# Patient Record
Sex: Female | Born: 1945 | Race: White | Hispanic: No | Smoking: Former smoker
Health system: Southern US, Community
[De-identification: ages and names within clinical notes are randomized; demographics above are authoritative.]

## PROBLEM LIST (undated history)

## (undated) DIAGNOSIS — E119 Type 2 diabetes mellitus without complications: Secondary | ICD-10-CM

## (undated) DIAGNOSIS — F419 Anxiety disorder, unspecified: Secondary | ICD-10-CM

## (undated) DIAGNOSIS — R6250 Unspecified lack of expected normal physiological development in childhood: Secondary | ICD-10-CM

## (undated) DIAGNOSIS — I10 Essential (primary) hypertension: Secondary | ICD-10-CM

## (undated) DIAGNOSIS — Z862 Personal history of diseases of the blood and blood-forming organs and certain disorders involving the immune mechanism: Secondary | ICD-10-CM

## (undated) DIAGNOSIS — K219 Gastro-esophageal reflux disease without esophagitis: Secondary | ICD-10-CM

## (undated) DIAGNOSIS — I9789 Other postprocedural complications and disorders of the circulatory system, not elsewhere classified: Secondary | ICD-10-CM

## (undated) DIAGNOSIS — C20 Malignant neoplasm of rectum: Secondary | ICD-10-CM

## (undated) DIAGNOSIS — I4891 Unspecified atrial fibrillation: Secondary | ICD-10-CM

## (undated) DIAGNOSIS — Z9889 Other specified postprocedural states: Secondary | ICD-10-CM

## (undated) DIAGNOSIS — I739 Peripheral vascular disease, unspecified: Secondary | ICD-10-CM

## (undated) DIAGNOSIS — Z951 Presence of aortocoronary bypass graft: Secondary | ICD-10-CM

## (undated) DIAGNOSIS — Z952 Presence of prosthetic heart valve: Secondary | ICD-10-CM

## (undated) HISTORY — DX: Malignant neoplasm of rectum: C20

## (undated) HISTORY — DX: Essential (primary) hypertension: I10

## (undated) HISTORY — DX: Personal history of diseases of the blood and blood-forming organs and certain disorders involving the immune mechanism: Z86.2

## (undated) HISTORY — DX: Type 2 diabetes mellitus without complications: E11.9

## (undated) HISTORY — PX: ABDOMINAL HYSTERECTOMY: SHX81

## (undated) HISTORY — DX: Unspecified atrial fibrillation: I48.91

## (undated) HISTORY — DX: Other postprocedural complications and disorders of the circulatory system, not elsewhere classified: I97.89

## (undated) HISTORY — PX: COLONOSCOPY W/ BIOPSIES AND POLYPECTOMY: SHX1376

## (undated) HISTORY — DX: Other specified postprocedural states: Z98.890

---

## 2009-04-14 HISTORY — PX: LOW ANTERIOR BOWEL RESECTION: SUR1240

## 2009-07-20 ENCOUNTER — Ambulatory Visit: Admission: RE | Admit: 2009-07-20 | Discharge: 2009-09-25 | Payer: Self-pay | Admitting: Radiation Oncology

## 2011-07-03 ENCOUNTER — Inpatient Hospital Stay (HOSPITAL_COMMUNITY)
Admission: AD | Admit: 2011-07-03 | Discharge: 2011-07-16 | DRG: 216 | Disposition: A | Discharge status: Skilled Nursing Facility | Payer: Medicare Other | Source: Other Acute Inpatient Hospital | Attending: Thoracic Surgery (Cardiothoracic Vascular Surgery) | Admitting: Thoracic Surgery (Cardiothoracic Vascular Surgery)

## 2011-07-03 ENCOUNTER — Other Ambulatory Visit: Payer: Self-pay

## 2011-07-03 ENCOUNTER — Encounter (HOSPITAL_COMMUNITY): Payer: Self-pay | Admitting: General Practice

## 2011-07-03 DIAGNOSIS — I214 Non-ST elevation (NSTEMI) myocardial infarction: Principal | ICD-10-CM | POA: Diagnosis present

## 2011-07-03 DIAGNOSIS — D62 Acute posthemorrhagic anemia: Secondary | ICD-10-CM | POA: Diagnosis not present

## 2011-07-03 DIAGNOSIS — K219 Gastro-esophageal reflux disease without esophagitis: Secondary | ICD-10-CM | POA: Diagnosis present

## 2011-07-03 DIAGNOSIS — F411 Generalized anxiety disorder: Secondary | ICD-10-CM | POA: Diagnosis present

## 2011-07-03 DIAGNOSIS — I2510 Atherosclerotic heart disease of native coronary artery without angina pectoris: Secondary | ICD-10-CM | POA: Diagnosis present

## 2011-07-03 DIAGNOSIS — I4891 Unspecified atrial fibrillation: Secondary | ICD-10-CM

## 2011-07-03 DIAGNOSIS — Z7982 Long term (current) use of aspirin: Secondary | ICD-10-CM

## 2011-07-03 DIAGNOSIS — Q2579 Other congenital malformations of pulmonary artery: Secondary | ICD-10-CM

## 2011-07-03 DIAGNOSIS — I10 Essential (primary) hypertension: Secondary | ICD-10-CM | POA: Diagnosis present

## 2011-07-03 DIAGNOSIS — D649 Anemia, unspecified: Secondary | ICD-10-CM | POA: Diagnosis present

## 2011-07-03 DIAGNOSIS — E785 Hyperlipidemia, unspecified: Secondary | ICD-10-CM | POA: Diagnosis present

## 2011-07-03 DIAGNOSIS — E8779 Other fluid overload: Secondary | ICD-10-CM | POA: Diagnosis not present

## 2011-07-03 DIAGNOSIS — I359 Nonrheumatic aortic valve disorder, unspecified: Secondary | ICD-10-CM | POA: Diagnosis present

## 2011-07-03 DIAGNOSIS — Z87891 Personal history of nicotine dependence: Secondary | ICD-10-CM

## 2011-07-03 DIAGNOSIS — Z952 Presence of prosthetic heart valve: Secondary | ICD-10-CM

## 2011-07-03 DIAGNOSIS — E119 Type 2 diabetes mellitus without complications: Secondary | ICD-10-CM | POA: Diagnosis present

## 2011-07-03 DIAGNOSIS — Z79899 Other long term (current) drug therapy: Secondary | ICD-10-CM

## 2011-07-03 DIAGNOSIS — E669 Obesity, unspecified: Secondary | ICD-10-CM | POA: Diagnosis present

## 2011-07-03 DIAGNOSIS — Q288 Other specified congenital malformations of circulatory system: Secondary | ICD-10-CM

## 2011-07-03 DIAGNOSIS — Z7901 Long term (current) use of anticoagulants: Secondary | ICD-10-CM

## 2011-07-03 DIAGNOSIS — Z951 Presence of aortocoronary bypass graft: Secondary | ICD-10-CM

## 2011-07-03 HISTORY — DX: Presence of aortocoronary bypass graft: Z95.1

## 2011-07-03 HISTORY — DX: Gastro-esophageal reflux disease without esophagitis: K21.9

## 2011-07-03 HISTORY — DX: Peripheral vascular disease, unspecified: I73.9

## 2011-07-03 HISTORY — DX: Presence of prosthetic heart valve: Z95.2

## 2011-07-03 HISTORY — DX: Anxiety disorder, unspecified: F41.9

## 2011-07-03 HISTORY — DX: Unspecified lack of expected normal physiological development in childhood: R62.50

## 2011-07-03 LAB — COMPREHENSIVE METABOLIC PANEL
AST: 28 U/L (ref 0–37)
BUN: 15 mg/dL (ref 6–23)
CO2: 26 mEq/L (ref 19–32)
Chloride: 105 mEq/L (ref 96–112)
Creatinine, Ser: 1.11 mg/dL — ABNORMAL HIGH (ref 0.50–1.10)
GFR calc non Af Amer: 50 mL/min — ABNORMAL LOW (ref >90)
Glucose, Bld: 124 mg/dL — ABNORMAL HIGH (ref 70–99)
Total Bilirubin: 0.3 mg/dL (ref 0.3–1.2)

## 2011-07-03 LAB — CBC
HCT: 32.3 % — ABNORMAL LOW (ref 36.0–46.0)
Hemoglobin: 10.7 g/dL — ABNORMAL LOW (ref 12.0–15.0)
MCV: 94.7 fL (ref 78.0–100.0)
Platelets: 191 10*3/uL (ref 150–400)
RBC: 3.41 MIL/uL — ABNORMAL LOW (ref 3.87–5.11)
WBC: 6.2 10*3/uL (ref 4.0–10.5)

## 2011-07-03 LAB — CARDIAC PANEL(CRET KIN+CKTOT+MB+TROPI)
CK, MB: 7 ng/mL (ref 0.3–4.0)
Total CK: 138 U/L (ref 7–177)

## 2011-07-03 LAB — DIFFERENTIAL
Basophils Absolute: 0 10*3/uL (ref 0.0–0.1)
Basophils Relative: 0 % (ref 0–1)
Monocytes Absolute: 0.4 10*3/uL (ref 0.1–1.0)
Neutro Abs: 4.7 10*3/uL (ref 1.7–7.7)

## 2011-07-03 LAB — TSH: TSH: 2.755 u[IU]/mL (ref 0.350–4.500)

## 2011-07-03 LAB — GLUCOSE, CAPILLARY: Glucose-Capillary: 179 mg/dL — ABNORMAL HIGH (ref 70–99)

## 2011-07-03 LAB — APTT: aPTT: 32 seconds (ref 24–37)

## 2011-07-03 MED ORDER — ASPIRIN 300 MG RE SUPP: 300.0000 mg | RECTAL | Status: DC

## 2011-07-03 MED ORDER — FERROUS SULFATE 325 (65 FE) MG PO TABS
325.0000 mg | ORAL_TABLET | Freq: Every day | ORAL | Status: DC
  Administered 2011-07-03 – 2011-07-04 (×2): 325 mg via ORAL
  Filled 2011-07-03 (×3): qty 1

## 2011-07-03 MED ORDER — SODIUM CHLORIDE 0.9 % IJ SOLN: 3.0000 mL | INTRAMUSCULAR | Status: DC | PRN

## 2011-07-03 MED ORDER — BUMETANIDE 2 MG PO TABS
2.0000 mg | ORAL_TABLET | Freq: Every day | ORAL | Status: DC
  Administered 2011-07-03: 2 mg via ORAL
  Filled 2011-07-03 (×2): qty 1

## 2011-07-03 MED ORDER — SODIUM CHLORIDE 0.9 % IJ SOLN
3.0000 mL | Freq: Two times a day (BID) | INTRAMUSCULAR | Status: DC
  Administered 2011-07-04: 3 mL via INTRAVENOUS

## 2011-07-03 MED ORDER — HEPARIN (PORCINE) IN NACL 100-0.45 UNIT/ML-% IJ SOLN
1000.0000 [IU]/h | INTRAMUSCULAR | Status: DC
  Administered 2011-07-03 – 2011-07-04 (×3): 1000 [IU]/h via INTRAVENOUS
  Filled 2011-07-03 (×3): qty 250

## 2011-07-03 MED ORDER — VITAMIN D (ERGOCALCIFEROL) 1.25 MG (50000 UNIT) PO CAPS: 50000.0000 [IU] | ORAL_CAPSULE | ORAL | Status: DC

## 2011-07-03 MED ORDER — NITROGLYCERIN 0.4 MG SL SUBL: 0.4000 mg | SUBLINGUAL_TABLET | SUBLINGUAL | Status: DC | PRN

## 2011-07-03 MED ORDER — POTASSIUM CHLORIDE CRYS ER 10 MEQ PO TBCR
10.0000 meq | EXTENDED_RELEASE_TABLET | Freq: Every day | ORAL | Status: DC
  Administered 2011-07-03 – 2011-07-04 (×2): 10 meq via ORAL
  Filled 2011-07-03 (×2): qty 1

## 2011-07-03 MED ORDER — ONDANSETRON HCL 4 MG/2ML IJ SOLN: 4.0000 mg | Freq: Four times a day (QID) | INTRAMUSCULAR | Status: DC | PRN

## 2011-07-03 MED ORDER — NITROGLYCERIN IN D5W 200-5 MCG/ML-% IV SOLN
10.0000 ug/min | INTRAVENOUS | Status: DC
  Administered 2011-07-03: 10 ug/min via INTRAVENOUS
  Filled 2011-07-03: qty 250

## 2011-07-03 MED ORDER — SODIUM CHLORIDE 0.9 % IV SOLN: 250.0000 mL | INTRAVENOUS | Status: DC | PRN

## 2011-07-03 MED ORDER — METOPROLOL TARTRATE 25 MG PO TABS
25.0000 mg | ORAL_TABLET | Freq: Four times a day (QID) | ORAL | Status: DC
  Administered 2011-07-03 – 2011-07-04 (×4): 25 mg via ORAL
  Filled 2011-07-03 (×8): qty 1

## 2011-07-03 MED ORDER — INSULIN ASPART 100 UNIT/ML ~~LOC~~ SOLN
0.0000 [IU] | Freq: Three times a day (TID) | SUBCUTANEOUS | Status: DC
  Administered 2011-07-03: 3 [IU] via SUBCUTANEOUS
  Administered 2011-07-04: 2 [IU] via SUBCUTANEOUS

## 2011-07-03 MED ORDER — RALOXIFENE HCL 60 MG PO TABS
60.0000 mg | ORAL_TABLET | Freq: Every day | ORAL | Status: DC
  Administered 2011-07-03 – 2011-07-04 (×2): 60 mg via ORAL
  Filled 2011-07-03 (×2): qty 1

## 2011-07-03 MED ORDER — SODIUM CHLORIDE 0.45 % IV SOLN
INTRAVENOUS | Status: DC
  Administered 2011-07-03: 12:00:00 via INTRAVENOUS

## 2011-07-03 MED ORDER — ASPIRIN EC 81 MG PO TBEC
81.0000 mg | DELAYED_RELEASE_TABLET | Freq: Every day | ORAL | Status: DC
  Filled 2011-07-03: qty 1

## 2011-07-03 MED ORDER — SODIUM CHLORIDE 0.45 % IV SOLN
INTRAVENOUS | Status: DC
  Administered 2011-07-04: 03:00:00 via INTRAVENOUS

## 2011-07-03 MED ORDER — ACETAMINOPHEN 325 MG PO TABS: 650.0000 mg | ORAL_TABLET | ORAL | Status: DC | PRN

## 2011-07-03 MED ORDER — HEPARIN BOLUS VIA INFUSION
4000.0000 [IU] | Freq: Once | INTRAVENOUS | Status: AC
  Administered 2011-07-03: 4000 [IU] via INTRAVENOUS
  Filled 2011-07-03: qty 4000

## 2011-07-03 MED ORDER — GLIPIZIDE ER 10 MG PO TB24
10.0000 mg | ORAL_TABLET | Freq: Two times a day (BID) | ORAL | Status: DC
  Administered 2011-07-03: 10 mg via ORAL
  Filled 2011-07-03 (×4): qty 1

## 2011-07-03 MED ORDER — ASPIRIN 81 MG PO CHEW
324.0000 mg | CHEWABLE_TABLET | ORAL | Status: AC
  Administered 2011-07-04: 324 mg via ORAL
  Filled 2011-07-03: qty 4

## 2011-07-03 MED ORDER — DIAZEPAM 5 MG PO TABS
5.0000 mg | ORAL_TABLET | ORAL | Status: AC
  Administered 2011-07-04: 5 mg via ORAL
  Filled 2011-07-03: qty 1

## 2011-07-03 MED ORDER — SODIUM CHLORIDE 0.9 % IV SOLN
INTRAVENOUS | Status: DC
  Administered 2011-07-03: 12:00:00 via INTRAVENOUS

## 2011-07-03 MED ORDER — INSULIN ASPART 100 UNIT/ML ~~LOC~~ SOLN: 0.0000 [IU] | Freq: Every day | SUBCUTANEOUS | Status: DC

## 2011-07-03 MED ORDER — ASPIRIN 81 MG PO CHEW
324.0000 mg | CHEWABLE_TABLET | ORAL | Status: AC
  Administered 2011-07-03: 324 mg via ORAL
  Filled 2011-07-03: qty 4

## 2011-07-03 NOTE — Progress Notes (Signed)
RN called  office again and notified them of patient being on unit and needing orders.  Will continue to monitor. Melinda Vang

## 2011-07-03 NOTE — Progress Notes (Signed)
Pt and family in room. Discussed the risks and benefits of a cardiac catheterization including, but not limited to, death, stroke, MI, kidney damage and bleeding were discussed with the patient and her family who indicate understanding and agree to proceed.

## 2011-07-03 NOTE — H&P (Signed)
History and Physical  Patient ID: Melinda Vang Patient ID: Melinda Vang MRN: [id number], DOB/AGE: 1945/05/19 66 y.o. Date of Encounter: 07/03/2011  Primary Physician: Melinda Vang., MD, MD Primary Cardiologist:   Chief Complaint:  CP. Transferred from [facility] for NSTEMI.   HPI:  Pt is a 66 yo caucasian female who began to experience chest pressure, substernal chest pain with radiation into R jaw and Bilateral UE that began Tuesday night. It was initially a 10/10. She was unable to define the chest pain further. She also had associated dyspnea and nausea with vomiting. No diarrhea. She did not call anyone for help. Her family came by to check on her yesterday. Family member states she did not, "look well," and the patient admitted she was not feeling well and told the family member she was having the chest pain.  The decision was then made to take her to the [facility] ED where she was found to have elevated CE and afib with RVR which responded to lopressor.    After the Lopressor, she spontaneously converted to SR. When her cardiac enzymes came back elevated, she was transferred to [facility] for further evaluation and treatment. Currently, she is pain-free and denies nausea. She has no current complaints.  Past Medical History  Diagnosis Date  . Heart murmur   . Angina   . Hypertension   . Diabetes mellitus   . Peripheral vascular disease     per family  . Shortness of breath   . Anemia     hx of anemia  . Cancer 2011    rectal cancer history  . GERD (gastroesophageal reflux disease)   . Gout     hx of   . Anxiety/ developmental delay      Past Surgical History  Procedure Date  . Abdominal hysterectomy   . Colonoscopy w/ biopsies and polypectomy    I have reviewed the patient's current medications. Prior to Admission:  Prescriptions prior to admission  Medication Sig Dispense Refill  . atenolol (TENORMIN) 50 MG tablet Take 50-75 mg by mouth daily. Monday,  Wednesday, Friday, Sunday-1.5 tablets Tuesday, Thursday, Saturday-1 tablet      . bumetanide (BUMEX) 1 MG tablet Take 2 mg by mouth daily.      . ferrous sulfate 325 (65 FE) MG tablet Take 325 mg by mouth daily with breakfast.      . glipiZIDE (GLUCOTROL XL) 10 MG 24 hr tablet Take 10 mg by mouth 2 (two) times daily.      . metFORMIN (GLUCOPHAGE) 1000 MG tablet Take 1,000 mg by mouth daily.      . potassium chloride (K-DUR,KLOR-CON) 10 MEQ tablet Take 10 mEq by mouth daily.      . raloxifene (EVISTA) 60 MG tablet Take 60 mg by mouth daily.      . Vitamin D, Ergocalciferol, (DRISDOL) 50000 UNITS CAPS Take 50,000 Units by mouth every 7 (seven) days. mondays       Scheduled:   . aspirin  324 mg Oral NOW  . aspirin EC  81 mg Oral Daily  . metoprolol tartrate  25 mg Oral QID  . DISCONTD: aspirin  300 mg Rectal NOW   Allergies: NKDA History   Social History  . Marital Status: Divorced    Spouse Name: N/A    Number of Children: N/A  . Years of Education: N/A   Occupational History  . Not on file.   Social History Main Topics  . Smoking status:  Former Smoker -- 1.0 packs/day for 20 years  . Smokeless tobacco: Never Used   Comment: Quit > 20 years ago  . Alcohol Use: No  . Drug Use: No  . Sexually Active: No   Other Topics Concern  . Not on file   Social History Narrative   Pt lives alone and family checks on her daily.     Family history: Both parents are deceased, no family history of premature coronary artery disease.  Review of Systems: Pt not able to answer questions very well. All history obtainable is listed in the [facility] above and the family does not report any other recent medical problems or issues. Her abdomen may be a little larger than usual. She has a history of non-compliance with meds and has not taken meds in the last few days.  Full 14-point review of systems otherwise negative except as noted above.   Physical Exam: Height 4\' 10"  (1.473 m), weight 172 lb 6.4  oz (78.2 kg). General: Pale, appears tired, well developed, obese, female in no acute distress. Head: Normocephalic, atraumatic, sclera non-icteric, no xanthomas, nares are without discharge. Dentition: poor Neck: Supple. No carotid bruits. JVD not elevated. No thyromegaly. Trachea midline, no cervical lymphadenopathy. Lungs: Diminshed, but clear bilaterally to auscultation without wheezes or rhonchi. Equal and bilateral expansion. Heart: Prominent systolic murmur, nearly obliterates 3/6 and loudest at the upper right sternal border. Regular rate and rhythm with S1 S2. No S3 or S4.  No rubs, or gallops appreciated. Abdomen: Obese, rotund, soft, non-tender, with normoactive bowel sounds. No hepatomegaly. No rebound/guarding. No obvious abdominal masses. Msk:  Strength and tone appear normal for age. No joint deformities or effusions, no spine or costo-vertebral angle tenderness. Extremities: 2+  edema to BLE with erythrema and warm to touch, R>L. No clubbing or cyanosis.  Distal pedal pulses are 2+ and equal bilaterally. Neuro: Lethargic, but arousable oriented X 3. Moves all extremities spontaneously. No focal deficits noted. Psych:  Responds to questions appropriately with a normal affect. Logical thoughts and normal tone and timbre of speech. Skin: Thin and fragile with the aforementioned BLE alterations.  Labs: pending    Radiology/Studies: done at [facility], venous congestion and left basilar atelectasis    Echo:  pending  EKG: done at [facility], SR, rate 72 with diffuse inferolateral ST depression and T wave changes.  ASSESSMENT AND PLAN:  1. NSTEMI: Admit, cont to cycle enzymes, give ASA, cont BB, add statin once lipid profile reviewed if LFTs OK. Cath today if labs stable.  2. DM - hold glucophage, add SSI, cont glucotrol, ck A1C  3. Possible cellulitis - no fever, WBC's pending. Follow for now, may need ABX.  4. Otherwise, continue home Rx except Bumex for now.   Signed,  Melinda Marker Barrett  PA-C 07/03/2011, 9:52 AM  Patient seen and examined.  Agree with findings as noted above by R. Vang.   66 year old with a history of DM now with CP that began 2 nights ago.  Currently pain free. Was in afib.  Now back in SR. Denies SOB  Denies CP. Exam with evid of LE edema, ? Early cellulitis (family says her legs are always red) EKGs from [facility] show Afib, ST changes consistent with ischemia.  Now in SR.    Labs signif for positive troponin, positive CK/MB consistent with NSTEMI  Impression  NSTEMI Began 36 hr ago.  Currently pain free I would continue heparin.  Continue b blocker.   Labs to be drawn  her to evaluate renal function.  WIll begin hydration Start heparin. Probable cath in AM to optimize, sooner if CP recurs  Echo to evaluate murmur.  Get old records from Dr. Woody Seller.  Dorris Carnes 1:27 PM 07/03/2011

## 2011-07-03 NOTE — Progress Notes (Signed)
CRITICAL VALUE ALERT  Critical value received:  CK-MB 7.0 & troponin 4.32  Date of notification: 07/03/11    Time of notification:  6:40 PM   Critical value read back:yes  Nurse who received alert:  Lina Sar   MD notified (1st page):  Merita Norton, PA  Time of first page:  6:40 PM   MD notified (2nd page):6:40 PM   Time of second page:  Responding MD:  Merita Norton, PA  Time MD responded:  6:40 PM

## 2011-07-03 NOTE — Progress Notes (Signed)
Patient arrived via EMS from [facility] and settled/stable in room.  [facility] notified of patients arrival.  Will continue to monitor. Lina Sar

## 2011-07-03 NOTE — Progress Notes (Signed)
ANTICOAGULATION CONSULT NOTE - Follow Up Consult  Pharmacy Consult for Heparin Indication: atrial fibrillation, NSTEMI  No Known Allergies  Patient Measurements: Height: 4\' 10"  (147.3 cm) Weight: 172 lb 6.4 oz (78.2 kg) IBW/kg (Calculated) : 40.9  Heparin Dosing Weight: 63 kg  Vital Signs: Temp: 98.9 F (37.2 C) (03/21 1433) Temp src: Axillary (03/21 1400) BP: 157/77 mmHg (03/21 1746) Pulse Rate: 85  (03/21 1746)  Labs:  Basename 07/03/11 2013 07/03/11 1729 07/03/11 1250  HGB -- -- 10.7*  HCT -- -- 32.3*  PLT -- -- 191  APTT -- -- 32  LABPROT -- -- 14.5  INR -- -- 1.11  HEPARINUNFRC 0.62 -- --  CREATININE -- -- 1.11*  CKTOTAL -- 138 --  CKMB -- 7.0* --  TROPONINI -- 4.32* --   Estimated Creatinine Clearance: 43.3 ml/min (by C-G formula based on Cr of 1.11).   Medications:  Infusions:    . sodium chloride 50 mL/hr at 07/03/11 1209  . sodium chloride    . heparin 1,000 Units/hr (07/03/11 1354)  . nitroGLYCERIN 10 mcg/min (07/03/11 1737)  . DISCONTD: sodium chloride 75 mL/hr at 07/03/11 1206    Assessment: Atrial Fibrillation, NSTEMI: Currently on Heparin with an initial therapeutic Heparin level.  Goal of Therapy:  Heparin level 0.3-0.7 units/ml   Plan:  Continue Heparin at 1000 units/hr Check AM Heparin level and CBC.  Legrand Como, Pharm.D., BCPS Clinical Pharmacist  Pager [phone] 07/03/2011, 9:06 PM

## 2011-07-03 NOTE — Progress Notes (Signed)
Pt C/O of CP after eating lunch.  VS &EKG obtained.  Pt unable to verbalize pain scale and descriptors. PA notified and will continue to monitor. Lina Sar

## 2011-07-03 NOTE — Progress Notes (Signed)
ANTICOAGULATION CONSULT NOTE - Initial Consult  Pharmacy Consult for Heparin Indication: Afib, NSTEMI  No Known Allergies  Patient Measurements: Height: 4\' 10"  (147.3 cm) Weight: 172 lb 6.4 oz (78.2 kg) IBW/kg (Calculated) : 40.9  Heparin Dosing Weight: 63 kg  Vital Signs: Temp: 98.8 F (37.1 C) (03/21 0730) Temp src: Axillary (03/21 0730) BP: 138/64 mmHg (03/21 0730) Pulse Rate: 81  (03/21 0730)  Labs: No results found for this basename: HGB:2,HCT:3,PLT:3,APTT:3,LABPROT:3,INR:3,HEPARINUNFRC:3,CREATININE:3,CKTOTAL:3,CKMB:3,TROPONINI:3 in the last 72 hours CrCl is unknown because no creatinine reading has been taken.  Medical History: Past Medical History  Diagnosis Date  . Heart murmur   . Angina   . Hypertension   . Diabetes mellitus   . Peripheral vascular disease     per family  . Shortness of breath   . Anemia     hx of anemia  . Cancer     rectal cancer history  . GERD (gastroesophageal reflux disease)   . Gout     hx of   . Anxiety   . Developmental delay     Assessment: Transferred from [facility] for NSTEMI and afib. Last LMWH noted around midnight.  Cards: heart murmur, angina, HTN, PVD Meds: ASA 81mg , Bumex, metoprolol, K  GI/Nutriton: h/o GERD Meds: FESO4, K, Evista, Vit D  Endo: DM, gout (hold glucophage) Meds: glipizide, SSI  Heme/Onc: h/o anemia and rectal cancer on FESO4  Neuro: anxiety, developmental delay  Goal of Therapy:  Heparin level 0.3-0.7 units/ml   Plan:  Start IV heparin with bolus 4000 units with infusion 1000 units/hr. Check heparin level in 6 hrs and daily.  Alford Highland, [employer] 07/03/2011,12:35 PM

## 2011-07-04 ENCOUNTER — Encounter (HOSPITAL_COMMUNITY): Payer: Self-pay | Admitting: Anesthesiology

## 2011-07-04 ENCOUNTER — Encounter (HOSPITAL_COMMUNITY)
Admission: AD | Disposition: A | Discharge status: Skilled Nursing Facility | Payer: Self-pay | Source: Other Acute Inpatient Hospital | Attending: Thoracic Surgery (Cardiothoracic Vascular Surgery)

## 2011-07-04 ENCOUNTER — Encounter (HOSPITAL_COMMUNITY): Payer: Self-pay | Admitting: Thoracic Surgery (Cardiothoracic Vascular Surgery)

## 2011-07-04 ENCOUNTER — Inpatient Hospital Stay (HOSPITAL_COMMUNITY): Payer: Medicare Other | Admitting: Anesthesiology

## 2011-07-04 ENCOUNTER — Encounter (HOSPITAL_COMMUNITY): Payer: Self-pay | Admitting: Certified Registered"

## 2011-07-04 DIAGNOSIS — I4891 Unspecified atrial fibrillation: Secondary | ICD-10-CM

## 2011-07-04 DIAGNOSIS — I2510 Atherosclerotic heart disease of native coronary artery without angina pectoris: Secondary | ICD-10-CM

## 2011-07-04 DIAGNOSIS — E785 Hyperlipidemia, unspecified: Secondary | ICD-10-CM | POA: Diagnosis present

## 2011-07-04 DIAGNOSIS — Z951 Presence of aortocoronary bypass graft: Secondary | ICD-10-CM

## 2011-07-04 DIAGNOSIS — I359 Nonrheumatic aortic valve disorder, unspecified: Secondary | ICD-10-CM

## 2011-07-04 DIAGNOSIS — D649 Anemia, unspecified: Secondary | ICD-10-CM | POA: Diagnosis present

## 2011-07-04 HISTORY — PX: MYOMECTOMY: SHX85

## 2011-07-04 HISTORY — DX: Presence of aortocoronary bypass graft: Z95.1

## 2011-07-04 HISTORY — PX: LEFT HEART CATHETERIZATION WITH CORONARY ANGIOGRAM: SHX5451

## 2011-07-04 HISTORY — PX: CORONARY ARTERY BYPASS GRAFT: SHX141

## 2011-07-04 HISTORY — PX: AORTIC VALVE REPLACEMENT: SHX41

## 2011-07-04 LAB — POCT I-STAT 4, (NA,K, GLUC, HGB,HCT)
Glucose, Bld: 114 mg/dL — ABNORMAL HIGH (ref 70–99)
Glucose, Bld: 131 mg/dL — ABNORMAL HIGH (ref 70–99)
Glucose, Bld: 228 mg/dL — ABNORMAL HIGH (ref 70–99)
HCT: 27 % — ABNORMAL LOW (ref 36.0–46.0)
HCT: 21 % — ABNORMAL LOW (ref 36.0–46.0)
HCT: 21 % — ABNORMAL LOW (ref 36.0–46.0)
Hemoglobin: 7.5 g/dL — ABNORMAL LOW (ref 12.0–15.0)
Hemoglobin: 7.1 g/dL — ABNORMAL LOW (ref 12.0–15.0)
Potassium: 3.5 mEq/L (ref 3.5–5.1)
Potassium: 3.5 mEq/L (ref 3.5–5.1)
Potassium: 4.3 mEq/L (ref 3.5–5.1)
Sodium: 139 mEq/L (ref 135–145)
Sodium: 139 mEq/L (ref 135–145)
Sodium: 137 mEq/L (ref 135–145)
Sodium: 139 mEq/L (ref 135–145)

## 2011-07-04 LAB — CBC
Hemoglobin: 10.5 g/dL — ABNORMAL LOW (ref 12.0–15.0)
MCHC: 34.2 g/dL (ref 30.0–36.0)
Platelets: 184 10*3/uL (ref 150–400)
Platelets: 99 10*3/uL — ABNORMAL LOW (ref 150–400)
RBC: 3.33 MIL/uL — ABNORMAL LOW (ref 3.87–5.11)
RDW: 14 % (ref 11.5–15.5)
WBC: 6.8 10*3/uL (ref 4.0–10.5)

## 2011-07-04 LAB — LIPID PANEL
LDL Cholesterol: 101 mg/dL — ABNORMAL HIGH (ref 0–99)
Triglycerides: 257 mg/dL — ABNORMAL HIGH (ref <150)
VLDL: 51 mg/dL — ABNORMAL HIGH (ref 0–40)

## 2011-07-04 LAB — CARDIAC PANEL(CRET KIN+CKTOT+MB+TROPI)
CK, MB: 2.7 ng/mL (ref 0.3–4.0)
Relative Index: INVALID (ref 0.0–2.5)
Total CK: 79 U/L (ref 7–177)
Troponin I: 2.44 ng/mL (ref <0.30)
Troponin I: 1.88 ng/mL (ref <0.30)

## 2011-07-04 LAB — HEMOGLOBIN AND HEMATOCRIT, BLOOD
HCT: 24.9 % — ABNORMAL LOW (ref 36.0–46.0)
Hemoglobin: 8.4 g/dL — ABNORMAL LOW (ref 12.0–15.0)

## 2011-07-04 LAB — POCT I-STAT 3, ART BLOOD GAS (G3+)
Bicarbonate: 24.7 mEq/L — ABNORMAL HIGH (ref 20.0–24.0)
TCO2: 26 mmol/L (ref 0–100)
pH, Arterial: 7.385 (ref 7.350–7.400)

## 2011-07-04 LAB — DIC (DISSEMINATED INTRAVASCULAR COAGULATION)PANEL
INR: 1.6 — ABNORMAL HIGH (ref 0.00–1.49)
Prothrombin Time: 19.3 seconds — ABNORMAL HIGH (ref 11.6–15.2)

## 2011-07-04 LAB — IRON AND TIBC
Saturation Ratios: 18 % — ABNORMAL LOW (ref 20–55)
UIBC: 217 ug/dL (ref 125–400)

## 2011-07-04 LAB — PLATELET COUNT: Platelets: 83 10*3/uL — ABNORMAL LOW (ref 150–400)

## 2011-07-04 LAB — ABO/RH: ABO/RH(D): O POS

## 2011-07-04 LAB — RETICULOCYTES
RBC.: 3.48 MIL/uL — ABNORMAL LOW (ref 3.87–5.11)
Retic Count, Absolute: 83.5 10*3/uL (ref 19.0–186.0)

## 2011-07-04 LAB — GLUCOSE, CAPILLARY: Glucose-Capillary: 150 mg/dL — ABNORMAL HIGH (ref 70–99)

## 2011-07-04 LAB — FERRITIN: Ferritin: 75 ng/mL (ref 10–291)

## 2011-07-04 SURGERY — CORONARY ARTERY BYPASS GRAFTING (CABG)
Anesthesia: General | Site: Chest | Wound class: Clean

## 2011-07-04 SURGERY — LEFT HEART CATHETERIZATION WITH CORONARY ANGIOGRAM
Anesthesia: LOCAL

## 2011-07-04 MED ORDER — NITROGLYCERIN IN D5W 200-5 MCG/ML-% IV SOLN
INTRAVENOUS | Status: DC | PRN
  Administered 2011-07-04: 16.6 ug/min via INTRAVENOUS

## 2011-07-04 MED ORDER — VECURONIUM BROMIDE 10 MG IV SOLR
INTRAVENOUS | Status: DC | PRN
  Administered 2011-07-04: 10 mg via INTRAVENOUS

## 2011-07-04 MED ORDER — DEXTROSE 5 % IV SOLN
1.5000 g | INTRAVENOUS | Status: DC
  Administered 2011-07-04: 1.5 g via INTRAVENOUS
  Filled 2011-07-04: qty 1.5

## 2011-07-04 MED ORDER — DOPAMINE-DEXTROSE 3.2-5 MG/ML-% IV SOLN
INTRAVENOUS | Status: DC | PRN
  Administered 2011-07-04: 5 ug/kg/min via INTRAVENOUS

## 2011-07-04 MED ORDER — HEPARIN (PORCINE) IN NACL 2-0.9 UNIT/ML-% IJ SOLN
INTRAMUSCULAR | Status: AC
  Filled 2011-07-04: qty 2000

## 2011-07-04 MED ORDER — MIDAZOLAM HCL 2 MG/2ML IJ SOLN
INTRAMUSCULAR | Status: AC
  Filled 2011-07-04: qty 2

## 2011-07-04 MED ORDER — SODIUM CHLORIDE 0.9 % IV SOLN
0.1000 ug/kg/h | INTRAVENOUS | Status: DC
  Filled 2011-07-04 (×2): qty 4

## 2011-07-04 MED ORDER — FENTANYL CITRATE 0.05 MG/ML IJ SOLN
INTRAMUSCULAR | Status: DC | PRN
  Administered 2011-07-04 (×2): 250 ug via INTRAVENOUS
  Administered 2011-07-04: 150 ug via INTRAVENOUS
  Administered 2011-07-04: 250 ug via INTRAVENOUS
  Administered 2011-07-04: 650 ug via INTRAVENOUS
  Administered 2011-07-04: 250 ug via INTRAVENOUS
  Administered 2011-07-04: 100 ug via INTRAVENOUS
  Administered 2011-07-04: 250 ug via INTRAVENOUS
  Administered 2011-07-04: 150 ug via INTRAVENOUS
  Administered 2011-07-04: 100 ug via INTRAVENOUS
  Administered 2011-07-04: 50 ug via INTRAVENOUS
  Administered 2011-07-04: 250 ug via INTRAVENOUS
  Administered 2011-07-04: 50 ug via INTRAVENOUS
  Administered 2011-07-04: 250 ug via INTRAVENOUS

## 2011-07-04 MED ORDER — AMIODARONE HCL IN DEXTROSE 360-4.14 MG/200ML-% IV SOLN
INTRAVENOUS | Status: DC | PRN
  Administered 2011-07-04: 60 mg/h via INTRAVENOUS

## 2011-07-04 MED ORDER — VERAPAMIL HCL 2.5 MG/ML IV SOLN
INTRAVENOUS | Status: AC
  Administered 2011-07-04: 16:00:00
  Filled 2011-07-04: qty 2.5

## 2011-07-04 MED ORDER — VERAPAMIL HCL 2.5 MG/ML IV SOLN
INTRAVENOUS | Status: DC
  Filled 2011-07-04: qty 2.5

## 2011-07-04 MED ORDER — METOPROLOL TARTRATE 1 MG/ML IV SOLN
5.0000 mg | Freq: Once | INTRAVENOUS | Status: AC
  Administered 2011-07-04: 5 mg via INTRAVENOUS

## 2011-07-04 MED ORDER — VANCOMYCIN HCL 1000 MG IV SOLR
1250.0000 mg | INTRAVENOUS | Status: DC
  Administered 2011-07-04: 1250 mg via INTRAVENOUS
  Filled 2011-07-04: qty 1250

## 2011-07-04 MED ORDER — METOPROLOL TARTRATE 1 MG/ML IV SOLN
INTRAVENOUS | Status: AC
  Administered 2011-07-04: 5 mg via INTRAVENOUS
  Filled 2011-07-04: qty 5

## 2011-07-04 MED ORDER — METOPROLOL TARTRATE 1 MG/ML IV SOLN
5.0000 mg | Freq: Once | INTRAVENOUS | Status: AC
  Administered 2011-07-04: 5 mg via INTRAVENOUS
  Filled 2011-07-04: qty 5

## 2011-07-04 MED ORDER — DEXMEDETOMIDINE BOLUS VIA INFUSION: 1.0000 ug/kg | INTRAVENOUS | Status: DC

## 2011-07-04 MED ORDER — DOPAMINE-DEXTROSE 3.2-5 MG/ML-% IV SOLN
2.0000 ug/kg/min | INTRAVENOUS | Status: DC
  Filled 2011-07-04: qty 250

## 2011-07-04 MED ORDER — ATORVASTATIN CALCIUM 20 MG PO TABS
20.0000 mg | ORAL_TABLET | Freq: Every day | ORAL | Status: DC
  Administered 2011-07-06 – 2011-07-13 (×8): 20 mg via ORAL
  Filled 2011-07-04 (×13): qty 1

## 2011-07-04 MED ORDER — POTASSIUM CHLORIDE 2 MEQ/ML IV SOLN
80.0000 meq | INTRAVENOUS | Status: DC
  Filled 2011-07-04: qty 40

## 2011-07-04 MED ORDER — EPINEPHRINE HCL 1 MG/ML IJ SOLN
0.5000 ug/min | INTRAVENOUS | Status: DC
  Filled 2011-07-04: qty 4

## 2011-07-04 MED ORDER — ROCURONIUM BROMIDE 100 MG/10ML IV SOLN
INTRAVENOUS | Status: DC | PRN
  Administered 2011-07-04: 50 mg via INTRAVENOUS

## 2011-07-04 MED ORDER — FENTANYL CITRATE 0.05 MG/ML IJ SOLN
INTRAMUSCULAR | Status: AC
  Filled 2011-07-04: qty 2

## 2011-07-04 MED ORDER — PROPOFOL 10 MG/ML IV EMUL
INTRAVENOUS | Status: DC | PRN
  Administered 2011-07-04: 10 mg via INTRAVENOUS

## 2011-07-04 MED ORDER — DEXTROSE 5 % IV SOLN
150.0000 mg | INTRAVENOUS | Status: DC | PRN
  Administered 2011-07-04: 150 mg via INTRAVENOUS

## 2011-07-04 MED ORDER — LACTATED RINGERS IV SOLN
INTRAVENOUS | Status: DC | PRN
  Administered 2011-07-04 (×3): via INTRAVENOUS

## 2011-07-04 MED ORDER — DEXTROSE 5 % IV SOLN
750.0000 mg | INTRAVENOUS | Status: DC
  Filled 2011-07-04: qty 750

## 2011-07-04 MED ORDER — DEXTROSE 5 % IV SOLN
0.7500 g | INTRAVENOUS | Status: DC | PRN
  Administered 2011-07-04: .75 g via INTRAVENOUS

## 2011-07-04 MED ORDER — ALBUMIN HUMAN 5 % IV SOLN
INTRAVENOUS | Status: DC | PRN
  Administered 2011-07-04: 23:00:00 via INTRAVENOUS

## 2011-07-04 MED ORDER — HEPARIN SODIUM (PORCINE) 1000 UNIT/ML IJ SOLN
INTRAMUSCULAR | Status: DC | PRN
  Administered 2011-07-04: 28000 [IU] via INTRAVENOUS

## 2011-07-04 MED ORDER — 0.9 % SODIUM CHLORIDE (POUR BTL) OPTIME
TOPICAL | Status: DC | PRN
  Administered 2011-07-04: 5000 mL

## 2011-07-04 MED ORDER — PHENYLEPHRINE HCL 10 MG/ML IJ SOLN
30.0000 ug/min | INTRAVENOUS | Status: DC
  Filled 2011-07-04: qty 2

## 2011-07-04 MED ORDER — AMIODARONE HCL IN DEXTROSE 360-4.14 MG/200ML-% IV SOLN
60.0000 mg/h | INTRAVENOUS | Status: DC
  Filled 2011-07-04: qty 200

## 2011-07-04 MED ORDER — LIDOCAINE HCL (PF) 1 % IJ SOLN
INTRAMUSCULAR | Status: AC
  Filled 2011-07-04: qty 30

## 2011-07-04 MED ORDER — DOBUTAMINE IN D5W 4-5 MG/ML-% IV SOLN
2.5000 ug/kg/min | INTRAVENOUS | Status: DC
  Filled 2011-07-04: qty 250

## 2011-07-04 MED ORDER — SODIUM CHLORIDE 0.9 % IV SOLN
INTRAVENOUS | Status: DC
  Filled 2011-07-04: qty 1

## 2011-07-04 MED ORDER — MAGNESIUM SULFATE 50 % IJ SOLN
40.0000 meq | INTRAMUSCULAR | Status: DC
  Filled 2011-07-04: qty 10

## 2011-07-04 MED ORDER — PHENYLEPHRINE HCL 10 MG/ML IJ SOLN
INTRAMUSCULAR | Status: DC | PRN
  Administered 2011-07-04: 40 ug via INTRAVENOUS

## 2011-07-04 MED ORDER — SODIUM CHLORIDE 0.9 % IV SOLN
INTRAVENOUS | Status: DC
  Filled 2011-07-04: qty 40

## 2011-07-04 MED ORDER — SODIUM CHLORIDE 0.9 % IV SOLN
100.0000 [IU] | INTRAVENOUS | Status: DC | PRN
  Administered 2011-07-04: 1.6 [IU]/h via INTRAVENOUS

## 2011-07-04 MED ORDER — PROTAMINE SULFATE 10 MG/ML IV SOLN
INTRAVENOUS | Status: DC | PRN
  Administered 2011-07-04: 260 mg via INTRAVENOUS

## 2011-07-04 MED ORDER — SODIUM CHLORIDE 0.9 % IV SOLN
INTRAVENOUS | Status: DC | PRN
  Administered 2011-07-04: 22:00:00 via INTRAVENOUS

## 2011-07-04 MED ORDER — SODIUM CHLORIDE 0.9 % IV SOLN
200.0000 ug | INTRAVENOUS | Status: DC | PRN
  Administered 2011-07-04: 0.2 ug/kg/h via INTRAVENOUS

## 2011-07-04 MED ORDER — SODIUM CHLORIDE 0.9 % IV SOLN
10.0000 g | INTRAVENOUS | Status: DC | PRN
  Administered 2011-07-04: 5 g/h via INTRAVENOUS

## 2011-07-04 MED ORDER — MIDAZOLAM HCL 5 MG/5ML IJ SOLN
INTRAMUSCULAR | Status: DC | PRN
  Administered 2011-07-04: 3 mg via INTRAVENOUS
  Administered 2011-07-04 (×2): 1 mg via INTRAVENOUS
  Administered 2011-07-04: 3 mg via INTRAVENOUS
  Administered 2011-07-04: 4 mg via INTRAVENOUS
  Administered 2011-07-04: 2 mg via INTRAVENOUS
  Administered 2011-07-04: 4 mg via INTRAVENOUS
  Administered 2011-07-04: 2 mg via INTRAVENOUS

## 2011-07-04 MED ORDER — NITROGLYCERIN IN D5W 200-5 MCG/ML-% IV SOLN
2.0000 ug/min | INTRAVENOUS | Status: DC
  Filled 2011-07-04: qty 250

## 2011-07-04 MED ORDER — SODIUM CHLORIDE 0.9 % IJ SOLN
OROMUCOSAL | Status: DC | PRN
  Administered 2011-07-04 (×7): via TOPICAL

## 2011-07-04 SURGICAL SUPPLY — 140 items
ADAPTER CARDIO PERF ANTE/RETRO (ADAPTER) ×2 IMPLANT
APPLIER CLIP 9.375 MED OPEN (MISCELLANEOUS)
APPLIER CLIP 9.375 SM OPEN (CLIP) ×2
ATTRACTOMAT 16X20 MAGNETIC DRP (DRAPES) ×2 IMPLANT
BAG DECANTER FOR FLEXI CONT (MISCELLANEOUS) ×2 IMPLANT
BANDAGE ELASTIC 4 VELCRO ST LF (GAUZE/BANDAGES/DRESSINGS) ×6 IMPLANT
BANDAGE ELASTIC 6 VELCRO ST LF (GAUZE/BANDAGES/DRESSINGS) ×6 IMPLANT
BANDAGE GAUZE ELAST BULKY 4 IN (GAUZE/BANDAGES/DRESSINGS) ×6 IMPLANT
BASKET HEART (ORDER IN 25'S) (MISCELLANEOUS) ×1
BASKET HEART (ORDER IN 25S) (MISCELLANEOUS) ×1 IMPLANT
BENZOIN TINCTURE PRP APPL 2/3 (GAUZE/BANDAGES/DRESSINGS) ×2 IMPLANT
BLADE STERNUM SYSTEM 6 (BLADE) IMPLANT
BLADE SURG 11 STRL SS (BLADE) ×2 IMPLANT
BLADE SURG ROTATE 9660 (MISCELLANEOUS) IMPLANT
BLANKET WARM CARDIAC ADLT BAIR (MISCELLANEOUS) ×2 IMPLANT
CANISTER SUCTION 2500CC (MISCELLANEOUS) ×2 IMPLANT
CANNULA GUNDRY RCSP 15FR (MISCELLANEOUS) ×2 IMPLANT
CATH CPB KIT OWEN (MISCELLANEOUS) ×2 IMPLANT
CATH HEART VENT LEFT (CATHETERS) ×1 IMPLANT
CATH ROBINSON RED A/P 18FR (CATHETERS) ×6 IMPLANT
CATH THORACIC 28FR (CATHETERS) IMPLANT
CATH THORACIC 28FR RT ANG (CATHETERS) IMPLANT
CATH THORACIC 36FR (CATHETERS) ×2 IMPLANT
CATH THORACIC 36FR RT ANG (CATHETERS) IMPLANT
CLIP APPLIE 9.375 MED OPEN (MISCELLANEOUS) IMPLANT
CLIP APPLIE 9.375 SM OPEN (CLIP) ×1 IMPLANT
CLIP FOGARTY SPRING 6M (CLIP) IMPLANT
CLIP RETRACTION 3.0MM CORONARY (MISCELLANEOUS) ×2 IMPLANT
CLIP TI MEDIUM 24 (CLIP) IMPLANT
CLIP TI MEDIUM 6 (CLIP) IMPLANT
CLIP TI WIDE RED SMALL 24 (CLIP) IMPLANT
CLIP TI WIDE RED SMALL 6 (CLIP) IMPLANT
CLOTH BEACON ORANGE TIMEOUT ST (SAFETY) ×2 IMPLANT
CONN ST 1/4X3/8  BEN (MISCELLANEOUS) ×3
CONN ST 1/4X3/8 BEN (MISCELLANEOUS) ×3 IMPLANT
CONN Y 3/8X3/8X3/8  BEN (MISCELLANEOUS)
CONN Y 3/8X3/8X3/8 BEN (MISCELLANEOUS) IMPLANT
CONT SPEC STER OR (MISCELLANEOUS) ×4 IMPLANT
COVER SURGICAL LIGHT HANDLE (MISCELLANEOUS) ×4 IMPLANT
CRADLE DONUT ADULT HEAD (MISCELLANEOUS) ×2 IMPLANT
DRAIN CHANNEL 28F RND 3/8 FF (WOUND CARE) ×4 IMPLANT
DRAIN CHANNEL 32F RND 10.7 FF (WOUND CARE) ×2 IMPLANT
DRAPE CARDIOVASCULAR INCISE (DRAPES) ×1
DRAPE SLUSH/WARMER DISC (DRAPES) ×2 IMPLANT
DRAPE SRG 135X102X78XABS (DRAPES) ×1 IMPLANT
DRSG COVADERM 4X14 (GAUZE/BANDAGES/DRESSINGS) ×2 IMPLANT
ELECT REM PT RETURN 9FT ADLT (ELECTROSURGICAL) ×4
ELECTRODE REM PT RTRN 9FT ADLT (ELECTROSURGICAL) ×2 IMPLANT
GLOVE BIO SURGEON STRL SZ 6 (GLOVE) ×4 IMPLANT
GLOVE BIO SURGEON STRL SZ 6.5 (GLOVE) ×8 IMPLANT
GLOVE BIO SURGEON STRL SZ7 (GLOVE) ×2 IMPLANT
GLOVE BIO SURGEON STRL SZ7.5 (GLOVE) IMPLANT
GLOVE BIOGEL PI IND STRL 6 (GLOVE) ×4 IMPLANT
GLOVE BIOGEL PI IND STRL 6.5 (GLOVE) ×5 IMPLANT
GLOVE BIOGEL PI IND STRL 7.0 (GLOVE) ×2 IMPLANT
GLOVE BIOGEL PI INDICATOR 6 (GLOVE) ×4
GLOVE BIOGEL PI INDICATOR 6.5 (GLOVE) ×5
GLOVE BIOGEL PI INDICATOR 7.0 (GLOVE) ×2
GLOVE EUDERMIC 7 POWDERFREE (GLOVE) IMPLANT
GLOVE ORTHO TXT STRL SZ7.5 (GLOVE) ×4 IMPLANT
GOWN STRL NON-REIN LRG LVL3 (GOWN DISPOSABLE) ×16 IMPLANT
HEART VENT LT CURVED (MISCELLANEOUS) ×2 IMPLANT
HEMOSTAT POWDER SURGIFOAM 1G (HEMOSTASIS) ×14 IMPLANT
INSERT FOGARTY 61MM (MISCELLANEOUS) IMPLANT
INSERT FOGARTY XLG (MISCELLANEOUS) ×2 IMPLANT
KIT BASIN OR (CUSTOM PROCEDURE TRAY) ×2 IMPLANT
KIT ROOM TURNOVER OR (KITS) ×2 IMPLANT
KIT SUCTION CATH 14FR (SUCTIONS) ×2 IMPLANT
KIT VASOVIEW W/TROCAR VH 2000 (KITS) ×2 IMPLANT
LEAD PACING MYOCARDI (MISCELLANEOUS) ×2 IMPLANT
LINE VENT (MISCELLANEOUS) ×4 IMPLANT
MARKER GRAFT CORONARY BYPASS (MISCELLANEOUS) ×6 IMPLANT
NS IRRIG 1000ML POUR BTL (IV SOLUTION) ×12 IMPLANT
PACK OPEN HEART (CUSTOM PROCEDURE TRAY) ×2 IMPLANT
PAD ARMBOARD 7.5X6 YLW CONV (MISCELLANEOUS) ×4 IMPLANT
PENCIL BUTTON HOLSTER BLD 10FT (ELECTRODE) ×2 IMPLANT
PUNCH AORTIC ROTATE 4.0MM (MISCELLANEOUS) IMPLANT
PUNCH AORTIC ROTATE 4.5MM 8IN (MISCELLANEOUS) IMPLANT
PUNCH AORTIC ROTATE 5MM 8IN (MISCELLANEOUS) IMPLANT
SET CARDIOPLEGIA MPS 5001102 (MISCELLANEOUS) ×2 IMPLANT
SET IRRIG TUBING LAPAROSCOPIC (IRRIGATION / IRRIGATOR) ×4 IMPLANT
SOLUTION ANTI FOG 6CC (MISCELLANEOUS) IMPLANT
SPONGE GAUZE 4X4 12PLY (GAUZE/BANDAGES/DRESSINGS) ×6 IMPLANT
SPONGE INTESTINAL PEANUT (DISPOSABLE) IMPLANT
SPONGE LAP 18X18 X RAY DECT (DISPOSABLE) ×4 IMPLANT
SPONGE LAP 4X18 X RAY DECT (DISPOSABLE) ×2 IMPLANT
STOPCOCK 4 WAY LG BORE MALE ST (IV SETS) IMPLANT
STRIP CLOSURE SKIN 1/2X4 (GAUZE/BANDAGES/DRESSINGS) ×2 IMPLANT
SUT BONE WAX W31G (SUTURE) ×2 IMPLANT
SUT ETHIBON 2 0 V 52N 30 (SUTURE) ×2 IMPLANT
SUT ETHIBOND 2 0 SH (SUTURE) ×1
SUT ETHIBOND 2 0 SH 36X2 (SUTURE) ×1 IMPLANT
SUT ETHIBOND X763 2 0 SH 1 (SUTURE) ×4 IMPLANT
SUT MNCRL AB 3-0 PS2 18 (SUTURE) ×4 IMPLANT
SUT MNCRL AB 4-0 PS2 18 (SUTURE) ×2 IMPLANT
SUT PDS AB 1 CTX 36 (SUTURE) ×4 IMPLANT
SUT PROLENE 2 0 SH DA (SUTURE) IMPLANT
SUT PROLENE 3 0 SH DA (SUTURE) ×14 IMPLANT
SUT PROLENE 3 0 SH1 36 (SUTURE) IMPLANT
SUT PROLENE 4 0 RB 1 (SUTURE) ×5
SUT PROLENE 4 0 SH DA (SUTURE) ×4 IMPLANT
SUT PROLENE 4-0 RB1 .5 CRCL 36 (SUTURE) ×5 IMPLANT
SUT PROLENE 5 0 C 1 36 (SUTURE) IMPLANT
SUT PROLENE 6 0 C 1 30 (SUTURE) ×8 IMPLANT
SUT PROLENE 6 0 CC (SUTURE) IMPLANT
SUT PROLENE 7 0 BV 1 (SUTURE) IMPLANT
SUT PROLENE 7 0 BV1 MDA (SUTURE) IMPLANT
SUT PROLENE 7.0 RB 3 (SUTURE) ×22 IMPLANT
SUT PROLENE 8 0 BV175 6 (SUTURE) ×4 IMPLANT
SUT PROLENE BLUE 7 0 (SUTURE) ×4 IMPLANT
SUT PROLENE POLY MONO (SUTURE) ×4 IMPLANT
SUT SILK  1 MH (SUTURE) ×2
SUT SILK 1 MH (SUTURE) ×2 IMPLANT
SUT SILK 2 0 SH CR/8 (SUTURE) IMPLANT
SUT SILK 3 0 SH CR/8 (SUTURE) IMPLANT
SUT STEEL 6MS V (SUTURE) IMPLANT
SUT STEEL STERNAL CCS#1 18IN (SUTURE) IMPLANT
SUT STEEL SZ 6 DBL 3X14 BALL (SUTURE) IMPLANT
SUT VIC AB 1 CTX 18 (SUTURE) ×2 IMPLANT
SUT VIC AB 1 CTX 36 (SUTURE) ×1
SUT VIC AB 1 CTX36XBRD ANBCTR (SUTURE) ×1 IMPLANT
SUT VIC AB 2-0 CT1 27 (SUTURE) ×1
SUT VIC AB 2-0 CT1 TAPERPNT 27 (SUTURE) ×1 IMPLANT
SUT VIC AB 2-0 CTX 27 (SUTURE) IMPLANT
SUT VIC AB 3-0 SH 27 (SUTURE)
SUT VIC AB 3-0 SH 27X BRD (SUTURE) IMPLANT
SUT VIC AB 3-0 X1 27 (SUTURE) IMPLANT
SUT VICRYL 4-0 PS2 18IN ABS (SUTURE) IMPLANT
SUTURE E-PAK OPEN HEART (SUTURE) ×2 IMPLANT
SYSTEM SAHARA CHEST DRAIN ATS (WOUND CARE) ×4 IMPLANT
TAPE CLOTH SURG 4X10 WHT LF (GAUZE/BANDAGES/DRESSINGS) ×6 IMPLANT
TOWEL OR 17X24 6PK STRL BLUE (TOWEL DISPOSABLE) ×4 IMPLANT
TOWEL OR 17X26 10 PK STRL BLUE (TOWEL DISPOSABLE) ×4 IMPLANT
TRAY FOLEY IC TEMP SENS 14FR (CATHETERS) ×2 IMPLANT
TUBE SUCT INTRACARD DLP 20F (MISCELLANEOUS) ×2 IMPLANT
TUBING INSUFFLATION 10FT LAP (TUBING) ×2 IMPLANT
UNDERPAD 30X30 INCONTINENT (UNDERPADS AND DIAPERS) ×2 IMPLANT
VALVE MAGNA EASE AORTIC 19MM (Prosthesis & Implant Heart) ×2 IMPLANT
VENT LEFT HEART 12002 (CATHETERS) ×2
WATER STERILE IRR 1000ML POUR (IV SOLUTION) ×4 IMPLANT

## 2011-07-04 NOTE — Op Note (Addendum)
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  Moderate Aortic Stenosis  Subvalvular Left Ventricular Outflow Tract Obstruction  Procedure:    Coronary Artery Bypass Grafting x 4   Saphenous Vein Graft to Distal Left Anterior Descending Coronary Artery  Saphenous Vein Graft to Left Posterior Descending Coronary Artery  Saphenous Vein Graft to Obtuse Marginal Branch and Sequential Saphenous Vein Graft to First Left Posterolateral Branch of Left Circumflex Coronary Artery  Endoscopic Vein Harvest from Bilateral Thighs   Aortic Valve Replacement  Edwards Magna Ease Pericardial Tissue Valve (size 79mm, model #3300TFX, serial [id number])   Septal Myomectomy   Surgeon: Valentina Gu. Roxy Manns, MD  Assistant: Suzzanne Cloud, PA-C  Anesthesia: Annye Asa, MD and Albertha Ghee, MD  Operative Findings:  Small aortic root with moderate aortic stenosis  Normal left ventricular systolic function  Moderate-severe left ventricular hypertrophy with significant diastolic dysfunction  Subvalvular left ventricular outflow tract obstruction  Poor quality left internal mammary artery conduit  Good quality saphenous vein conduit  Small calibur fair quality target vessels for grafting   BRIEF CLINICAL NOTE AND INDICATIONS FOR SURGERY  Patient is a 66 yo obese white female from [place], [place] with no previous history of CAD but risk factors including HTN, type II diabetes mellitus, and a previous history of tobacco abuse.  The patient describes a long history of exertional chest pain and SOB.  She also has a known history of a heart murmur but apparently has never been seen by a cardiologist.  Several days ago the patient developed rapidly accelerated symptoms with severe  chest pain occurring at rest with radiation to the jaw and both arms.  She ultimately presented to [facility] ED 07/02/2011 where she was noted to be in rapid atrial fibrillation which spontaneously converted to sinus rhythm.  Cardiac enzymes were elevated, prompting transfer to [facility] yesterday for management of acute non-STEMI.  She has remained pain-free since transfer and was taken for cath today by Dr Burt Knack.  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  Cardiothoracic surgical consultation was requested.  The patient and her family have been seen in consultation and counseled at length regarding the indications, risks and potential benefits of surgery.  Because of the critical nature of the left main disease, the patient and her family were counseled regarding the need to proceed directly to surgery with plans for intraoperative transesophageal echocardiogram to further assess whether or not significant valvular heart disease might be present.  All questions have been answered, and the patient provides full informed consent for the operation as described.     DETAILS OF THE OPERATIVE PROCEDURE  The patient is brought to the operating room on the above mentioned date and central monitoring was established by the anesthesia team including placement of Swan-Ganz catheter and radial arterial line. The patient is placed in the supine position on the operating table.  Intravenous antibiotics are administered. General endotracheal anesthesia is induced uneventfully. A Foley catheter is placed.  Baseline transesophageal echocardiogram was performed.  Findings were notable for Normal left ventricular systolic function with moderate to severe left ventricular hypertrophy. The aortic valve was tricuspid. There was moderate aortic stenosis with thickening and fibrosis in restricted leaflet motion of all 3 leaflets. The aortic valve was  quite  small as was the entire aortic root. The aortic annulus measured less than 19 mm in diameter. There was subvalvular left ventricular output tract obstruction with left ventricular outflow tract diameter measuring 17 mm in diameter. The peak and mean gradients across the valve were estimated 28 and 18 mm mercury respectively. By planimetry the valve area estimated 1.4 cm.  Intraoperative consultation was requested with Dr. Harrington Challenger who also came and examined the transesophageal echocardiogram. It was agreed that the patient should likely undergo concomitant aortic valve replacement. The patient not felt to be candidate for mechanical valve replacement nor long term coumadin therapy.  The patient's chest, abdomen, both groins, and both lower extremities are prepared and draped in a sterile manner. A time out procedure is performed.  A median sternotomy incision was performed and the left internal mammary artery is dissected from the chest wall over a short distance. The left internal mammary artery is notably very small caliber and poor quality conduit. Simultaneously, saphenous vein is obtained from both of the patient's thighs using endoscopic vein harvest technique. The saphenous vein is notably good quality conduit. Therefore the left internal mammary harvest was aborted.  After removal of the saphenous vein, the small surgical incisions in the lower extremity are closed with absorbable suture. Following systemic heparinization, the left internal mammary artery was transected distally noted to have excellent flow.  The pericardium is opened. The ascending aorta is normal in appearance. The ascending aorta and the right atrium are cannulated for cardioplegia bypass.  Adequate heparinization is verified.    A retrograde cardioplegia cannula is placed through the right atrium into the coronary sinus.   The entire pre-bypass portion of the operation was notable for stable hemodynamics.  Cardiopulmonary bypass  was begun and a left ventricular vent placed through the right superior pulmonary vein.  The surface of the heart inspected. Distal target vessels are selected for coronary artery bypass grafting. A cardioplegia cannula is placed in the ascending aorta.  A temperature probe was placed in the interventricular septum.  The patient is cooled to 32C systemic temperature.  The aortic cross clamp is applied and cold blood cardioplegia is delivered initially in an antegrade fashion through the aortic root.   Supplemental cardioplegia is given retrograde through the coronary sinus catheter.  Iced saline slush is applied for topical hypothermia.  The initial cardioplegic arrest is rapid with early diastolic arrest.  Repeat doses of cardioplegia are administered intermittently throughout the entire cross clamp portion of the operation through the aortic root, through the coronary sinus catheter, and through subsequently placed vein grafts in order to maintain completely flat electrocardiogram and septal myocardial temperature below 15C.  Myocardial protection was felt to be excellent.  The following distal coronary artery bypass grafts were performed:   The posterior descending branch of the left coronary artery was grafted using a reversed saphenous vein graft in an end-to-side fashion.  At the site of distal anastomosis the target vessel was fair quality and measured approximately 1.1 mm in diameter.  The obtuse marginal branch of the left circumflex coronary artery was grafted using a reversed saphenous vein graft in an side-to-side fashion.  At the site of distal anastomosis the target vessel was fair quality and measured approximately 1.2 mm in diameter.  The first posterolateral branch of the left circumflex coronary artery was grafted using a sequential saphenous vein graft in an side-to-side fashion off of the distal portion of the vein graft to the obtuse marginal branch.  At the site of distal  anastomosis the target vessel was fair to good quality and measured approximately 1.5 mm in diameter.  The distal left anterior coronary artery was grafted with a reversed saphenous vein graft in an end-to-side fashion.  At the site of distal anastomosis the target vessel was fair to good quality and measured approximately 1.4 mm in diameter.   An oblique transverse aortotomy incision was performed.  The aortic valve was inspected and notable for mild-moderate fibrosis and calcification of all three leaflets and moderate aortic stenosis.  The aortic valve leaflets were excised sharply and the aortic annulus decalcified.  Decalcification was notably straightforward.  The aortic annulus was sized to accept a 19 mm prosthesis.  The aortic root and left ventricle were irrigated with copious cold saline solution.  Septal myomectomy was performed using the technique originally described by Orland Mustard.  An 11 blade knife was used to remove an long rectangular portion of the interventricular septum beginning just beneath the mid portion of the annulus beneath the right coronary artery and completed just below the left-right commissure.  Aortic valve replacement was performed using interrupted horizontal mattress 2-0 Ethibond pledgeted sutures with pledgets in the subannular position.  An [facility] Ease pericardial tissue valve (size 19 mm, model # 3300TFX, serial # [id number]) was implanted uneventfully. The valve seated appropriately with adequate space beneath the left main and right coronary artery.  The aortotomy was closed using a 2-layer closure of running 4-0 Prolene suture.  All proximal vein graft anastomoses were placed directly to the ascending aorta prior to removal of the aortic cross clamp.  One final dose of warm retrograde "hot shot" cardioplegia was administered through the coronary sinus catheter while all air was evacuated through the aortic root.  The aortic cross clamp was removed after a  total cross clamp time of 131 minutes.  All proximal and distal coronary anastomoses were inspected for hemostasis and appropriate graft orientation. Epicardial pacing wires are fixed to the right ventricular outflow tract and to the right atrial appendage. The patient is rewarmed to 37C temperature. The aortic and left ventricular vents were removed.  The patient is weaned and disconnected from cardiopulmonary bypass.  The patient's rhythm at separation from bypass was AV paced.  The patient was weaned from cardioplegic bypass without any inotropic support. Total cardiopulmonary bypass time for the operation was 184 minutes.  The patient received a total of 3 units packed red blood cells during cardioplegia bypass to 2 anemia which was present prior to surgery and exacerbated by hemodilution.  Followup transesophageal echocardiogram performed after separation from bypass revealed a well-seated bioprosthetic tissue valve in the aortic position that was functioning normally. Left ventricular systolic function remained normal. There was no aortic insufficiency. There is trace mitral regurgitation.  The aortic and venous cannula were removed uneventfully. Protamine was administered to reverse the anticoagulation. The mediastinum and pleural space were inspected for hemostasis and irrigated with saline solution. The mediastinum and both pleural spaces were drained using 4 chest tubes placed through separate stab incisions inferiorly.  The soft tissues anterior to the aorta were reapproximated loosely. The sternum is closed with double strength sternal wire. The soft tissues anterior to the sternum were closed in multiple layers and the skin is closed with a running subcuticular skin closure.  The post-bypass portion of the operation was notable for stable rhythm.  Low dose dopamine was added early after separation from bypass and hemodynamics remained stable.  The patient was  transfused 2 packs of platelets and  2 units fresh frozen plasma for coagulopathy and thrombocytopenia following separation from bypass.  The patient tolerated the procedure well and is transported to the surgical intensive care in stable condition. There are no intraoperative complications. All sponge instrument and needle counts are verified correct at completion of the operation.   Valentina Gu. Roxy Manns MD 07/05/2011 12:17 AM

## 2011-07-04 NOTE — Consult Note (Signed)
CARDIOTHORACIC SURGERY CONSULTATION REPORT  PCP is VYAS,DHRUV B., MD, MD Referring Provider is [name], MD   Reason for consultation:  Critical left main disease  HPI:  Patient is a 66 yo obese white female from [place], [place] with no previous history of CAD but risk factors including HTN, type II diabetes mellitus, and a previous history of tobacco abuse.  The patient describes a long history of exertional chest pain and SOB.  Several days ago the patient developed rapidly accelerated symptoms with severe chest pain occurring at rest with radiation to the jaw and both arms.  She ultimately presented to [facility] ED 07/02/2011 where she was noted to be in rapid atrial fibrillation which spontaneously converted to sinus rhythm.  Cardiac enzymes were elevated, prompting transfer to [facility] yesterday for management of acute non-STEMI.  She has remained pain-free since transfer and was taken for cath today by Dr Burt Knack.  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  Cardiothoracic surgical consultation was requested.  Past Medical History  Diagnosis Date  . Heart murmur   . Angina   . Hypertension   . Diabetes mellitus   . Peripheral vascular disease     per family  . Shortness of breath   . Anemia     hx of anemia  . Cancer     rectal cancer history  . GERD (gastroesophageal reflux disease)   . Gout     hx of   . Anxiety   . Developmental delay     Past Surgical History  Procedure Date  . Abdominal hysterectomy   . Colonoscopy w/ biopsies and polypectomy   . Low anterior bowel resection 2011    rectal cancer    History reviewed. No pertinent family history.  Social History History  Substance Use Topics  . Smoking status: Former Smoker -- 1.0 packs/day for 20 years  . Smokeless tobacco: Never Used   Comment: Quit > 20 years ago  . Alcohol Use: No    Prior to Admission medications     Medication Sig Start Date End Date Taking? Authorizing Provider  atenolol (TENORMIN) 50 MG tablet Take 50-75 mg by mouth daily. Monday, Wednesday, Friday, Sunday-1.5 tablets Tuesday, Thursday, Saturday-1 tablet   Yes Historical Provider, MD  bumetanide (BUMEX) 1 MG tablet Take 2 mg by mouth daily.   Yes Historical Provider, MD  ferrous sulfate 325 (65 FE) MG tablet Take 325 mg by mouth daily with breakfast.   Yes Historical Provider, MD  glipiZIDE (GLUCOTROL XL) 10 MG 24 hr tablet Take 10 mg by mouth 2 (two) times daily.   Yes Historical Provider, MD  metFORMIN (GLUCOPHAGE) 1000 MG tablet Take 1,000 mg by mouth daily.   Yes Historical Provider, MD  potassium chloride (K-DUR,KLOR-CON) 10 MEQ tablet Take 10 mEq by mouth daily.   Yes Historical Provider, MD  raloxifene (EVISTA) 60 MG tablet Take 60 mg by mouth daily.   Yes Historical Provider, MD  Vitamin D, Ergocalciferol, (DRISDOL) 50000 UNITS CAPS Take 50,000 Units by mouth every 7 (seven) days. mondays   Yes Historical Provider, MD    Current Facility-Administered Medications  Medication Dose Route Frequency Provider Last Rate Last Dose  . 0.45 % sodium chloride infusion   Intravenous Continuous Fay Records, MD 50 mL/hr at 07/03/11 1209    . 0.45 % sodium chloride infusion   Intravenous Continuous Carmin Muskrat  Harrington Challenger, MD 50 mL/hr at 07/04/11 0314    . 0.9 %  sodium chloride infusion  250 mL Intravenous PRN Rhonda G Barrett, PA      . 0.9 %  sodium chloride infusion  250 mL Intravenous PRN Fay Records, MD      . acetaminophen (TYLENOL) tablet 650 mg  650 mg Oral Q4H PRN Fay Records, MD      . aminocaproic acid (AMICAR) 10 g in sodium chloride 0.9 % 100 mL infusion   Intravenous To OR Rexene Alberts, MD      . aspirin chewable tablet 324 mg  324 mg Oral Pre-Cath Fay Records, MD   324 mg at 07/04/11 [id number]  . aspirin EC tablet 81 mg  81 mg Oral Daily Fay Records, MD      . atorvastatin (LIPITOR) tablet 20 mg  20 mg Oral q1800 Fay Records, MD       . bumetanide [facility]) tablet 2 mg  2 mg Oral Daily Evelene Croon Barrett, PA   2 mg at 07/03/11 1348  . cefUROXime (ZINACEF) 1.5 g in dextrose 5 % 50 mL IVPB  1.5 g Intravenous To OR Rexene Alberts, MD      . cefUROXime (ZINACEF) 750 mg in dextrose 5 % 50 mL IVPB  750 mg Intravenous To OR Rexene Alberts, MD      . dexmedetomidine (PRECEDEX) 400 mcg in sodium chloride 0.9 % 100 mL infusion  0.1-0.7 mcg/kg/hr Intravenous To OR Rexene Alberts, MD      . diazepam (VALIUM) tablet 5 mg  5 mg Oral On Call Evelene Croon Barrett, PA   5 mg at 07/04/11 1327  . DOPamine (INTROPIN) 800 mg in dextrose 5 % 250 mL infusion  2-20 mcg/kg/min Intravenous To OR Rexene Alberts, MD      . EPINEPHrine (ADRENALIN) 4,000 mcg in dextrose 5 % 250 mL infusion  0.5-20 mcg/min Intravenous To OR Rexene Alberts, MD      . fentaNYL (SUBLIMAZE) 0.05 MG/ML injection           . ferrous sulfate tablet 325 mg  325 mg Oral Q breakfast Evelene Croon Barrett, PA   325 mg at 07/04/11 0746  . glipiZIDE (GLUCOTROL XL) 24 hr tablet 10 mg  10 mg Oral BID Evelene Croon Barrett, PA   10 mg at 07/03/11 1349  . heparin 2-0.9 UNIT/ML-% infusion           . heparin ADULT infusion 100 units/mL (25000 units/250 mL)  1,000 Units/hr Intravenous Continuous Crystal Dahlgren, PHARMD   1,000 Units/hr at 07/04/11 [id number]  . insulin aspart (novoLOG) injection 0-15 Units  0-15 Units Subcutaneous TID WC Evelene Croon Barrett, PA   2 Units at 07/04/11 0746  . insulin aspart (novoLOG) injection 0-5 Units  0-5 Units Subcutaneous QHS Rhonda G Barrett, PA      . insulin regular (NOVOLIN R,HUMULIN R) 1 Units/mL in sodium chloride 0.9 % 100 mL infusion   Intravenous To OR Rexene Alberts, MD      . lidocaine (XYLOCAINE) 1 % injection           . magnesium sulfate (IV Push/IM) injection 40 mEq  40 mEq Other To OR Rexene Alberts, MD      . metoprolol (LOPRESSOR) injection 5 mg  5 mg Intravenous Once Fay Records, MD   5 mg at 07/04/11 1146  . metoprolol (LOPRESSOR) injection 5  mg  5 mg Intravenous Once Fay Records, MD   5 mg at 07/04/11 1221  . metoprolol tartrate (LOPRESSOR) tablet 25 mg  25 mg Oral QID Fay Records, MD   25 mg at 07/04/11 0945  . midazolam (VERSED) 2 MG/2ML injection           . nitroGLYCERIN (NITROSTAT) SL tablet 0.4 mg  0.4 mg Sublingual Q5 Min x 3 PRN Fay Records, MD      . nitroGLYCERIN 0.2 mg/mL in dextrose 5 % infusion  10 mcg/min Intravenous Continuous Evelene Croon Barrett, PA 3 mL/hr at 07/03/11 1737 10 mcg/min at 07/03/11 1737  . nitroGLYCERIN 0.2 mg/mL in dextrose 5 % infusion  2-200 mcg/min Intravenous To OR Rexene Alberts, MD      . nitroglycerin/verapamil/heparin/sodium bicarbonate solution irrigation for artery spasm   Irrigation To OR Rexene Alberts, MD      . ondansetron [facility]) injection 4 mg  4 mg Intravenous Q6H PRN Fay Records, MD      . phenylephrine (NEO-SYNEPHRINE) 20,000 mcg in dextrose 5 % 250 mL infusion  30-200 mcg/min Intravenous To OR Rexene Alberts, MD      . potassium chloride (K-DUR,KLOR-CON) CR tablet 10 mEq  10 mEq Oral Daily Evelene Croon Barrett, PA   10 mEq at 07/04/11 0945  . potassium chloride injection 80 mEq  80 mEq Other To OR Rexene Alberts, MD      . raloxifene (EVISTA) tablet 60 mg  60 mg Oral Daily Evelene Croon Barrett, PA   60 mg at 07/04/11 0946  . sodium chloride 0.9 % injection 3 mL  3 mL Intravenous Q12H Rhonda G Barrett, PA   3 mL at 07/04/11 0037  . sodium chloride 0.9 % injection 3 mL  3 mL Intravenous PRN Rhonda G Barrett, PA      . sodium chloride 0.9 % injection 3 mL  3 mL Intravenous Q12H Fay Records, MD   3 mL at 07/04/11 0038  . sodium chloride 0.9 % injection 3 mL  3 mL Intravenous PRN Fay Records, MD      . vancomycin (VANCOCIN) 1,250 mg in sodium chloride 0.9 % 250 mL IVPB  1,250 mg Intravenous To OR Rexene Alberts, MD      . Vitamin D (Ergocalciferol) (DRISDOL) capsule 50,000 Units  50,000 Units Oral Q7 days Lonn Georgia, PA        No Known Allergies  Review of  Systems:  General:  Patient is not a good historian, normal appetite, normal energy   Respiratory:  no cough, no wheezing, no hemoptysis, no pain with inspiration or cough, + shortness of breath   Cardiac:  + chest pain or tightness, + exertional SOB, no resting SOB, no PND, no orthopnea, + LE edema, no palpitations, no syncope  GI:   no difficulty swallowing, no hematochezia, no hematemesis, no melena, no constipation, no diarrhea   GU:   no dysuria, no urgency, + frequency   Musculoskeletal: no arthritis, + mild arthralgia in knees, reports no significant limitations with ambulation but doesn't do much physically  Vascular:  no pain suggestive of claudication   Neuro:   no symptoms suggestive of TIA's, no seizures, no headaches, no peripheral neuropathy, + some baseline cognitive impairment  Endocrine:  Longstanding diabetes, does not check sugars  HEENT:  no loose teeth or painful teeth,  no recent vision changes  Psych:   + anxiety, no depression  Physical Exam:   BP 135/82  Pulse 88  Temp(Src) 98.7 F (37.1 C) (Axillary)  Resp 18  Ht 4\' 10"  (1.473 m)  Wt 78.9 kg (173 lb 15.1 oz)  BMI 36.35 kg/m2  SpO2 94%  General:  Obese, not  well-appearing  HEENT:  Unremarkable   Neck:   no JVD, no bruits, no adenopathy   Chest:   clear to auscultation, symmetrical breath sounds, no wheezes, no rhonchi   CV:   RRR, + grade II/VI systolic murmur   Abdomen:  soft, non-tender, no masses   Extremities:  warm, well-perfused, pulses not palpable, chronic skin changes both LE's with chronic venous insufficiency  Rectal/GU  Deferred  Neuro:   Grossly non-focal and symmetrical throughout  Skin:   Clean and dry, no rashes, no breakdown  Diagnostic Tests:  Cardiac Catheterization Procedure Note  Name: Melinda Vang  MRN: [id number]  DOB: 1945-04-29  Procedure: Left Heart Cath, Selective Coronary Angiography, LV angiography, aortic root angiography, abdominal aortic angiography.  Procedural  Findings:  Hemodynamics:  AO 12665  LV [id number]  Coronary angiography:  Coronary dominance: Codominant  Left mainstem: The left main is heavily calcified. There is critical proximal and midshaft left main stenosis of 95%. The left main divides into the LAD and left circumflex.  Left anterior descending (LAD): The LAD has severe diffuse proximal stenosis of 80-90% extending beyond the first diagonal branch. The mid and distal LAD had diffuse nonobstructive disease. The LAD wraps around the left ventricular apex.  Left circumflex (LCx): The left circumflex is codominant with the right coronary artery. The AV groove circumflex is widely patent. The first obtuse marginal ranch is of moderate caliber and it has severe proximal stenosis of 75-80%. The second OM and left PDA has no significant obstructive disease but they are diffusely diseased vessels. The right coronary artery is codominant with the left circumflex.  Right coronary artery (RCA): There is diffuse calcific disease with 80% diffuse mid-vessel stenosis.  Left ventriculography: Left ventricular systolic function is normal, LVEF is estimated at 55-65%, there is no significant mitral regurgitation  Final Conclusions:  1. Severe left main stem stenosis  2. Severe proximal LAD stenosis  3. Moderately severe left circumflex stenosis 4. Severe right coronary artery stenosis and a codominant vessel.  4. Preserved LV systolic function   [name]  07/04/2011, 2:52 PM    Impression:  Critical left main stenosis with 3-vessel CAD, s/p acute non-STEMI with preserved LV systolic function.  The patient has a systolic murmur but does not have significant mitral regurgitation and there was no significant gradient measured across the aortic valve.  She has numerous comorbid medical conditions, what seems to be a very limited functional status, and a somewhat limited understanding of her current and previous health conditions, all of which will  increase her risks for postoperative complications and likely contribute to a slow recovery.  However, she has critical coronary anatomy with unstable presentation, and clearly surgical revascularization is the only reasonable option.  Plan:  We plan to proceed directly to the OR for emergency CABG with intraoperative TEE.  The patient and her family understand and accepts all potential associated risks of surgery including but not limited to risk of death, stroke, myocardial infarction, congestive heart failure, respiratory failure, renal failure, bleeding requiring blood transfusion and/or reexploration, arrhythmia, heart block or bradycardia requiring permanent pacemaker, pneumonia, pleural effusion, wound infection, pulmonary embolus or other thromboembolic complication, chronic pain or other delayed complications.  All questions  answered.     Valentina Gu. Roxy Manns, MD 07/04/2011 3:48 PM

## 2011-07-04 NOTE — Transfer of Care (Signed)
Immediate Anesthesia Transfer of Care Note  Patient: Melinda Vang  Procedure(s) Performed: Procedure(s) (LRB): CORONARY ARTERY BYPASS GRAFTING (CABG) (N/A) AORTIC VALVE REPLACEMENT (AVR) (N/A) MYOMECTOMY (N/A)  Patient Location: PACU and SICU  Anesthesia Type: General  Level of Consciousness: Patient remains intubated per anesthesia plan  Airway & Oxygen Therapy: Patient remains intubated per anesthesia plan  Post-op Assessment: Post -op Vital signs reviewed and stable  Post vital signs: stable  Complications: No apparent anesthesia complications

## 2011-07-04 NOTE — CV Procedure (Signed)
   Cardiac Catheterization Procedure Note  Name: Melinda Vang MRN: [id number] DOB: [birth year]  Procedure: Left Heart Cath, Selective Coronary Angiography, LV angiography, aortic root angiography, abdominal aortic angiography.  Indication: 66 year old woman who presented with non-ST elevation infarction. She has diabetes. She has long-standing exertional angina with minimal activity. She has never had cardiac catheterization. She presented with crescendo symptoms and ruled in for non-ST elevation infarction and was now referred for cardiac cath.   Procedural details: The right groin was prepped, draped, and anesthetized with 1% lidocaine. Using modified Seldinger technique, a 5 French sheath was introduced into the right femoral artery. Standard Judkins catheters were used for coronary angiography and left ventriculography. Catheter exchanges were performed over a guidewire. There were no immediate procedural complications. The patient was transferred to the post catheterization recovery area for further monitoring.  Procedural Findings: Hemodynamics:  AO 12665 LV [id number]   Coronary angiography: Coronary dominance: Codominant  Left mainstem: The left main is heavily calcified. There is critical proximal and midshaft left main stenosis of 95%. The left main divides into the LAD and left circumflex.  Left anterior descending (LAD): The LAD has severe diffuse proximal stenosis of 80-90% extending beyond the first diagonal branch. The mid and distal LAD had diffuse nonobstructive disease. The LAD wraps around the left ventricular apex.  Left circumflex (LCx): The left circumflex is codominant with the right coronary artery. The AV groove circumflex is widely patent. The first obtuse marginal ranch is of moderate caliber and it has severe proximal stenosis of 75-80%. The second OM and left PDA has no significant obstructive disease but they are diffusely diseased vessels. The right coronary  artery is codominant with the left circumflex.   Right coronary artery (RCA): There is diffuse calcific disease with 80% diffuse mid-vessel stenosis.   Left ventriculography: Left ventricular systolic function is normal, LVEF is estimated at 55-65%, there is no significant mitral regurgitation   Final Conclusions:   1. Severe left main stem stenosis 2. Severe proximal LAD stenosis 3. Moderately severe left circumflex stenosis 4. Severe right coronary artery stenosis and a codominant vessel. 4. Preserved LV systolic function  Recommendations: The patient needs emergency coronary bypass surgery. She has critical left main disease. I have called Dr. Roxy Manns who will come and see her.  [name] 07/04/2011, 2:52 PM

## 2011-07-04 NOTE — Interval H&P Note (Signed)
History and Physical Interval Note:  07/04/2011 2:11 PM  Melinda Vang  has presented today for surgery, with the diagnosis of chest pain  The various methods of treatment have been discussed with the patient and family. After consideration of risks, benefits and other options for treatment, the patient has consented to  Procedure(s) (LRB): LEFT HEART CATHETERIZATION WITH CORONARY ANGIOGRAM (N/A) as a surgical intervention .  The patients' history has been reviewed, patient examined, no change in status, stable for surgery.  I have reviewed the patients' chart and labs.  Questions were answered to the patient's satisfaction.  Pt interviewed and examined. She has an AS murmur. Will cross valve and evaluate gradients. She is at high-risk of multivessel disease based on presence of diabetes and long history of angina.  [name] 07/04/2011 2:11 PM

## 2011-07-04 NOTE — Progress Notes (Signed)
UR Completed. Simmons, Prinston Kynard F [phone]  

## 2011-07-04 NOTE — Progress Notes (Signed)
Subjective: No CP  No SOB. Objective: Filed Vitals:   07/03/11 2100 07/04/11 0039 07/04/11 0500 07/04/11 0945  BP: 144/70 125/73 127/74 120/73  Pulse: 82 88 74 90  Temp: 99.1 F (37.3 C)  98 F (36.7 C)   TempSrc: Axillary  Oral   Resp: 20  20   Height:      Weight:   173 lb 15.1 oz (78.9 kg)   SpO2: 93%  95%    Weight change: 1 lb 8.7 oz (0.7 kg)  Intake/Output Summary (Last 24 hours) at 07/04/11 1133 Last data filed at 07/04/11 0900  Gross per 24 hour  Intake 399.98 ml  Output   1750 ml  Net -1350.02 ml    General: Alert, awake, oriented x3, in no acute distress Neck:  JVP is normal Heart: Irregular rate and rhythm, without murmurs, rubs, gallops.  Lungs: Clear to auscultation.  No rales or wheezes. Exemities:  1-2+ edema  Neuro: Grossly intact, nonfocal.  Tele;  Initially SR.  Now atrial fibrillation.  Rates of afib 110s to 140s.   Lab Results: Results for orders placed during the hospital encounter of 07/03/11 (from the past 24 hour(s))  GLUCOSE, CAPILLARY     Status: Abnormal   Collection Time   07/03/11 11:55 AM      Component Value Range   Glucose-Capillary 116 (*) 70 - 99 (mg/dL)  TSH     Status: Normal   Collection Time   07/03/11 12:50 PM      Component Value Range   TSH 2.755  0.350 - 4.500 (uIU/mL)  APTT     Status: Normal   Collection Time   07/03/11 12:50 PM      Component Value Range   aPTT 32  24 - 37 (seconds)  CBC     Status: Abnormal   Collection Time   07/03/11 12:50 PM      Component Value Range   WBC 6.2  4.0 - 10.5 (K/uL)   RBC 3.41 (*) 3.87 - 5.11 (MIL/uL)   Hemoglobin 10.7 (*) 12.0 - 15.0 (g/dL)   HCT 32.3 (*) 36.0 - 46.0 (%)   MCV 94.7  78.0 - 100.0 (fL)   MCH 31.4  26.0 - 34.0 (pg)   MCHC 33.1  30.0 - 36.0 (g/dL)   RDW 13.3  11.5 - 15.5 (%)   Platelets 191  150 - 400 (K/uL)  COMPREHENSIVE METABOLIC PANEL     Status: Abnormal   Collection Time   07/03/11 12:50 PM      Component Value Range   Sodium 142  135 - 145 (mEq/L)   Potassium 3.7  3.5 - 5.1 (mEq/L)   Chloride 105  96 - 112 (mEq/L)   CO2 26  19 - 32 (mEq/L)   Glucose, Bld 124 (*) 70 - 99 (mg/dL)   BUN 15  6 - 23 (mg/dL)   Creatinine, Ser 1.11 (*) 0.50 - 1.10 (mg/dL)   Calcium 9.1  8.4 - 10.5 (mg/dL)   Total Protein 5.5 (*) 6.0 - 8.3 (g/dL)   Albumin 2.8 (*) 3.5 - 5.2 (g/dL)   AST 28  0 - 37 (U/L)   ALT 14  0 - 35 (U/L)   Alkaline Phosphatase 64  39 - 117 (U/L)   Total Bilirubin 0.3  0.3 - 1.2 (mg/dL)   GFR calc non Af Amer 50 (*) >90 (mL/min)   GFR calc Af Amer 58 (*) >90 (mL/min)  DIFFERENTIAL     Status: Normal  Collection Time   07/03/11 12:50 PM      Component Value Range   Neutrophils Relative 76  43 - 77 (%)   Neutro Abs 4.7  1.7 - 7.7 (K/uL)   Lymphocytes Relative 17  12 - 46 (%)   Lymphs Abs 1.0  0.7 - 4.0 (K/uL)   Monocytes Relative 6  3 - 12 (%)   Monocytes Absolute 0.4  0.1 - 1.0 (K/uL)   Eosinophils Relative 1  0 - 5 (%)   Eosinophils Absolute 0.1  0.0 - 0.7 (K/uL)   Basophils Relative 0  0 - 1 (%)   Basophils Absolute 0.0  0.0 - 0.1 (K/uL)  PROTIME-INR     Status: Normal   Collection Time   07/03/11 12:50 PM      Component Value Range   Prothrombin Time 14.5  11.6 - 15.2 (seconds)   INR 1.11  0.00 - 1.49   HEMOGLOBIN A1C     Status: Abnormal   Collection Time   07/03/11 12:50 PM      Component Value Range   Hemoglobin A1C 7.6 (*) <5.7 (%)   Mean Plasma Glucose 171 (*) <117 (mg/dL)  GLUCOSE, CAPILLARY     Status: Abnormal   Collection Time   07/03/11  4:35 PM      Component Value Range   Glucose-Capillary 179 (*) 70 - 99 (mg/dL)   Comment 1 Notify RN    CARDIAC PANEL(CRET KIN+CKTOT+MB+TROPI)     Status: Abnormal   Collection Time   07/03/11  5:29 PM      Component Value Range   Total CK 138  7 - 177 (U/L)   CK, MB 7.0 (*) 0.3 - 4.0 (ng/mL)   Troponin I 4.32 (*) <0.30 (ng/mL)   Relative Index 5.1 (*) 0.0 - 2.5   HEPARIN LEVEL (UNFRACTIONATED)     Status: Normal   Collection Time   07/03/11  8:13 PM      Component  Value Range   Heparin Unfractionated 0.62  0.30 - 0.70 (IU/mL)  GLUCOSE, CAPILLARY     Status: Abnormal   Collection Time   07/03/11  8:55 PM      Component Value Range   Glucose-Capillary 192 (*) 70 - 99 (mg/dL)   Comment 1 Notify RN    CARDIAC PANEL(CRET KIN+CKTOT+MB+TROPI)     Status: Abnormal   Collection Time   07/03/11 11:57 PM      Component Value Range   Total CK 102  7 - 177 (U/L)   CK, MB 3.8  0.3 - 4.0 (ng/mL)   Troponin I 2.92 (*) <0.30 (ng/mL)   Relative Index 3.7 (*) 0.0 - 2.5   GLUCOSE, CAPILLARY     Status: Abnormal   Collection Time   07/04/11 12:45 AM      Component Value Range   Glucose-Capillary 133 (*) 70 - 99 (mg/dL)   Comment 1 Notify RN    HEPARIN LEVEL (UNFRACTIONATED)     Status: Normal   Collection Time   07/04/11  6:00 AM      Component Value Range   Heparin Unfractionated 0.39  0.30 - 0.70 (IU/mL)  CBC     Status: Abnormal   Collection Time   07/04/11  6:00 AM      Component Value Range   WBC 6.8  4.0 - 10.5 (K/uL)   RBC 3.33 (*) 3.87 - 5.11 (MIL/uL)   Hemoglobin 10.3 (*) 12.0 - 15.0 (g/dL)   HCT 31.4 (*) 36.0 -  46.0 (%)   MCV 94.3  78.0 - 100.0 (fL)   MCH 30.9  26.0 - 34.0 (pg)   MCHC 32.8  30.0 - 36.0 (g/dL)   RDW 13.4  11.5 - 15.5 (%)   Platelets 184  150 - 400 (K/uL)  CARDIAC PANEL(CRET KIN+CKTOT+MB+TROPI)     Status: Abnormal   Collection Time   07/04/11  6:00 AM      Component Value Range   Total CK 79  7 - 177 (U/L)   CK, MB 2.7  0.3 - 4.0 (ng/mL)   Troponin I 2.44 (*) <0.30 (ng/mL)   Relative Index RELATIVE INDEX IS INVALID  0.0 - 2.5   LIPID PANEL     Status: Abnormal   Collection Time   07/04/11  6:00 AM      Component Value Range   Cholesterol 200  0 - 200 (mg/dL)   Triglycerides 257 (*) <150 (mg/dL)   HDL 48  >39 (mg/dL)   Total CHOL/HDL Ratio 4.2     VLDL 51 (*) 0 - 40 (mg/dL)   LDL Cholesterol 101 (*) 0 - 99 (mg/dL)  GLUCOSE, CAPILLARY     Status: Abnormal   Collection Time   07/04/11  7:22 AM      Component Value Range     Glucose-Capillary 150 (*) 70 - 99 (mg/dL)   Comment 1 Notify RN      Studies/Results: No results found.  Medications: I have reviewed the patient's current medications.   Patient Active Hospital Problem List: NSTEMI (non-ST elevated myocardial infarction) (07/03/2011)   Assessment: Trivial bump in CK/MB.  Plan for cath today. (R/L) to evaluate pressures and anatomy.  DM (diabetes mellitus) (07/03/2011)   Assessment:  Fair control  Dyslipidemia:  Will start lipitor  Afib:  Back in afib now.  Will need long term anticoagulation.  Add a little IV lopressor to bring down rates  Continue PO.  TSH is normal.  LOS: 1 day   Melinda Vang 07/04/2011, 11:33 AM

## 2011-07-04 NOTE — Progress Notes (Signed)
ANTICOAGULATION CONSULT NOTE - Follow Up Consult  Pharmacy Consult for Heparin Indication: atrial fibrillation, NSTEMI  No Known Allergies  Patient Measurements: Height: 4\' 10"  (147.3 cm) Weight: 173 lb 15.1 oz (78.9 kg) IBW/kg (Calculated) : 40.9  Heparin Dosing Weight: 63 kg  Vital Signs: Temp: 98 F (36.7 C) (03/22 0500) Temp src: Oral (03/22 0500) BP: 120/73 mmHg (03/22 0945) Pulse Rate: 90  (03/22 0945)  Labs:  Basename 07/04/11 0600 07/03/11 2357 07/03/11 2013 07/03/11 1729 07/03/11 1250  HGB 10.3* -- -- -- 10.7*  HCT 31.4* -- -- -- 32.3*  PLT 184 -- -- -- 191  APTT -- -- -- -- 32  LABPROT -- -- -- -- 14.5  INR -- -- -- -- 1.11  HEPARINUNFRC 0.39 -- 0.62 -- --  CREATININE -- -- -- -- 1.11*  CKTOTAL 79 102 -- 138 --  CKMB 2.7 3.8 -- 7.0* --  TROPONINI 2.44* 2.92* -- 4.32* --   Estimated Creatinine Clearance: 43.6 ml/min (by C-G formula based on Cr of 1.11).   Medications:  Infusions:     . sodium chloride 50 mL/hr at 07/03/11 1209  . sodium chloride 50 mL/hr at 07/04/11 0314  . heparin 1,000 Units/hr (07/04/11 0924)  . nitroGLYCERIN 10 mcg/min (07/03/11 1737)  . DISCONTD: sodium chloride 75 mL/hr at 07/03/11 1206    Assessment: Transferred from [facility] for NSTEMI and afib. Heparin level 0.39 in goal range. Hgb low but stable.  Cards: heart murmur, angina, HTN, PVD. Max BP 157/77 with HR 74-88 Meds: ASA 81mg , Bumex, metoprolol, K  GI/Nutriton: h/o GERD Meds: FESO4, K, Evista, Vit D  Endo: DM, gout (hold glucophage), CBGs 116-192 Meds: glipizide, SSI  Heme/Onc: h/o anemia and rectal cancer on FESO4  Neuro: anxiety, developmental delay  Goal of Therapy:  Heparin level 0.3-0.7 units/ml  Plan: Continue heparin 1000 units/hr Plan cath today  Autrey Human S. Alford Highland, PharmD, [facility] Clinical Staff Pharmacist Pager [phone]  07/04/2011, 10:54 AM

## 2011-07-04 NOTE — H&P (View-Only) (Signed)
Subjective: No CP  No SOB. Objective: Filed Vitals:   07/03/11 2100 07/04/11 0039 07/04/11 0500 07/04/11 0945  BP: 144/70 125/73 127/74 120/73  Pulse: 82 88 74 90  Temp: 99.1 F (37.3 C)  98 F (36.7 C)   TempSrc: Axillary  Oral   Resp: 20  20   Height:      Weight:   173 lb 15.1 oz (78.9 kg)   SpO2: 93%  95%    Weight change: 1 lb 8.7 oz (0.7 kg)  Intake/Output Summary (Last 24 hours) at 07/04/11 1133 Last data filed at 07/04/11 0900  Gross per 24 hour  Intake 399.98 ml  Output   1750 ml  Net -1350.02 ml    General: Alert, awake, oriented x3, in no acute distress Neck:  JVP is normal Heart: Irregular rate and rhythm, without murmurs, rubs, gallops.  Lungs: Clear to auscultation.  No rales or wheezes. Exemities:  1-2+ edema  Neuro: Grossly intact, nonfocal.  Tele;  Initially SR.  Now atrial fibrillation.  Rates of afib 110s to 140s.   Lab Results: Results for orders placed during the hospital encounter of 07/03/11 (from the past 24 hour(s))  GLUCOSE, CAPILLARY     Status: Abnormal   Collection Time   07/03/11 11:55 AM      Component Value Range   Glucose-Capillary 116 (*) 70 - 99 (mg/dL)  TSH     Status: Normal   Collection Time   07/03/11 12:50 PM      Component Value Range   TSH 2.755  0.350 - 4.500 (uIU/mL)  APTT     Status: Normal   Collection Time   07/03/11 12:50 PM      Component Value Range   aPTT 32  24 - 37 (seconds)  CBC     Status: Abnormal   Collection Time   07/03/11 12:50 PM      Component Value Range   WBC 6.2  4.0 - 10.5 (K/uL)   RBC 3.41 (*) 3.87 - 5.11 (MIL/uL)   Hemoglobin 10.7 (*) 12.0 - 15.0 (g/dL)   HCT 32.3 (*) 36.0 - 46.0 (%)   MCV 94.7  78.0 - 100.0 (fL)   MCH 31.4  26.0 - 34.0 (pg)   MCHC 33.1  30.0 - 36.0 (g/dL)   RDW 13.3  11.5 - 15.5 (%)   Platelets 191  150 - 400 (K/uL)  COMPREHENSIVE METABOLIC PANEL     Status: Abnormal   Collection Time   07/03/11 12:50 PM      Component Value Range   Sodium 142  135 - 145 (mEq/L)   Potassium 3.7  3.5 - 5.1 (mEq/L)   Chloride 105  96 - 112 (mEq/L)   CO2 26  19 - 32 (mEq/L)   Glucose, Bld 124 (*) 70 - 99 (mg/dL)   BUN 15  6 - 23 (mg/dL)   Creatinine, Ser 1.11 (*) 0.50 - 1.10 (mg/dL)   Calcium 9.1  8.4 - 10.5 (mg/dL)   Total Protein 5.5 (*) 6.0 - 8.3 (g/dL)   Albumin 2.8 (*) 3.5 - 5.2 (g/dL)   AST 28  0 - 37 (U/L)   ALT 14  0 - 35 (U/L)   Alkaline Phosphatase 64  39 - 117 (U/L)   Total Bilirubin 0.3  0.3 - 1.2 (mg/dL)   GFR calc non Af Amer 50 (*) >90 (mL/min)   GFR calc Af Amer 58 (*) >90 (mL/min)  DIFFERENTIAL     Status: Normal  Collection Time   07/03/11 12:50 PM      Component Value Range   Neutrophils Relative 76  43 - 77 (%)   Neutro Abs 4.7  1.7 - 7.7 (K/uL)   Lymphocytes Relative 17  12 - 46 (%)   Lymphs Abs 1.0  0.7 - 4.0 (K/uL)   Monocytes Relative 6  3 - 12 (%)   Monocytes Absolute 0.4  0.1 - 1.0 (K/uL)   Eosinophils Relative 1  0 - 5 (%)   Eosinophils Absolute 0.1  0.0 - 0.7 (K/uL)   Basophils Relative 0  0 - 1 (%)   Basophils Absolute 0.0  0.0 - 0.1 (K/uL)  PROTIME-INR     Status: Normal   Collection Time   07/03/11 12:50 PM      Component Value Range   Prothrombin Time 14.5  11.6 - 15.2 (seconds)   INR 1.11  0.00 - 1.49   HEMOGLOBIN A1C     Status: Abnormal   Collection Time   07/03/11 12:50 PM      Component Value Range   Hemoglobin A1C 7.6 (*) <5.7 (%)   Mean Plasma Glucose 171 (*) <117 (mg/dL)  GLUCOSE, CAPILLARY     Status: Abnormal   Collection Time   07/03/11  4:35 PM      Component Value Range   Glucose-Capillary 179 (*) 70 - 99 (mg/dL)   Comment 1 Notify RN    CARDIAC PANEL(CRET KIN+CKTOT+MB+TROPI)     Status: Abnormal   Collection Time   07/03/11  5:29 PM      Component Value Range   Total CK 138  7 - 177 (U/L)   CK, MB 7.0 (*) 0.3 - 4.0 (ng/mL)   Troponin I 4.32 (*) <0.30 (ng/mL)   Relative Index 5.1 (*) 0.0 - 2.5   HEPARIN LEVEL (UNFRACTIONATED)     Status: Normal   Collection Time   07/03/11  8:13 PM      Component  Value Range   Heparin Unfractionated 0.62  0.30 - 0.70 (IU/mL)  GLUCOSE, CAPILLARY     Status: Abnormal   Collection Time   07/03/11  8:55 PM      Component Value Range   Glucose-Capillary 192 (*) 70 - 99 (mg/dL)   Comment 1 Notify RN    CARDIAC PANEL(CRET KIN+CKTOT+MB+TROPI)     Status: Abnormal   Collection Time   07/03/11 11:57 PM      Component Value Range   Total CK 102  7 - 177 (U/L)   CK, MB 3.8  0.3 - 4.0 (ng/mL)   Troponin I 2.92 (*) <0.30 (ng/mL)   Relative Index 3.7 (*) 0.0 - 2.5   GLUCOSE, CAPILLARY     Status: Abnormal   Collection Time   07/04/11 12:45 AM      Component Value Range   Glucose-Capillary 133 (*) 70 - 99 (mg/dL)   Comment 1 Notify RN    HEPARIN LEVEL (UNFRACTIONATED)     Status: Normal   Collection Time   07/04/11  6:00 AM      Component Value Range   Heparin Unfractionated 0.39  0.30 - 0.70 (IU/mL)  CBC     Status: Abnormal   Collection Time   07/04/11  6:00 AM      Component Value Range   WBC 6.8  4.0 - 10.5 (K/uL)   RBC 3.33 (*) 3.87 - 5.11 (MIL/uL)   Hemoglobin 10.3 (*) 12.0 - 15.0 (g/dL)   HCT 31.4 (*) 36.0 -  46.0 (%)   MCV 94.3  78.0 - 100.0 (fL)   MCH 30.9  26.0 - 34.0 (pg)   MCHC 32.8  30.0 - 36.0 (g/dL)   RDW 13.4  11.5 - 15.5 (%)   Platelets 184  150 - 400 (K/uL)  CARDIAC PANEL(CRET KIN+CKTOT+MB+TROPI)     Status: Abnormal   Collection Time   07/04/11  6:00 AM      Component Value Range   Total CK 79  7 - 177 (U/L)   CK, MB 2.7  0.3 - 4.0 (ng/mL)   Troponin I 2.44 (*) <0.30 (ng/mL)   Relative Index RELATIVE INDEX IS INVALID  0.0 - 2.5   LIPID PANEL     Status: Abnormal   Collection Time   07/04/11  6:00 AM      Component Value Range   Cholesterol 200  0 - 200 (mg/dL)   Triglycerides 257 (*) <150 (mg/dL)   HDL 48  >39 (mg/dL)   Total CHOL/HDL Ratio 4.2     VLDL 51 (*) 0 - 40 (mg/dL)   LDL Cholesterol 101 (*) 0 - 99 (mg/dL)  GLUCOSE, CAPILLARY     Status: Abnormal   Collection Time   07/04/11  7:22 AM      Component Value Range     Glucose-Capillary 150 (*) 70 - 99 (mg/dL)   Comment 1 Notify RN      Studies/Results: No results found.  Medications: I have reviewed the patient's current medications.   Patient Active Hospital Problem List: NSTEMI (non-ST elevated myocardial infarction) (07/03/2011)   Assessment: Trivial bump in CK/MB.  Plan for cath today. (R/L) to evaluate pressures and anatomy.  DM (diabetes mellitus) (07/03/2011)   Assessment:  Fair control  Dyslipidemia:  Will start lipitor  Afib:  Back in afib now.  Will need long term anticoagulation.  Add a little IV lopressor to bring down rates  Continue PO.  TSH is normal.  LOS: 1 day   Melinda Vang 07/04/2011, 11:33 AM

## 2011-07-04 NOTE — Anesthesia Preprocedure Evaluation (Addendum)
Anesthesia Evaluation  Patient identified by MRN, date of birth, ID band Patient awake    Reviewed: Allergy & Precautions, H&P , NPO status , Patient's Chart, lab work & pertinent test results  History of Anesthesia Complications Negative for: history of anesthetic complications  Airway Mallampati: III TM Distance: <3 FB   Mouth opening: Limited Mouth Opening  Dental  (+) Poor Dentition, Missing and Dental Advisory Given   Pulmonary former smoker breath sounds clear to auscultation  Pulmonary exam normal       Cardiovascular hypertension, Pt. on medications + angina + CAD (cath today shows preserved LVF with tight L main) and + Past MI + dysrhythmias + Valvular Problems/Murmurs (h/o murmur) Rhythm:Regular Rate:Normal     Neuro/Psych PSYCHIATRIC DISORDERS (patient's sister is her caretaker  ) Anxiety negative neurological ROS     GI/Hepatic GERD-  Controlled,  Endo/Other  Diabetes mellitus-, Type 2, Oral Hypoglycemic AgentsMorbid obesity  Renal/GU      Musculoskeletal   Abdominal (+) + obese,   Peds  Hematology   Anesthesia Other Findings   Reproductive/Obstetrics                          Anesthesia Physical Anesthesia Plan  ASA: IV and Emergent  Anesthesia Plan: General   Post-op Pain Management:    Induction: Intravenous  Airway Management Planned: Oral ETT  Additional Equipment: Arterial line, CVP, PA Cath and TEE  Intra-op Plan:   Post-operative Plan: Post-operative intubation/ventilation  Informed Consent: I have reviewed the patients History and Physical, chart, labs and discussed the procedure including the risks, benefits and alternatives for the proposed anesthesia with the patient or authorized representative who has indicated his/her understanding and acceptance.   Dental advisory given  Plan Discussed with: Anesthesiologist, Surgeon and CRNA  Anesthesia Plan Comments:  (Plan routine monitors, A line, PA catheter, TEE GETA with post-op ventilation )       Anesthesia Quick Evaluation

## 2011-07-04 NOTE — Brief Op Note (Addendum)
CARDIOTHORACIC SURGERY BRIEF OPERATIVE NOTE  PATIENT:  Melinda Vang  66 y.o. 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  PRE-OPERATIVE WEIGHT: 78.9 kg  Melinda Vang H 07/04/2011 11:21 PM

## 2011-07-05 ENCOUNTER — Encounter (HOSPITAL_COMMUNITY): Payer: Self-pay | Admitting: Thoracic Surgery (Cardiothoracic Vascular Surgery)

## 2011-07-05 ENCOUNTER — Other Ambulatory Visit: Payer: Self-pay

## 2011-07-05 ENCOUNTER — Inpatient Hospital Stay (HOSPITAL_COMMUNITY): Payer: Medicare Other

## 2011-07-05 DIAGNOSIS — Z952 Presence of prosthetic heart valve: Secondary | ICD-10-CM

## 2011-07-05 DIAGNOSIS — Z9889 Other specified postprocedural states: Secondary | ICD-10-CM

## 2011-07-05 HISTORY — DX: Presence of prosthetic heart valve: Z95.2

## 2011-07-05 HISTORY — DX: Other specified postprocedural states: Z98.890

## 2011-07-05 LAB — CBC
HCT: 31.5 % — ABNORMAL LOW (ref 36.0–46.0)
Hemoglobin: 11.2 g/dL — ABNORMAL LOW (ref 12.0–15.0)
Hemoglobin: 10.9 g/dL — ABNORMAL LOW (ref 12.0–15.0)
MCH: 30.3 pg (ref 26.0–34.0)
MCHC: 34.1 g/dL (ref 30.0–36.0)
MCHC: 34.4 g/dL (ref 30.0–36.0)
Platelets: 119 10*3/uL — ABNORMAL LOW (ref 150–400)
Platelets: 146 10*3/uL — ABNORMAL LOW (ref 150–400)
RBC: 3.52 MIL/uL — ABNORMAL LOW (ref 3.87–5.11)
RDW: 14.2 % (ref 11.5–15.5)
RDW: 14.3 % (ref 11.5–15.5)
RDW: 14.7 % (ref 11.5–15.5)
WBC: 10.4 10*3/uL (ref 4.0–10.5)
WBC: 7.9 10*3/uL (ref 4.0–10.5)

## 2011-07-05 LAB — GLUCOSE, CAPILLARY
Glucose-Capillary: 119 mg/dL — ABNORMAL HIGH (ref 70–99)
Glucose-Capillary: 109 mg/dL — ABNORMAL HIGH (ref 70–99)
Glucose-Capillary: 131 mg/dL — ABNORMAL HIGH (ref 70–99)
Glucose-Capillary: 133 mg/dL — ABNORMAL HIGH (ref 70–99)
Glucose-Capillary: 135 mg/dL — ABNORMAL HIGH (ref 70–99)
Glucose-Capillary: 123 mg/dL — ABNORMAL HIGH (ref 70–99)
Glucose-Capillary: 134 mg/dL — ABNORMAL HIGH (ref 70–99)
Glucose-Capillary: 137 mg/dL — ABNORMAL HIGH (ref 70–99)
Glucose-Capillary: 132 mg/dL — ABNORMAL HIGH (ref 70–99)
Glucose-Capillary: 110 mg/dL — ABNORMAL HIGH (ref 70–99)
Glucose-Capillary: 103 mg/dL — ABNORMAL HIGH (ref 70–99)

## 2011-07-05 LAB — BASIC METABOLIC PANEL
Calcium: 7.8 mg/dL — ABNORMAL LOW (ref 8.4–10.5)
GFR calc Af Amer: 76 mL/min — ABNORMAL LOW (ref >90)
GFR calc non Af Amer: 66 mL/min — ABNORMAL LOW (ref >90)
Glucose, Bld: 133 mg/dL — ABNORMAL HIGH (ref 70–99)
Sodium: 137 mEq/L (ref 135–145)

## 2011-07-05 LAB — POCT I-STAT 3, ART BLOOD GAS (G3+)
Acid-base deficit: 4 mmol/L — ABNORMAL HIGH (ref 0.0–2.0)
Acid-base deficit: 4 mmol/L — ABNORMAL HIGH (ref 0.0–2.0)
Acid-base deficit: 6 mmol/L — ABNORMAL HIGH (ref 0.0–2.0)
Bicarbonate: 21.2 mEq/L (ref 20.0–24.0)
O2 Saturation: 99 %
Patient temperature: 37.7
Patient temperature: 37.4
TCO2: 22 mmol/L (ref 0–100)
pCO2 arterial: 38.4 mmHg (ref 35.0–45.0)
pCO2 arterial: 44 mmHg (ref 35.0–45.0)
pH, Arterial: 7.282 — ABNORMAL LOW (ref 7.350–7.400)
pO2, Arterial: 87 mmHg (ref 80.0–100.0)

## 2011-07-05 LAB — PROTIME-INR
INR: 1.51 — ABNORMAL HIGH (ref 0.00–1.49)
Prothrombin Time: 18.5 seconds — ABNORMAL HIGH (ref 11.6–15.2)

## 2011-07-05 LAB — POCT I-STAT 7, (LYTES, BLD GAS, ICA,H+H)
Bicarbonate: 20.6 mEq/L (ref 20.0–24.0)
HCT: 32 % — ABNORMAL LOW (ref 36.0–46.0)
Hemoglobin: 10.9 g/dL — ABNORMAL LOW (ref 12.0–15.0)
pH, Arterial: 7.368 (ref 7.350–7.400)
pO2, Arterial: 128 mmHg — ABNORMAL HIGH (ref 80.0–100.0)

## 2011-07-05 LAB — POCT ACTIVATED CLOTTING TIME: Activated Clotting Time: 122 seconds

## 2011-07-05 LAB — PREPARE FRESH FROZEN PLASMA
Unit division: 0
Unit division: 0

## 2011-07-05 LAB — PREPARE PLATELET PHERESIS: Unit division: 0

## 2011-07-05 LAB — CREATININE, SERUM
GFR calc Af Amer: 65 mL/min — ABNORMAL LOW (ref >90)
GFR calc non Af Amer: 56 mL/min — ABNORMAL LOW (ref >90)

## 2011-07-05 LAB — POCT I-STAT, CHEM 8
BUN: 10 mg/dL (ref 6–23)
Creatinine, Ser: 1.2 mg/dL — ABNORMAL HIGH (ref 0.50–1.10)
Hemoglobin: 10.5 g/dL — ABNORMAL LOW (ref 12.0–15.0)
Potassium: 3.6 mEq/L (ref 3.5–5.1)
Sodium: 142 mEq/L (ref 135–145)

## 2011-07-05 LAB — APTT: aPTT: 40 seconds — ABNORMAL HIGH (ref 24–37)

## 2011-07-05 LAB — SURGICAL PCR SCREEN
MRSA, PCR: NEGATIVE
Staphylococcus aureus: NEGATIVE

## 2011-07-05 LAB — MAGNESIUM: Magnesium: 2.8 mg/dL — ABNORMAL HIGH (ref 1.5–2.5)

## 2011-07-05 MED ORDER — DOCUSATE SODIUM 100 MG PO CAPS: 200.0000 mg | ORAL_CAPSULE | Freq: Every day | ORAL | Status: DC

## 2011-07-05 MED ORDER — MAGNESIUM SULFATE 40 MG/ML IJ SOLN
INTRAMUSCULAR | Status: AC
  Administered 2011-07-05: 4 g via INTRAVENOUS
  Filled 2011-07-05: qty 100

## 2011-07-05 MED ORDER — BISACODYL 5 MG PO TBEC
10.0000 mg | DELAYED_RELEASE_TABLET | Freq: Every day | ORAL | Status: DC
  Administered 2011-07-06 – 2011-07-16 (×4): 10 mg via ORAL
  Filled 2011-07-05 (×3): qty 2

## 2011-07-05 MED ORDER — LACTATED RINGERS IV SOLN: INTRAVENOUS | Status: DC

## 2011-07-05 MED ORDER — ACETAMINOPHEN 650 MG RE SUPP: 650.0000 mg | RECTAL | Status: AC

## 2011-07-05 MED ORDER — METOPROLOL TARTRATE 1 MG/ML IV SOLN: 2.5000 mg | INTRAVENOUS | Status: DC | PRN

## 2011-07-05 MED ORDER — SODIUM CHLORIDE 0.9 % IV SOLN: INTRAVENOUS | Status: DC

## 2011-07-05 MED ORDER — ASPIRIN EC 325 MG PO TBEC
325.0000 mg | DELAYED_RELEASE_TABLET | Freq: Every day | ORAL | Status: DC
  Administered 2011-07-06: 325 mg via ORAL
  Filled 2011-07-05 (×3): qty 1

## 2011-07-05 MED ORDER — ACETAMINOPHEN 500 MG PO TABS
1000.0000 mg | ORAL_TABLET | Freq: Four times a day (QID) | ORAL | Status: AC
  Administered 2011-07-06 – 2011-07-09 (×16): 1000 mg via ORAL
  Filled 2011-07-05 (×20): qty 2

## 2011-07-05 MED ORDER — POTASSIUM CHLORIDE 10 MEQ/50ML IV SOLN: 10.0000 meq | INTRAVENOUS | Status: DC

## 2011-07-05 MED ORDER — VANCOMYCIN HCL 1000 MG IV SOLR: 1000.0000 mg | Freq: Once | INTRAVENOUS | Status: DC

## 2011-07-05 MED ORDER — OXYCODONE HCL 5 MG PO TABS: 5.0000 mg | ORAL_TABLET | ORAL | Status: DC | PRN

## 2011-07-05 MED ORDER — BISACODYL 10 MG RE SUPP: 10.0000 mg | Freq: Every day | RECTAL | Status: DC

## 2011-07-05 MED ORDER — METOPROLOL TARTRATE 12.5 MG HALF TABLET: 12.5000 mg | ORAL_TABLET | Freq: Two times a day (BID) | ORAL | Status: DC

## 2011-07-05 MED ORDER — ASPIRIN 81 MG PO CHEW: 324.0000 mg | CHEWABLE_TABLET | Freq: Every day | ORAL | Status: DC

## 2011-07-05 MED ORDER — SODIUM CHLORIDE 0.45 % IV SOLN: INTRAVENOUS | Status: DC

## 2011-07-05 MED ORDER — ACETAMINOPHEN 650 MG RE SUPP: 650.0000 mg | RECTAL | Status: DC

## 2011-07-05 MED ORDER — SODIUM CHLORIDE 0.9 % IV SOLN
INTRAVENOUS | Status: DC
  Administered 2011-07-05: 16:00:00 via INTRAVENOUS
  Filled 2011-07-05 (×2): qty 1

## 2011-07-05 MED ORDER — NITROGLYCERIN IN D5W 200-5 MCG/ML-% IV SOLN: 0.0000 ug/min | INTRAVENOUS | Status: DC

## 2011-07-05 MED ORDER — SODIUM CHLORIDE 0.9 % IJ SOLN
3.0000 mL | Freq: Two times a day (BID) | INTRAMUSCULAR | Status: DC
  Administered 2011-07-05 – 2011-07-06 (×4): 3 mL via INTRAVENOUS

## 2011-07-05 MED ORDER — POTASSIUM CHLORIDE 10 MEQ/50ML IV SOLN
10.0000 meq | INTRAVENOUS | Status: AC
  Administered 2011-07-05 (×2): 10 meq via INTRAVENOUS

## 2011-07-05 MED ORDER — LACTATED RINGERS IV SOLN: 500.0000 mL | Freq: Once | INTRAVENOUS | Status: AC | PRN

## 2011-07-05 MED ORDER — MORPHINE SULFATE 2 MG/ML IJ SOLN: 1.0000 mg | INTRAMUSCULAR | Status: DC | PRN

## 2011-07-05 MED ORDER — AMIODARONE HCL IN DEXTROSE 360-4.14 MG/200ML-% IV SOLN
INTRAVENOUS | Status: AC
  Filled 2011-07-05: qty 200

## 2011-07-05 MED ORDER — FAMOTIDINE IN NACL 20-0.9 MG/50ML-% IV SOLN
20.0000 mg | Freq: Two times a day (BID) | INTRAVENOUS | Status: AC
  Administered 2011-07-05 (×2): 20 mg via INTRAVENOUS
  Filled 2011-07-05: qty 50

## 2011-07-05 MED ORDER — ALBUMIN HUMAN 5 % IV SOLN
INTRAVENOUS | Status: AC
  Filled 2011-07-05: qty 250

## 2011-07-05 MED ORDER — SODIUM CHLORIDE 0.9 % IV SOLN: 250.0000 mL | INTRAVENOUS | Status: DC

## 2011-07-05 MED ORDER — ACETAMINOPHEN 160 MG/5ML PO SOLN: 975.0000 mg | Freq: Four times a day (QID) | ORAL | Status: DC

## 2011-07-05 MED ORDER — TRAMADOL HCL 50 MG PO TABS
50.0000 mg | ORAL_TABLET | Freq: Four times a day (QID) | ORAL | Status: DC | PRN
  Administered 2011-07-05 – 2011-07-16 (×13): 50 mg via ORAL
  Filled 2011-07-05 (×13): qty 1

## 2011-07-05 MED ORDER — METOPROLOL TARTRATE 25 MG/10 ML ORAL SUSPENSION: 12.5000 mg | Freq: Two times a day (BID) | ORAL | Status: DC

## 2011-07-05 MED ORDER — ONDANSETRON HCL 4 MG/2ML IJ SOLN: 4.0000 mg | Freq: Four times a day (QID) | INTRAMUSCULAR | Status: DC | PRN

## 2011-07-05 MED ORDER — METOPROLOL TARTRATE 25 MG/10 ML ORAL SUSPENSION
12.5000 mg | Freq: Two times a day (BID) | ORAL | Status: DC
  Administered 2011-07-06: 12.5 mg
  Filled 2011-07-05 (×6): qty 5

## 2011-07-05 MED ORDER — VANCOMYCIN HCL 1000 MG IV SOLR
1000.0000 mg | Freq: Once | INTRAVENOUS | Status: AC
  Administered 2011-07-05: 1000 mg via INTRAVENOUS
  Filled 2011-07-05: qty 1000

## 2011-07-05 MED ORDER — METOPROLOL TARTRATE 1 MG/ML IV SOLN
2.5000 mg | INTRAVENOUS | Status: DC | PRN
  Filled 2011-07-05: qty 5

## 2011-07-05 MED ORDER — PANTOPRAZOLE SODIUM 40 MG PO TBEC: 40.0000 mg | DELAYED_RELEASE_TABLET | Freq: Every day | ORAL | Status: DC

## 2011-07-05 MED ORDER — SODIUM CHLORIDE 0.9 % IV SOLN
0.1000 ug/kg/h | INTRAVENOUS | Status: DC
  Administered 2011-07-05: 0.7 ug/kg/h via INTRAVENOUS
  Filled 2011-07-05: qty 2

## 2011-07-05 MED ORDER — CALCIUM CHLORIDE 10 % IV SOLN
1.0000 g | Freq: Once | INTRAVENOUS | Status: AC | PRN
  Filled 2011-07-05: qty 10

## 2011-07-05 MED ORDER — MAGNESIUM SULFATE 40 MG/ML IJ SOLN: 4.0000 g | Freq: Once | INTRAMUSCULAR | Status: DC

## 2011-07-05 MED ORDER — SODIUM CHLORIDE 0.9 % IJ SOLN: 3.0000 mL | Freq: Two times a day (BID) | INTRAMUSCULAR | Status: DC

## 2011-07-05 MED ORDER — DOPAMINE-DEXTROSE 3.2-5 MG/ML-% IV SOLN: 0.0000 ug/kg/min | INTRAVENOUS | Status: DC

## 2011-07-05 MED ORDER — ALBUMIN HUMAN 5 % IV SOLN: 250.0000 mL | INTRAVENOUS | Status: DC | PRN

## 2011-07-05 MED ORDER — ACETAMINOPHEN 500 MG PO TABS: 1000.0000 mg | ORAL_TABLET | Freq: Four times a day (QID) | ORAL | Status: DC

## 2011-07-05 MED ORDER — INSULIN ASPART 100 UNIT/ML ~~LOC~~ SOLN: 0.0000 [IU] | SUBCUTANEOUS | Status: DC

## 2011-07-05 MED ORDER — MORPHINE SULFATE 4 MG/ML IJ SOLN: 2.0000 mg | INTRAMUSCULAR | Status: DC | PRN

## 2011-07-05 MED ORDER — INSULIN REGULAR BOLUS VIA INFUSION: 0.0000 [IU] | Freq: Three times a day (TID) | INTRAVENOUS | Status: DC

## 2011-07-05 MED ORDER — DEXTROSE 5 % IV SOLN
1.5000 g | Freq: Two times a day (BID) | INTRAVENOUS | Status: AC
  Administered 2011-07-05 – 2011-07-06 (×4): 1.5 g via INTRAVENOUS
  Filled 2011-07-05 (×4): qty 1.5

## 2011-07-05 MED ORDER — ASPIRIN EC 325 MG PO TBEC: 325.0000 mg | DELAYED_RELEASE_TABLET | Freq: Every day | ORAL | Status: DC

## 2011-07-05 MED ORDER — SODIUM CHLORIDE 0.9 % IV SOLN: 0.1000 ug/kg/h | INTRAVENOUS | Status: DC

## 2011-07-05 MED ORDER — PANTOPRAZOLE SODIUM 40 MG PO TBEC
40.0000 mg | DELAYED_RELEASE_TABLET | Freq: Every day | ORAL | Status: DC
  Administered 2011-07-06 – 2011-07-16 (×10): 40 mg via ORAL
  Filled 2011-07-05 (×8): qty 1

## 2011-07-05 MED ORDER — PHENYLEPHRINE HCL 10 MG/ML IJ SOLN
0.0000 ug/min | INTRAVENOUS | Status: DC
  Filled 2011-07-05: qty 2

## 2011-07-05 MED ORDER — ACETAMINOPHEN 160 MG/5ML PO SOLN: 650.0000 mg | ORAL | Status: DC

## 2011-07-05 MED ORDER — SODIUM CHLORIDE 0.9 % IJ SOLN: 3.0000 mL | INTRAMUSCULAR | Status: DC | PRN

## 2011-07-05 MED ORDER — METOPROLOL TARTRATE 12.5 MG HALF TABLET
12.5000 mg | ORAL_TABLET | Freq: Two times a day (BID) | ORAL | Status: DC
  Administered 2011-07-05 – 2011-07-06 (×2): 12.5 mg via ORAL
  Filled 2011-07-05 (×6): qty 1

## 2011-07-05 MED ORDER — MORPHINE SULFATE 2 MG/ML IJ SOLN
1.0000 mg | INTRAMUSCULAR | Status: DC | PRN
  Administered 2011-07-05 – 2011-07-06 (×9): 1 mg via INTRAVENOUS
  Filled 2011-07-05 (×7): qty 1

## 2011-07-05 MED ORDER — MORPHINE SULFATE 4 MG/ML IJ SOLN
2.0000 mg | INTRAMUSCULAR | Status: DC | PRN
  Administered 2011-07-05: 2 mg via INTRAVENOUS
  Filled 2011-07-05: qty 1

## 2011-07-05 MED ORDER — ACETAMINOPHEN 160 MG/5ML PO SOLN
975.0000 mg | Freq: Four times a day (QID) | ORAL | Status: AC
  Administered 2011-07-05: 975 mg
  Filled 2011-07-05 (×2): qty 20.3
  Filled 2011-07-05: qty 40.6

## 2011-07-05 MED ORDER — MIDAZOLAM HCL 2 MG/2ML IJ SOLN: 2.0000 mg | INTRAMUSCULAR | Status: DC | PRN

## 2011-07-05 MED ORDER — INSULIN GLARGINE 100 UNIT/ML ~~LOC~~ SOLN
28.0000 [IU] | Freq: Two times a day (BID) | SUBCUTANEOUS | Status: DC
  Administered 2011-07-05 – 2011-07-06 (×3): 28 [IU] via SUBCUTANEOUS

## 2011-07-05 MED ORDER — AMIODARONE HCL IN DEXTROSE 360-4.14 MG/200ML-% IV SOLN
30.0000 mg/h | INTRAVENOUS | Status: DC
  Administered 2011-07-05 (×2): 30 mg/h via INTRAVENOUS
  Administered 2011-07-05: 60 mg/h via INTRAVENOUS
  Filled 2011-07-05 (×5): qty 200

## 2011-07-05 MED ORDER — DOCUSATE SODIUM 100 MG PO CAPS
200.0000 mg | ORAL_CAPSULE | Freq: Every day | ORAL | Status: DC
  Administered 2011-07-06 – 2011-07-16 (×5): 200 mg via ORAL
  Filled 2011-07-05 (×10): qty 2

## 2011-07-05 MED ORDER — DEXTROSE 5 % IV SOLN: 1.5000 g | Freq: Two times a day (BID) | INTRAVENOUS | Status: DC

## 2011-07-05 MED ORDER — FAMOTIDINE IN NACL 20-0.9 MG/50ML-% IV SOLN: 20.0000 mg | Freq: Two times a day (BID) | INTRAVENOUS | Status: DC

## 2011-07-05 MED ORDER — MORPHINE SULFATE 2 MG/ML IJ SOLN: 1.0000 mg | INTRAMUSCULAR | Status: AC | PRN

## 2011-07-05 MED ORDER — INSULIN REGULAR BOLUS VIA INFUSION
0.0000 [IU] | Freq: Three times a day (TID) | INTRAVENOUS | Status: DC
  Filled 2011-07-05: qty 10

## 2011-07-05 MED ORDER — ACETAMINOPHEN 160 MG/5ML PO SOLN
650.0000 mg | ORAL | Status: AC
  Administered 2011-07-05: 650 mg

## 2011-07-05 MED ORDER — BISACODYL 5 MG PO TBEC: 10.0000 mg | DELAYED_RELEASE_TABLET | Freq: Every day | ORAL | Status: DC

## 2011-07-05 MED ORDER — MAGNESIUM SULFATE 40 MG/ML IJ SOLN
4.0000 g | Freq: Once | INTRAMUSCULAR | Status: AC
  Administered 2011-07-05: 4 g via INTRAVENOUS

## 2011-07-05 MED ORDER — PHENYLEPHRINE HCL 10 MG/ML IJ SOLN: 0.0000 ug/min | INTRAVENOUS | Status: DC

## 2011-07-05 MED ORDER — ONDANSETRON HCL 4 MG/2ML IJ SOLN
4.0000 mg | Freq: Four times a day (QID) | INTRAMUSCULAR | Status: DC | PRN
  Administered 2011-07-07: 4 mg via INTRAVENOUS
  Filled 2011-07-05: qty 2

## 2011-07-05 MED ORDER — ALBUMIN HUMAN 5 % IV SOLN
250.0000 mL | INTRAVENOUS | Status: AC | PRN
  Administered 2011-07-05 (×3): 250 mL via INTRAVENOUS

## 2011-07-05 NOTE — Progress Notes (Signed)
PT Cancellation Note  Treatment cancelled today due to medical issues with patient which prohibited therapy.  Patient still intubated.  Will follow up at later date.  Thanks.  INGOLD,Keyra Virella 07/05/2011, 2:23 PM  [facility] [phone] (pager)

## 2011-07-05 NOTE — Progress Notes (Signed)
Sheath removed from right groin and manual pressure applied to site. Patient tolerated well and no active bleeding or hematoma noted. Gauze dressing applied and secured with tape. Primary nurse in to evaluate condition of groin and absence of bleeding or hematoma. Instructions provided about limited movement to that extremity and patient still has limited activity due to other lines and tubes at this time.

## 2011-07-05 NOTE — Procedures (Signed)
Extubation Procedure Note  Patient Details:   Name: COLETA MACMURRAY DOB: 06-24-1945 MRN: [id number]   Airway Documentation:     Evaluation  O2 sats: stable throughout Complications: No apparent complications Patient did tolerate procedure well. Bilateral Breath Sounds: Clear;Diminished   Yes  Pt extubated per open heart protocol.  Nif -20, spontaneous VT 300 ml.  Pt unable to do VC.  Cuff leak positive prior to extubation.  Placed on 4l .  Pt tolerated well.  Will continue to monitor.  [facility] 07/05/2011, 1:37 PM

## 2011-07-05 NOTE — Progress Notes (Addendum)
   CARDIOTHORACIC SURGERY PROGRESS NOTE   R1 Day Post-Op Procedure(s) (LRB): CORONARY ARTERY BYPASS GRAFTING (CABG) (N/A) AORTIC VALVE REPLACEMENT (AVR) (N/A) MYOMECTOMY (N/A)  Subjective: Sleepy but arousable on vent.  Follows some simple commands.  Objective: Vital signs: BP Readings from Last 1 Encounters:  07/05/11 96/60   Pulse Readings from Last 1 Encounters:  07/05/11 80   Resp Readings from Last 1 Encounters:  07/05/11 65   Temp Readings from Last 1 Encounters:  07/05/11 99.7 F (37.6 C)     Hemodynamics: PAP: (26-50)/(14-29) 33/18 mmHg CO:  [2.6 L/min-3.5 L/min] 3.1 L/min CI:  [1.5 L/min/m2-2 L/min/m2] 1.8 L/min/m2  Physical Exam:  Rhythm:   sinus  Breath sounds: clear  Heart sounds:  RRR  Incisions:  Dressings dry  Abdomen:  Soft, non distended  Extremities:  Warm, well perfused   Intake/Output from previous day: 03/22 0701 - 03/23 0700 In: 6081.3 [I.V.:2807.3; Blood:2104; NG/GT:120; IV Piggyback:1050] Out: 4700 [Urine:2570; Emesis/NG output:100; Blood:1700; Chest Tube:330] Intake/Output this shift: Total I/O In: 238.8 [I.V.:138.8; IV Piggyback:100] Out: 150 [Urine:115; Chest Tube:35]  Lab Results:  Basename 07/05/11 0500 07/05/11 0001  WBC 10.4 7.9  HGB 11.2* 10.9*  HCT 32.8* 31.7*  PLT 146* 119*   BMET:  Basename 07/05/11 0500 07/05/11 07/03/11 1250  NA 137 141 --  K 4.2 3.7 --  CL 106 -- 105  CO2 20 -- 26  GLUCOSE 133* 138* --  BUN 10 -- 15  CREATININE 0.89 -- 1.11*  CALCIUM 7.8* -- 9.1    CBG (last 3)   Basename 07/05/11 0736 07/05/11 0652 07/05/11 0606  GLUCAP 113* 133* 150*   ABG    Component Value Date/Time   PHART 7.357 07/05/2011 0458   HCO3 21.2 07/05/2011 0458   TCO2 22 07/05/2011 0458   ACIDBASEDEF 4.0* 07/05/2011 0458   O2SAT 99.0 07/05/2011 0458   CXR: clear  Assessment/Plan: S/P Procedure(s) (LRB): CORONARY ARTERY BYPASS GRAFTING (CABG) (N/A) AORTIC VALVE REPLACEMENT (AVR) (N/A) MYOMECTOMY (N/A)  Stable  hemodynamics overnight Slow to wake up but neurologically appears intact Expected post op acute blood loss anemia, mild Expected post op volume excess, mild Preop PAF, maintaining NSR on Amiodarone DMII with good glycemic control but still requiring insulin drip   D/C femoral sheath  Wean vent and extubate  Wean dopamine  Mobilize post extubation  Start lantus insulin   Sherryl Valido H 07/05/2011 9:53 AM

## 2011-07-05 NOTE — Progress Notes (Signed)
TCTS BRIEF SICU PROGRESS NOTE  1 Day Post-Op  S/P Procedure(s) (LRB): CORONARY ARTERY BYPASS GRAFTING (CABG) (N/A) AORTIC VALVE REPLACEMENT (AVR) (N/A) MYOMECTOMY (N/A)   Stable day Extubated uneventfully Sinus rhythm with stable BP O2 sats 99% on 2 L/min via [place] UOP adequate  Plan: Continue current plan  Zaiah Eckerson H 07/05/2011 6:09 PM

## 2011-07-05 NOTE — Preoperative (Signed)
Beta Blockers   Reason not to administer Beta Blockers:Not Applicable 

## 2011-07-06 ENCOUNTER — Inpatient Hospital Stay (HOSPITAL_COMMUNITY): Payer: Medicare Other

## 2011-07-06 LAB — CBC
Hemoglobin: 10.7 g/dL — ABNORMAL LOW (ref 12.0–15.0)
MCHC: 34.1 g/dL (ref 30.0–36.0)
Platelets: 153 10*3/uL (ref 150–400)
RBC: 3.51 MIL/uL — ABNORMAL LOW (ref 3.87–5.11)

## 2011-07-06 LAB — GLUCOSE, CAPILLARY
Glucose-Capillary: 183 mg/dL — ABNORMAL HIGH (ref 70–99)
Glucose-Capillary: 147 mg/dL — ABNORMAL HIGH (ref 70–99)
Glucose-Capillary: 109 mg/dL — ABNORMAL HIGH (ref 70–99)
Glucose-Capillary: 87 mg/dL (ref 70–99)
Glucose-Capillary: 101 mg/dL — ABNORMAL HIGH (ref 70–99)
Glucose-Capillary: 91 mg/dL (ref 70–99)
Glucose-Capillary: 112 mg/dL — ABNORMAL HIGH (ref 70–99)
Glucose-Capillary: 84 mg/dL (ref 70–99)
Glucose-Capillary: 83 mg/dL (ref 70–99)
Glucose-Capillary: 102 mg/dL — ABNORMAL HIGH (ref 70–99)
Glucose-Capillary: 116 mg/dL — ABNORMAL HIGH (ref 70–99)

## 2011-07-06 LAB — BASIC METABOLIC PANEL
GFR calc Af Amer: 52 mL/min — ABNORMAL LOW (ref >90)
GFR calc non Af Amer: 45 mL/min — ABNORMAL LOW (ref >90)
Potassium: 3.3 mEq/L — ABNORMAL LOW (ref 3.5–5.1)
Sodium: 139 mEq/L (ref 135–145)

## 2011-07-06 MED ORDER — INSULIN ASPART 100 UNIT/ML ~~LOC~~ SOLN: 0.0000 [IU] | SUBCUTANEOUS | Status: AC

## 2011-07-06 MED ORDER — FUROSEMIDE 10 MG/ML IJ SOLN
40.0000 mg | Freq: Four times a day (QID) | INTRAMUSCULAR | Status: AC
  Administered 2011-07-06 – 2011-07-07 (×3): 40 mg via INTRAVENOUS
  Filled 2011-07-06 (×3): qty 4

## 2011-07-06 MED ORDER — INSULIN GLARGINE 100 UNIT/ML ~~LOC~~ SOLN
20.0000 [IU] | Freq: Once | SUBCUTANEOUS | Status: AC
  Administered 2011-07-06: 20 [IU] via SUBCUTANEOUS

## 2011-07-06 MED ORDER — POTASSIUM CHLORIDE 10 MEQ/50ML IV SOLN
10.0000 meq | INTRAVENOUS | Status: AC | PRN
  Administered 2011-07-06 (×3): 10 meq via INTRAVENOUS

## 2011-07-06 MED ORDER — ENOXAPARIN SODIUM 30 MG/0.3ML ~~LOC~~ SOLN
30.0000 mg | SUBCUTANEOUS | Status: DC
  Administered 2011-07-07 – 2011-07-09 (×3): 30 mg via SUBCUTANEOUS
  Filled 2011-07-06 (×5): qty 0.3

## 2011-07-06 MED ORDER — INSULIN GLARGINE 100 UNIT/ML ~~LOC~~ SOLN
36.0000 [IU] | Freq: Two times a day (BID) | SUBCUTANEOUS | Status: DC
  Administered 2011-07-06 – 2011-07-10 (×8): 36 [IU] via SUBCUTANEOUS

## 2011-07-06 MED ORDER — INSULIN ASPART 100 UNIT/ML ~~LOC~~ SOLN: 0.0000 [IU] | SUBCUTANEOUS | Status: DC

## 2011-07-06 MED ORDER — POTASSIUM CHLORIDE 10 MEQ/50ML IV SOLN
10.0000 meq | INTRAVENOUS | Status: AC
  Administered 2011-07-06 (×3): 10 meq via INTRAVENOUS
  Filled 2011-07-06 (×3): qty 50

## 2011-07-06 MED ORDER — AMIODARONE HCL 200 MG PO TABS
200.0000 mg | ORAL_TABLET | Freq: Two times a day (BID) | ORAL | Status: DC
  Administered 2011-07-06 – 2011-07-13 (×16): 200 mg via ORAL
  Filled 2011-07-06 (×21): qty 1

## 2011-07-06 MED ORDER — LACTATED RINGERS IV SOLN
INTRAVENOUS | Status: DC
  Administered 2011-07-06: 20 mL via INTRAVENOUS

## 2011-07-06 NOTE — Progress Notes (Signed)
Physical Therapy Evaluation Patient Details Name: Melinda Vang MRN: [id number] DOB: [birth year] Today's Date: 07/06/2011  Problem List:  Patient Active Problem List  Diagnoses  . NSTEMI (non-ST elevated myocardial infarction)  . DM (diabetes mellitus)  . Dyslipidemia  . Atrial fibrillation  . Anemia  . S/P CABG x 4  . S/P AVR (aortic valve replacement)    Past Medical History:  Past Medical History  Diagnosis Date  . Heart murmur   . Angina   . Hypertension   . Diabetes mellitus   . Peripheral vascular disease     per family  . Shortness of breath   . Anemia     hx of anemia  . Cancer     rectal cancer history  . GERD (gastroesophageal reflux disease)   . Gout     hx of   . Anxiety   . Developmental delay   . 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 S/P AVR (aortic valve replacement) 07/05/2011    19 mm [employer] Ease pericardial tissue valve with septal myomectomy   Past Surgical History:  Past Surgical History  Procedure Date  . Abdominal hysterectomy   . Colonoscopy w/ biopsies and polypectomy   . Low anterior bowel resection 2011    rectal cancer    PT Assessment/Plan/Recommendation PT Assessment Clinical Impression Statement: pt presents s/p CABG x4 and AVR.  pt with known developmental delay and very anxious with any mobility.  pt difficult to keep on task and repeating that she is dying and praying that God will just let her go.  Despite many re-directions pt still calling out that she is dying and that her heart is broken in half.  After settled in chair pt becomes calm and falls asleep.  pt is not safe for D/C to home.  pt needs 24hr skilled care.  pt will need SNF at D/C.   PT Recommendation/Assessment: Patient will need skilled PT in the acute care venue PT Problem List: Decreased strength;Decreased activity tolerance;Decreased balance;Decreased mobility;Decreased knowledge of use of  DME;Decreased safety awareness;Decreased knowledge of precautions;Cardiopulmonary status limiting activity;Pain;Decreased cognition Barriers to Discharge: Decreased caregiver support PT Therapy Diagnosis : Difficulty walking;Acute pain PT Plan PT Frequency: Min 3X/week PT Treatment/Interventions: DME instruction;Gait training;Stair training;Functional mobility training;Therapeutic activities;Therapeutic exercise;Balance training;Patient/family education PT Recommendation Recommendations for Other Services: OT consult Follow Up Recommendations: Skilled nursing facility Equipment Recommended: Defer to next venue PT Goals  Acute Rehab PT Goals PT Goal Formulation: With patient Time For Goal Achievement: 2 weeks Pt will Roll Supine to Right Side: with min assist PT Goal: Rolling Supine to Right Side - Progress: Goal set today Pt will Roll Supine to Left Side: with min assist PT Goal: Rolling Supine to Left Side - Progress: Goal set today Pt will go Supine/Side to Sit: with min assist PT Goal: Supine/Side to Sit - Progress: Goal set today Pt will go Sit to Supine/Side: with min assist PT Goal: Sit to Supine/Side - Progress: Goal set today Pt will go Sit to Stand: with min assist PT Goal: Sit to Stand - Progress: Goal set today Pt will go Stand to Sit: with min assist PT Goal: Stand to Sit - Progress: Goal set today Pt will Ambulate: 51 - 150 feet;with min assist;with rolling walker PT Goal: Ambulate - Progress: Goal set today Additional Goals Additional Goal #1: pt will verbalize sternal precautions.  PT Goal: Additional Goal #1 - Progress: Goal set today  PT Evaluation Precautions/Restrictions  Precautions Precautions: Sternal Precaution Comments: pt with cognitive delay.   Required Braces or Orthoses: No Restrictions Weight Bearing Restrictions: No Prior Functioning  Home Living Lives With: Alone Receives Help From: Family Type of Home: House Home Layout: One level Home  Adaptive Equipment: Straight cane;Walker - rolling Additional Comments: pt notes she has a cousin who A near daily with bathing, chores and errands.   Prior Function Level of Independence: Independent with gait;Independent with transfers;Needs assistance with ADLs;Needs assistance with homemaking Able to Take Stairs?: Yes Driving: No Cognition Cognition Orientation Level: Oriented to person;Oriented to place;Appropriate for developmental age Cognition - Other Comments: pt with developmental delay Sensation/Coordination   Extremity Assessment RLE Assessment RLE Assessment: Exceptions to [facility] RLE Strength RLE Overall Strength Comments: Difficult to assess as pt not following directions consistently and difficult to keep focused on task.  pt grossly </=4/5 LLE Assessment LLE Assessment: Exceptions to [facility] LLE Strength LLE Overall Strength Comments: Again difficult to assess, but grossly </=4/5 Mobility (including Balance) Bed Mobility Bed Mobility: No Transfers Transfers: Yes Sit to Stand: 3: Mod assist;From chair/3-in-1 Sit to Stand Details (indicate cue type and reason): Max cueing and encouragement for attention to task, use of UEs on knees, safety.  pt anxious and difficult to focus.   Stand to Sit: 3: Mod assist;To chair/3-in-1 Stand to Sit Details: Max cueing for safety, technique, attention to task.   Ambulation/Gait Ambulation/Gait: No Stairs: No Wheelchair Mobility Wheelchair Mobility: No  Posture/Postural Control Posture/Postural Control: No significant limitations Balance Balance Assessed: Yes Static Standing Balance Static Standing - Balance Support: Bilateral upper extremity supported Static Standing - Level of Assistance: 3: Mod assist Static Standing - Comment/# of Minutes: pt requires Max cueing to attend to task and safety, pt leaning on RW and calling out that she is dying and praying.   Exercise    End of Session PT - End of Session Equipment Utilized  During Treatment: Gait belt Activity Tolerance:  (Limited by anxiety and attention) Patient left: in chair;with call bell in reach Nurse Communication: Mobility status for transfers General Behavior During Session:  (Anxious and difficult to focus.  ) Cognition: Impaired, at baseline  Catarina Hartshorn, [place] 07/06/2011, 8:56 AM

## 2011-07-06 NOTE — Progress Notes (Signed)
   CARDIOTHORACIC SURGERY PROGRESS NOTE   R2 Days Post-Op Procedure(s) (LRB): CORONARY ARTERY BYPASS GRAFTING (CABG) (N/A) AORTIC VALVE REPLACEMENT (AVR) (N/A) MYOMECTOMY (N/A)  Subjective: Alert and cooperative.  Mental status at baseline.  Objective: Vital signs: BP Readings from Last 1 Encounters:  07/06/11 116/78   Pulse Readings from Last 1 Encounters:  07/06/11 81   Resp Readings from Last 1 Encounters:  07/06/11 16   Temp Readings from Last 1 Encounters:  07/06/11 97.4 F (36.3 C) Oral    Hemodynamics: PAP: (29-49)/(17-36) 41/29 mmHg CO:  [2.5 L/min-3.3 L/min] 2.7 L/min CI:  [1.5 L/min/m2-2 L/min/m2] 1.6 L/min/m2  Physical Exam:  Rhythm:   sinus  Breath sounds: clear  Heart sounds:  RRR  Incisions:  Dressings dry  Abdomen:  Soft, non tender  Extremities:  Warm, well perfused, severe bilateral LE edema (chronic)   Intake/Output from previous day: 03/23 0701 - 03/24 0700 In: 1527.2 [I.V.:1217.2; NG/GT:60; IV Piggyback:250] Out: 1390 [Urine:845; Chest Tube:545] Intake/Output this shift: Total I/O In: 244 [I.V.:144; IV Piggyback:100] Out: 100 [Urine:50; Chest Tube:50]  Lab Results:  Basename 07/06/11 0416 07/05/11 1704 07/05/11 1700  WBC 11.0* -- 10.4  HGB 10.7* 10.5* --  HCT 31.4* 31.0* --  PLT 153 -- 143*   BMET:  Basename 07/06/11 0416 07/05/11 1704 07/05/11 0500  NA 139 142 --  K 3.3* 3.6 --  CL 107 109 --  CO2 21 -- 20  GLUCOSE 132* 132* --  BUN 14 10 --  CREATININE 1.22* 1.20* --  CALCIUM 8.1* -- 7.8*    CBG (last 3)   Basename 07/06/11 1009 07/06/11 0915 07/06/11 0806  GLUCAP 88 112* 91   ABG    Component Value Date/Time   PHART 7.366 07/05/2011 1322   HCO3 20.5 07/05/2011 1322   TCO2 19 07/05/2011 1704   ACIDBASEDEF 4.0* 07/05/2011 1322   O2SAT 99.0 07/05/2011 1322   CXR: Bibasilar atelectasis, mild congestion  Assessment/Plan: S/P Procedure(s) (LRB): CORONARY ARTERY BYPASS GRAFTING (CABG) (N/A) AORTIC VALVE REPLACEMENT  (AVR) (N/A) MYOMECTOMY (N/A)  Overall doing well POD2 Mental status at baseline - will definitely impact her care and recovery Expected post op acute blood loss anemia, mild, stable Expected post op volume excess, mild-moderate Preop atrial fibrillation, maintaining NSR on amiodarone Type II diabetes mellitus, glycemic control excellent but still on insulin drip Very limited mobility   Mobilize  Diuresis  D/C chest tubes  Increase lantus insulin  Convert amiodarone to po  PT consult  Lovenox for DVT prophylaxis   Evalie Hargraves H 07/06/2011 11:11 AM

## 2011-07-06 NOTE — Progress Notes (Signed)
Subjective:   Pt is a 66 yo with 3 v CAD and  AS.  She is s/p CABG and AVR ( pericardial tissue valve)  Extubated, doing well      . acetaminophen  1,000 mg Oral Q6H   Or  . acetaminophen (TYLENOL) oral liquid 160 mg/5 mL  975 mg Per Tube Q6H  . albumin human      . amiodarone  200 mg Oral BID PC  . aspirin EC  325 mg Oral Daily   Or  . aspirin  324 mg Per Tube Daily  . atorvastatin  20 mg Oral q1800  . bisacodyl  10 mg Oral Daily   Or  . bisacodyl  10 mg Rectal Daily  . cefUROXime (ZINACEF)  IV  1.5 g Intravenous Q12H  . docusate sodium  200 mg Oral Daily  . enoxaparin (LOVENOX) injection  30 mg Subcutaneous Q24H  . furosemide  40 mg Intravenous Q6H  . insulin glargine  20 Units Subcutaneous Once  . insulin glargine  36 Units Subcutaneous BID  . insulin regular  0-10 Units Intravenous TID WC  . metoprolol tartrate  12.5 mg Oral BID   Or  . metoprolol tartrate  12.5 mg Per Tube BID  . pantoprazole  40 mg Oral Q1200  . potassium chloride  10 mEq Intravenous Q1 Hr x 3  . sodium chloride  3 mL Intravenous Q12H  . DISCONTD: insulin aspart  0-24 Units Subcutaneous Q4H  . DISCONTD: insulin glargine  28 Units Subcutaneous BID      . sodium chloride 20 mL/hr at 07/05/11 0019  . sodium chloride    . sodium chloride    . insulin (NOVOLIN-R) infusion 2.3 Units/hr (07/06/11 1218)  . DISCONTD: amiodarone (NEXTERONE PREMIX) 360 mg/200 mL dextrose 30 mg/hr (07/06/11 1100)  . DISCONTD: DOPamine Stopped (07/05/11 1643)  . DISCONTD: lactated ringers 20 mL/hr at 07/06/11 1100  . DISCONTD: nitroGLYCERIN Stopped (07/05/11 0017)    Objective:  Vital Signs in the last 24 hours: Blood pressure 130/84, pulse 78, temperature 97 F (36.1 C), temperature source Axillary, resp. rate 18, height 4\' 10"  (1.473 m), weight 186 lb 4.6 oz (84.5 kg), SpO2 97.00%. Temp:  [97 F (36.1 C)-99.5 F (37.5 C)] 97 F (36.1 C) (03/24 1211) Pulse Rate:  [61-82] 78  (03/24 1100) Resp:  [16-31] 18   (03/24 1100) BP: (97-130)/(64-91) 130/84 mmHg (03/24 1100) SpO2:  [93 %-100 %] 97 % (03/24 1100) Arterial Line BP: (118-166)/(64-82) 150/78 mmHg (03/23 1800) FiO2 (%):  [40.1 %] 40.1 % (03/23 1330) Weight:  [186 lb 4.6 oz (84.5 kg)] 186 lb 4.6 oz (84.5 kg) (03/24 0600)  Intake/Output from previous day: 03/23 0701 - 03/24 0700 In: 1527.2 [I.V.:1217.2; NG/GT:60; IV Piggyback:250] Out: 1390 [Urine:845; Chest Tube:545] Intake/Output from this shift: Total I/O In: 487.1 [P.O.:100; I.V.:183.1; IV Piggyback:204] Out: 225 [Urine:175; Chest Tube:50]  Physical Exam:  Physical Exam: Blood pressure 130/84, pulse 78, temperature 97 F (36.1 C), temperature source Axillary, resp. rate 18, height 4\' 10"  (1.473 m), weight 186 lb 4.6 oz (84.5 kg), SpO2 97.00%. General: slightly sluggish Head: Normocephalic, atraumatic, sclera non-icteric, mucus membranes are moist,  Neck: left IJ in place Lungs: poor inspiratory effort. Heart: Regular rate,  With normal  S1 S2. Soft murmur. Abdomen: Soft, non-tender, non-distended with normoactive bowel sounds. No hepatomegaly. No rebound/guarding. No abdominal masses. Msk:  Strength and tone appear normal for age. Extremities: trace - 1+ diffuse arm and leg edeam Neuro: Alert and  oriented X 3. Moves all extremities spontaneously. Psych:  Responds to questions appropriately,     Lab Results:   Basename 07/06/11 0416 07/05/11 1704 07/05/11 1700 07/05/11 0500  NA 139 142 -- --  K 3.3* 3.6 -- --  CL 107 109 -- --  CO2 21 -- -- 20  GLUCOSE 132* 132* -- --  BUN 14 10 -- --  CREATININE 1.22* 1.20* -- --  CALCIUM 8.1* -- -- 7.8*  MG -- -- 2.8* 3.5*  PHOS -- -- -- --   No results found for this basename: AST:2,ALT:2,ALKPHOS:2,BILITOT:2,PROT:2,ALBUMIN:2 in the last 72 hours No results found for this basename: LIPASE:2,AMYLASE:2 in the last 72 hours  Basename 07/06/11 0416 07/05/11 1704 07/05/11 1700  WBC 11.0* -- 10.4  NEUTROABS -- -- --  HGB 10.7* 10.5*  --  HCT 31.4* 31.0* --  MCV 89.5 -- 89.5  PLT 153 -- 143*    Basename 07/04/11 1156 07/04/11 0600 07/03/11 2357 07/03/11 1729  CKTOTAL 81 79 102 138  CKMB 2.4 2.7 3.8 7.0*  TROPONINI 1.88* 2.44* 2.92* 4.32*   No components found with this basename: POCBNP:3  Basename 07/04/11 2252  DDIMER 0.72*   No results found for this basename: HGBA1C in the last 72 hours  Basename 07/04/11 0600  CHOL 200  HDL 48  LDLCALC 101*  TRIG 257*  CHOLHDL 4.2   No results found for this basename: TSH,T4TOTAL,FREET3,T3FREE,THYROIDAB in the last 72 hours  Basename 07/04/11 1157  VITAMINB12 122*  FOLATE >20.0  FERRITIN 75  TIBC 264  IRON 47  RETICCTPCT 2.4     Tele:  NSR  Assessment/Plan:   1. CAD : s/p CABG 2. AS: s/p AVR  Rhythm is stable   Thayer Headings, Brooke Bonito., MD, [facility] 07/06/2011, 1:29 PM LOS: Day 3

## 2011-07-06 NOTE — Progress Notes (Signed)
TCTS BRIEF SICU PROGRESS NOTE  2 Days Post-Op  S/P Procedure(s) (LRB): CORONARY ARTERY BYPASS GRAFTING (CABG) (N/A) AORTIC VALVE REPLACEMENT (AVR) (N/A) MYOMECTOMY (N/A)   Stable day Needs tremendous encouragement  Plan: Continue current plan  Rexene Alberts 07/06/2011 7:34 PM

## 2011-07-07 ENCOUNTER — Inpatient Hospital Stay (HOSPITAL_COMMUNITY): Payer: Medicare Other

## 2011-07-07 ENCOUNTER — Encounter (HOSPITAL_COMMUNITY): Payer: Self-pay | Admitting: Thoracic Surgery (Cardiothoracic Vascular Surgery)

## 2011-07-07 LAB — BASIC METABOLIC PANEL
CO2: 22 mEq/L (ref 19–32)
Chloride: 103 mEq/L (ref 96–112)
Sodium: 135 mEq/L (ref 135–145)

## 2011-07-07 LAB — GLUCOSE, CAPILLARY
Glucose-Capillary: 85 mg/dL (ref 70–99)
Glucose-Capillary: 98 mg/dL (ref 70–99)
Glucose-Capillary: 184 mg/dL — ABNORMAL HIGH (ref 70–99)
Glucose-Capillary: 142 mg/dL — ABNORMAL HIGH (ref 70–99)
Glucose-Capillary: 108 mg/dL — ABNORMAL HIGH (ref 70–99)

## 2011-07-07 LAB — POCT I-STAT 4, (NA,K, GLUC, HGB,HCT)
HCT: 27 % — ABNORMAL LOW (ref 36.0–46.0)
Hemoglobin: 9.2 g/dL — ABNORMAL LOW (ref 12.0–15.0)
Sodium: 138 mEq/L (ref 135–145)

## 2011-07-07 LAB — CBC
Platelets: 177 10*3/uL (ref 150–400)
RBC: 3.39 MIL/uL — ABNORMAL LOW (ref 3.87–5.11)
WBC: 11.8 10*3/uL — ABNORMAL HIGH (ref 4.0–10.5)

## 2011-07-07 MED ORDER — SODIUM CHLORIDE 0.9 % IJ SOLN
10.0000 mL | Freq: Two times a day (BID) | INTRAMUSCULAR | Status: DC
  Administered 2011-07-13: 20 mL
  Administered 2011-07-16: 10 mL

## 2011-07-07 MED ORDER — METOPROLOL TARTRATE 25 MG PO TABS
25.0000 mg | ORAL_TABLET | Freq: Two times a day (BID) | ORAL | Status: DC
  Administered 2011-07-07 – 2011-07-09 (×6): 25 mg via ORAL
  Filled 2011-07-07 (×8): qty 1

## 2011-07-07 MED ORDER — POTASSIUM CHLORIDE CRYS ER 20 MEQ PO TBCR
20.0000 meq | EXTENDED_RELEASE_TABLET | Freq: Two times a day (BID) | ORAL | Status: DC
  Administered 2011-07-07 (×2): 20 meq via ORAL
  Filled 2011-07-07 (×4): qty 1

## 2011-07-07 MED ORDER — WARFARIN - PHYSICIAN DOSING INPATIENT: Freq: Every day | Status: DC

## 2011-07-07 MED ORDER — FUROSEMIDE 40 MG PO TABS
40.0000 mg | ORAL_TABLET | Freq: Two times a day (BID) | ORAL | Status: DC
  Administered 2011-07-08: 40 mg via ORAL
  Filled 2011-07-07 (×3): qty 1

## 2011-07-07 MED ORDER — SODIUM CHLORIDE 0.9 % IJ SOLN
10.0000 mL | INTRAMUSCULAR | Status: DC | PRN
  Administered 2011-07-08 – 2011-07-10 (×3): 20 mL
  Administered 2011-07-11 (×3): 10 mL
  Administered 2011-07-12 – 2011-07-13 (×2): 20 mL
  Administered 2011-07-15 – 2011-07-16 (×4): 10 mL

## 2011-07-07 MED ORDER — AMIODARONE HCL IN DEXTROSE 360-4.14 MG/200ML-% IV SOLN: 1.0000 mg/min | INTRAVENOUS | Status: DC

## 2011-07-07 MED ORDER — AMIODARONE LOAD VIA INFUSION
150.0000 mg | Freq: Once | INTRAVENOUS | Status: AC
  Administered 2011-07-07: 150 mg via INTRAVENOUS
  Filled 2011-07-07: qty 83.34

## 2011-07-07 MED ORDER — SODIUM CHLORIDE 0.9 % IV SOLN
250.0000 mL | INTRAVENOUS | Status: DC | PRN
  Administered 2011-07-14: 250 mL via INTRAVENOUS

## 2011-07-07 MED ORDER — INSULIN ASPART 100 UNIT/ML ~~LOC~~ SOLN
0.0000 [IU] | Freq: Three times a day (TID) | SUBCUTANEOUS | Status: DC
  Administered 2011-07-07: 2 [IU] via SUBCUTANEOUS
  Administered 2011-07-07: 4 [IU] via SUBCUTANEOUS
  Administered 2011-07-08 (×2): 2 [IU] via SUBCUTANEOUS
  Administered 2011-07-08: 4 [IU] via SUBCUTANEOUS
  Administered 2011-07-10 – 2011-07-14 (×8): 2 [IU] via SUBCUTANEOUS
  Administered 2011-07-14: 4 [IU] via SUBCUTANEOUS
  Administered 2011-07-15: 2 [IU] via SUBCUTANEOUS
  Administered 2011-07-15: 4 [IU] via SUBCUTANEOUS
  Administered 2011-07-15 – 2011-07-16 (×4): 2 [IU] via SUBCUTANEOUS

## 2011-07-07 MED ORDER — ASPIRIN EC 81 MG PO TBEC
81.0000 mg | DELAYED_RELEASE_TABLET | Freq: Every day | ORAL | Status: DC
  Administered 2011-07-07 – 2011-07-16 (×10): 81 mg via ORAL
  Filled 2011-07-07 (×10): qty 1

## 2011-07-07 MED ORDER — WARFARIN SODIUM 2 MG PO TABS
2.0000 mg | ORAL_TABLET | Freq: Every day | ORAL | Status: DC
  Administered 2011-07-07 – 2011-07-09 (×3): 2 mg via ORAL
  Filled 2011-07-07 (×4): qty 1

## 2011-07-07 MED ORDER — AMIODARONE HCL IN DEXTROSE 360-4.14 MG/200ML-% IV SOLN
30.0000 mg/h | INTRAVENOUS | Status: AC
  Administered 2011-07-07: 30 mg/h via INTRAVENOUS
  Filled 2011-07-07: qty 200

## 2011-07-07 MED ORDER — AMIODARONE HCL IN DEXTROSE 360-4.14 MG/200ML-% IV SOLN
0.5000 mg/min | INTRAVENOUS | Status: DC
  Administered 2011-07-07: 0.5 mg/min via INTRAVENOUS
  Filled 2011-07-07 (×3): qty 200

## 2011-07-07 MED ORDER — FUROSEMIDE 10 MG/ML IJ SOLN
40.0000 mg | Freq: Two times a day (BID) | INTRAMUSCULAR | Status: AC
  Administered 2011-07-07 (×2): 40 mg via INTRAVENOUS
  Filled 2011-07-07 (×2): qty 4

## 2011-07-07 MED ORDER — AMIODARONE HCL IN DEXTROSE 360-4.14 MG/200ML-% IV SOLN: 0.5000 mg/min | INTRAVENOUS | Status: DC

## 2011-07-07 MED ORDER — SODIUM CHLORIDE 0.9 % IJ SOLN
3.0000 mL | Freq: Two times a day (BID) | INTRAMUSCULAR | Status: DC
  Administered 2011-07-07 – 2011-07-16 (×11): 3 mL via INTRAVENOUS

## 2011-07-07 MED ORDER — SODIUM CHLORIDE 0.9 % IJ SOLN: 3.0000 mL | INTRAMUSCULAR | Status: DC | PRN

## 2011-07-07 MED ORDER — MOVING RIGHT ALONG BOOK
Freq: Once | Status: AC
  Administered 2011-07-09: 08:00:00
  Filled 2011-07-07: qty 1

## 2011-07-07 MED FILL — Heparin Sodium (Porcine) Inj 1000 Unit/ML: INTRAMUSCULAR | Qty: 10 | Status: AC

## 2011-07-07 MED FILL — Verapamil HCl IV Soln 2.5 MG/ML: INTRAVENOUS | Qty: 4 | Status: AC

## 2011-07-07 MED FILL — Potassium Chloride Inj 2 mEq/ML: INTRAVENOUS | Qty: 40 | Status: AC

## 2011-07-07 MED FILL — Nitroglycerin IV Soln 5 MG/ML: INTRAVENOUS | Qty: 10 | Status: AC

## 2011-07-07 MED FILL — Lactated Ringer's Solution: INTRAVENOUS | Qty: 500 | Status: AC

## 2011-07-07 MED FILL — Dexmedetomidine HCl IV Soln 200 MCG/2ML: INTRAVENOUS | Qty: 2 | Status: AC

## 2011-07-07 MED FILL — Magnesium Sulfate Inj 50%: INTRAMUSCULAR | Qty: 10 | Status: AC

## 2011-07-07 NOTE — Progress Notes (Signed)
Had difficulty threading guidewires via basilic and cephalic veins while inserting PICC via RT arm.   Met resistance in shoulder area.   On 2nd attempt via basilic vein was able to thread guidewire and PICC without difficulty.

## 2011-07-07 NOTE — Anesthesia Postprocedure Evaluation (Signed)
  Anesthesia Post-op Note  Patient: Melinda Vang  Procedure(s) Performed: Procedure(s) (LRB): CORONARY ARTERY BYPASS GRAFTING (CABG) (N/A) AORTIC VALVE REPLACEMENT (AVR) (N/A) MYOMECTOMY (N/A)  Patient Location: SICU  Anesthesia Type: General  Level of Consciousness: awake, alert , oriented and patient cooperative  Airway and Oxygen Therapy: Patient Spontanous Breathing  Post-op Pain: moderate  Post-op Assessment: Post-op Vital signs reviewed, Patient's Cardiovascular Status Stable, Respiratory Function Stable, Patent Airway and No signs of Nausea or vomiting  Post-op Vital Signs: Reviewed and stable  Complications: No apparent anesthesia complications

## 2011-07-07 NOTE — Progress Notes (Signed)
UR Completed.  Melinda Vang [phone] 07/07/2011  

## 2011-07-07 NOTE — Progress Notes (Signed)
Patient ID: Melinda Vang, female   DOB: [birth year], 66 y.o.   MRN: [id number]   Filed Vitals:   07/07/11 1600 07/07/11 1700 07/07/11 1800 07/07/11 1900  BP: 116/75 128/94  138/93  Pulse: 89 95 82 87  Temp:      TempSrc:      Resp: 20 21 21 22   Height:      Weight:      SpO2: 95% 96% 96% 95%   A-fib today but back in sinus now.  Urine output ok Had PICC placed today.

## 2011-07-07 NOTE — Progress Notes (Signed)
Order received to review patient's medications since starting amiodarone.  Only significant interaction noted is Coumadin.  Coumadin to begin this evening at low dose and daily INR is ordered.  Will monitor along with you.  Edwyna Shell, Pharm.D., BCPS Clinical Pharmacist Pager: [phone]

## 2011-07-07 NOTE — Progress Notes (Signed)
   CARDIOTHORACIC SURGERY PROGRESS NOTE   R3 Days Post-Op Procedure(s) (LRB): CORONARY ARTERY BYPASS GRAFTING (CABG) (N/A) AORTIC VALVE REPLACEMENT (AVR) (N/A) MYOMECTOMY (N/A)  Subjective: Feels better.  Presently denies pain.  Objective: Vital signs: BP Readings from Last 1 Encounters:  07/07/11 133/81   Pulse Readings from Last 1 Encounters:  07/07/11 106   Resp Readings from Last 1 Encounters:  07/07/11 17   Temp Readings from Last 1 Encounters:  07/07/11 97 F (36.1 C) Axillary    Hemodynamics:    Physical Exam:  Rhythm:   Afib with HR 90-110  Breath sounds: Diminished at bases  Heart sounds:  irreg  Incisions:  Dressings dry  Abdomen:  Soft, non tender  Extremities:  Warm, well perfused   Intake/Output from previous day: 03/24 0701 - 03/25 0700 In: 1156.8 [P.O.:380; I.V.:464.8; IV Piggyback:312] Out: [id number] [Urine:1420; Chest Tube:50] Intake/Output this shift: Total I/O In: 20 [I.V.:20] Out: 150 [Urine:150]  Lab Results:  Basename 07/07/11 0327 07/06/11 0416  WBC 11.8* 11.0*  HGB 10.2* 10.7*  HCT 30.7* 31.4*  PLT 177 153   BMET:  Basename 07/07/11 0327 07/06/11 0416  NA 135 139  K 4.0 3.3*  CL 103 107  CO2 22 21  GLUCOSE 102* 132*  BUN 22 14  CREATININE 1.41* 1.22*  CALCIUM 8.1* 8.1*    CBG (last 3)   Basename 07/07/11 0720 07/07/11 0410 07/07/11 0006  GLUCAP 98 85 117*   ABG    Component Value Date/Time   PHART 7.366 07/05/2011 1322   HCO3 20.5 07/05/2011 1322   TCO2 19 07/05/2011 1704   ACIDBASEDEF 4.0* 07/05/2011 1322   O2SAT 99.0 07/05/2011 1322   CXR: Looks good with mild bibasilar atelectasis and tiny effusions  Assessment/Plan: S/P Procedure(s) (LRB): CORONARY ARTERY BYPASS GRAFTING (CABG) (N/A) AORTIC VALVE REPLACEMENT (AVR) (N/A) MYOMECTOMY (N/A)  Overall doing well POD3 Pre and postop Afib Expected post op acute blood loss anemia, mild, stable Expected post op volume excess, mild, diuresing Post op acute renal  insufficiency, mild Type II diabetes mellitus, glycemic control excellent on lantus + SSI Limited cognitive ability and very limited physical mobility pre and postop   Restart amiodarone drip  Increase metoprolol  Place PICC and d/c central line  Diuresis  Mobilize  PT/OT  Start low dose coumadin  Lovenox for DVT prophylaxis until INR increases  Advance diet and change CBG's/SSI to ac/HS   Mirna Sutcliffe H 07/07/2011 8:42 AM

## 2011-07-08 LAB — TYPE AND SCREEN
Antibody Screen: NEGATIVE
Unit division: 0
Unit division: 0
Unit division: 0
Unit division: 0
Unit division: 0

## 2011-07-08 LAB — CBC
HCT: 30.8 % — ABNORMAL LOW (ref 36.0–46.0)
Hemoglobin: 10.2 g/dL — ABNORMAL LOW (ref 12.0–15.0)
MCH: 30.4 pg (ref 26.0–34.0)
MCHC: 33.1 g/dL (ref 30.0–36.0)
MCV: 91.7 fL (ref 78.0–100.0)
RDW: 15.1 % (ref 11.5–15.5)

## 2011-07-08 LAB — GLUCOSE, CAPILLARY
Glucose-Capillary: 80 mg/dL (ref 70–99)
Glucose-Capillary: 135 mg/dL — ABNORMAL HIGH (ref 70–99)

## 2011-07-08 LAB — BASIC METABOLIC PANEL
BUN: 27 mg/dL — ABNORMAL HIGH (ref 6–23)
Creatinine, Ser: 1.31 mg/dL — ABNORMAL HIGH (ref 0.50–1.10)
GFR calc Af Amer: 48 mL/min — ABNORMAL LOW (ref >90)
GFR calc non Af Amer: 41 mL/min — ABNORMAL LOW (ref >90)
Glucose, Bld: 99 mg/dL (ref 70–99)
Potassium: 3.7 mEq/L (ref 3.5–5.1)

## 2011-07-08 MED ORDER — POTASSIUM CHLORIDE CRYS ER 20 MEQ PO TBCR
20.0000 meq | EXTENDED_RELEASE_TABLET | Freq: Two times a day (BID) | ORAL | Status: DC
  Filled 2011-07-08 (×2): qty 1

## 2011-07-08 MED ORDER — COUMADIN BOOK
Freq: Once | Status: AC
  Administered 2011-07-08: 11:00:00
  Filled 2011-07-08: qty 1

## 2011-07-08 MED ORDER — POTASSIUM CHLORIDE 10 MEQ/50ML IV SOLN
INTRAVENOUS | Status: AC
  Administered 2011-07-08: 10 meq via INTRAVENOUS
  Filled 2011-07-08: qty 50

## 2011-07-08 MED ORDER — WARFARIN VIDEO
Freq: Once | Status: AC
  Administered 2011-07-08: 11:00:00

## 2011-07-08 MED ORDER — POTASSIUM CHLORIDE 10 MEQ/50ML IV SOLN
10.0000 meq | INTRAVENOUS | Status: AC | PRN
  Administered 2011-07-08 (×3): 10 meq via INTRAVENOUS
  Filled 2011-07-08 (×2): qty 50

## 2011-07-08 MED ORDER — POTASSIUM CHLORIDE 10 MEQ/50ML IV SOLN
10.0000 meq | INTRAVENOUS | Status: AC
  Administered 2011-07-08 (×2): 10 meq via INTRAVENOUS
  Filled 2011-07-08: qty 100

## 2011-07-08 MED ORDER — FUROSEMIDE 80 MG PO TABS
80.0000 mg | ORAL_TABLET | Freq: Two times a day (BID) | ORAL | Status: DC
  Administered 2011-07-08 – 2011-07-13 (×10): 80 mg via ORAL
  Filled 2011-07-08 (×14): qty 1

## 2011-07-08 NOTE — Progress Notes (Signed)
   CARE MANAGEMENT NOTE 07/08/2011  Patient:  Melinda Vang, Melinda Vang   Account Number:  [id number]  Date Initiated:  07/07/2011  Documentation initiated by:  Jameel Quant  Subjective/Objective Assessment:   PT S/P CABG X 4 AND AVR ON 07/04/11.  PTA, PT LIVED ALONE.     Action/Plan:   PHYSICAL THERAPY RECOMMENDING SKILLED NURSING FACILITY PLACEMENT AT DISCHARGE.  WILL CONSULT CSW.   Anticipated DC Date:  07/12/2011   Anticipated DC Plan:  SKILLED NURSING FACILITY         Choice offered to / List presented to:             Status of service:  In process, will continue to follow Medicare Important Message given?   (If response is "NO", the following Medicare IM given date fields will be blank) Date Medicare IM given:   Date Additional Medicare IM given:    Discharge Disposition:    Per UR Regulation:    If discussed at Long Length of Stay Meetings, dates discussed:    Comments:  07/07/11 Earmon Sherrow,RN,BSN CSW CONSULTED TO [place]. Phone [phone]

## 2011-07-08 NOTE — Progress Notes (Signed)
Physical Therapy Treatment Patient Details Name: Melinda Vang MRN: [id number] DOB: 07/20/1945 Today's Date: 07/08/2011  PT Assessment/Plan  PT - Assessment/Plan Comments on Treatment Session: Pt s/p CABG adn AVR progressing well with ambulation but requires max encouragement and reassurance to perform. Pt stating "I'm dying" and "my chest is open" all vital signs stable and pt reassured throughout tx with RN aware of all activity and pt statements. Pt self limiting presumably secondary to anxiety but is progressing. Encouraged continued HEP and ambulation with nursing. Pt unable to recall sternal precautions and educated for all 3. PT Plan: Discharge plan remains appropriate Follow Up Recommendations: Skilled nursing facility PT Goals  Acute Rehab PT Goals Pt will go Sit to Stand: with supervision PT Goal: Sit to Stand - Progress: Updated due to goal met Pt will go Stand to Sit: with supervision PT Goal: Stand to Sit - Progress: Updated due to goals met Pt will Ambulate: >150 feet;with supervision;with least restrictive assistive device PT Goal: Ambulate - Progress: Updated due to goal met Additional Goals PT Goal: Additional Goal #1 - Progress: Progressing toward goal  PT Treatment Precautions/Restrictions  Precautions Precautions: Fall;Sternal Precaution Comments: pt with cognitive delay.   Required Braces or Orthoses: No Restrictions Weight Bearing Restrictions: Yes Mobility (including Balance) Bed Mobility Bed Mobility: No Transfers Transfers: Yes Sit to Stand: 4: Min assist;From chair/3-in-1 Sit to Stand Details (indicate cue type and reason): max cueing to maintain hands on thighs and not push on armrests x 2 trials from chair with assist for anterior translation Stand to Sit: 4: Min assist;To chair/3-in-1 Stand to Sit Details: cueing for hands on thighs and safety backing to surface before sitting Ambulation/Gait Ambulation/Gait: Yes Ambulation/Gait Assistance: 4:  Min assist Ambulation/Gait Assistance Details (indicate cue type and reason): assist for safety, with constant cues to step into RW and attempt to extend trunk as pt maintaining flexed posture with feet posterior to RW. +1 to follow with chair for safety and for reassurance of safety for pt. Pt benefits from max encouragement and reassurance to ambulate Ambulation Distance (Feet): 120 Feet Assistive device: Rolling walker Gait Pattern: Step-through pattern;Trunk flexed;Decreased stride length Gait velocity: slowly not formally assessed 3 standing rests with ambulation Stairs: No  Posture/Postural Control Posture/Postural Control: Postural limitations Postural Limitations: flexed trunk in standing Balance Balance Assessed: No Exercise  General Exercises - Lower Extremity Long Arc Quad: AROM;10 reps;Both;Seated Hip Flexion/Marching: AROM;Both;10 reps;Seated End of Session PT - End of Session Equipment Utilized During Treatment: Gait belt Activity Tolerance: Patient limited by fatigue Patient left: in chair;with call bell in reach Nurse Communication: Mobility status for transfers;Mobility status for ambulation General Behavior During Session: Flat affect Cognition: Impaired, at baseline  Melford Aase 07/08/2011, 11:28 AM Elwyn Reach, [place]

## 2011-07-08 NOTE — Progress Notes (Signed)
Transferred to 2016 via wheelchair.  Portable monitor on.  No changes.

## 2011-07-08 NOTE — Progress Notes (Signed)
   CARDIOTHORACIC SURGERY PROGRESS NOTE   R4 Days Post-Op Procedure(s) (LRB): CORONARY ARTERY BYPASS GRAFTING (CABG) (N/A) AORTIC VALVE REPLACEMENT (AVR) (N/A) MYOMECTOMY (N/A)  Subjective: No specific complaints.  Needs tremendous encouragement.  Objective: Vital signs: BP Readings from Last 1 Encounters:  07/08/11 102/57   Pulse Readings from Last 1 Encounters:  07/08/11 74   Resp Readings from Last 1 Encounters:  07/08/11 19   Temp Readings from Last 1 Encounters:  07/08/11 97.4 F (36.3 C) Axillary    Hemodynamics:    Physical Exam:  Rhythm:   sinus  Breath sounds: Clear but diminished at bases  Heart sounds:  RRR  Incisions:  Clean and dry  Abdomen:  soft  Extremities:  warm   Intake/Output from previous day: 03/25 0701 - 03/26 0700 In: 1817.6 [P.O.:650; I.V.:1065.6; IV Piggyback:102] Out: 1920 [Urine:1920] Intake/Output this shift: Total I/O In: 50 [IV Piggyback:50] Out: -   Lab Results:  Basename 07/08/11 0330 07/07/11 0327  WBC 10.1 11.8*  HGB 10.2* 10.2*  HCT 30.8* 30.7*  PLT 192 177   BMET:  Basename 07/08/11 0330 07/07/11 0327  NA 137 135  K 3.7 4.0  CL 103 103  CO2 24 22  GLUCOSE 99 102*  BUN 27* 22  CREATININE 1.31* 1.41*  CALCIUM 8.4 8.1*    CBG (last 3)   Basename 07/08/11 0728 07/07/11 2217 07/07/11 1834  GLUCAP 80 108* 142*   ABG    Component Value Date/Time   PHART 7.366 07/05/2011 1322   HCO3 20.5 07/05/2011 1322   TCO2 19 07/05/2011 1704   ACIDBASEDEF 4.0* 07/05/2011 1322   O2SAT 99.0 07/05/2011 1322   CXR: n/a  Assessment/Plan: S/P Procedure(s) (LRB): CORONARY ARTERY BYPASS GRAFTING (CABG) (N/A) AORTIC VALVE REPLACEMENT (AVR) (N/A) MYOMECTOMY (N/A)  Overall doing well POD4 Expected post op acute blood loss anemia, mild, stable Expected post op volume excess, mild, diuresing Type II diabetes mellitus, glycemic control excellent Very limited physical mobility and limited cognitive ability Pre and postop PAF,  now in NSR on amiodarone   Mobilize  PT/OT  Transfer step down  D/C amiodarone drip and continue po  Coumadin  Ultimately will need placement in SNF   Kensi Karr H 07/08/2011 8:22 AM

## 2011-07-09 ENCOUNTER — Inpatient Hospital Stay (HOSPITAL_COMMUNITY): Payer: Medicare Other

## 2011-07-09 LAB — GLUCOSE, CAPILLARY
Glucose-Capillary: 120 mg/dL — ABNORMAL HIGH (ref 70–99)
Glucose-Capillary: 111 mg/dL — ABNORMAL HIGH (ref 70–99)

## 2011-07-09 LAB — PROTIME-INR
INR: 1.16 (ref 0.00–1.49)
Prothrombin Time: 15 seconds (ref 11.6–15.2)

## 2011-07-09 LAB — BASIC METABOLIC PANEL
BUN: 28 mg/dL — ABNORMAL HIGH (ref 6–23)
Calcium: 8.5 mg/dL (ref 8.4–10.5)
Chloride: 104 mEq/L (ref 96–112)
Creatinine, Ser: 1.28 mg/dL — ABNORMAL HIGH (ref 0.50–1.10)
GFR calc Af Amer: 49 mL/min — ABNORMAL LOW (ref >90)
GFR calc non Af Amer: 42 mL/min — ABNORMAL LOW (ref >90)

## 2011-07-09 MED ORDER — POTASSIUM CHLORIDE CRYS ER 20 MEQ PO TBCR
30.0000 meq | EXTENDED_RELEASE_TABLET | Freq: Two times a day (BID) | ORAL | Status: DC
  Administered 2011-07-09 – 2011-07-13 (×9): 30 meq via ORAL
  Filled 2011-07-09 (×12): qty 1

## 2011-07-09 NOTE — Plan of Care (Signed)
Problem: Phase III Progression Outcomes Goal: Transfer to PCTU/Telemetry POD Outcome: Completed/Met Date Met:  07/09/11 4 days post op

## 2011-07-09 NOTE — Progress Notes (Addendum)
[address]            [place] [address]          [phone]     5 Days Post-Op  Procedure(s) (LRB): CORONARY ARTERY BYPASS GRAFTING (CABG) (N/A) AORTIC VALVE REPLACEMENT (AVR) (N/A) MYOMECTOMY (N/A) Subjective: Feels "weak"  Objective  Telemetry NSR  Temp:  [95.8 F (35.4 C)-97.9 F (36.6 C)] 95.8 F (35.4 C) (03/27 0557) Pulse Rate:  [75-103] 75  (03/27 0557) Resp:  [17-21] 20  (03/27 0557) BP: (104-157)/(64-112) 126/76 mmHg (03/27 0557) SpO2:  [92 %-98 %] 94 % (03/27 0557) Weight:  [184 lb 1.4 oz (83.5 kg)] 184 lb 1.4 oz (83.5 kg) (03/27 0557)   Intake/Output Summary (Last 24 hours) at 07/09/11 0750 Last data filed at 07/08/11 2100  Gross per 24 hour  Intake    400 ml  Output   1696 ml  Net  -1296 ml       General appearance: distracted, fatigued and no distress Neurologic: grossly intact, but appears over sedated Heart: regular rate and rhythm and soft systolic murmur Lungs: mildly diminished in the bases, no rales/crackles Abdomen: mild distension, + BS, non tender Extremities: acute on chronic edema/ venous stasis changes Wound: incisions healing well  Lab Results:  Basename 07/09/11 0600 07/08/11 0330  NA 137 137  K 3.4* 3.7  CL 104 103  CO2 23 24  GLUCOSE 73 99  BUN 28* 27*  CREATININE 1.28* 1.31*  CALCIUM 8.5 8.4  MG -- --  PHOS -- --   No results found for this basename: AST:2,ALT:2,ALKPHOS:2,BILITOT:2,PROT:2,ALBUMIN:2 in the last 72 hours No results found for this basename: LIPASE:2,AMYLASE:2 in the last 72 hours  Basename 07/08/11 0330 07/07/11 0327  WBC 10.1 11.8*  NEUTROABS -- --  HGB 10.2* 10.2*  HCT 30.8* 30.7*  MCV 91.7 90.6  PLT 192 177   No results found for this basename: CKTOTAL:4,CKMB:4,TROPONINI:4 in the last 72 hours No components found with this basename: POCBNP:3 No results found for this basename: DDIMER in the last 72 hours No results found for this basename: HGBA1C in the last  72 hours No results found for this basename: CHOL,HDL,LDLCALC,TRIG,CHOLHDL in the last 72 hours No results found for this basename: TSH,T4TOTAL,FREET3,T3FREE,THYROIDAB in the last 72 hours No results found for this basename: VITAMINB12,FOLATE,FERRITIN,TIBC,IRON,RETICCTPCT in the last 72 hours  Medications: Scheduled    . acetaminophen  1,000 mg Oral Q6H   Or  . acetaminophen (TYLENOL) oral liquid 160 mg/5 mL  975 mg Per Tube Q6H  . amiodarone  200 mg Oral BID PC  . aspirin EC  81 mg Oral Daily  . atorvastatin  20 mg Oral q1800  . bisacodyl  10 mg Oral Daily   Or  . bisacodyl  10 mg Rectal Daily  . coumadin book   Does not apply Once  . docusate sodium  200 mg Oral Daily  . enoxaparin (LOVENOX) injection  30 mg Subcutaneous Q24H  . furosemide  80 mg Oral BID  . insulin aspart  0-24 Units Subcutaneous TID AC & HS  . insulin glargine  36 Units Subcutaneous BID  . metoprolol tartrate  25 mg Oral BID  . moving right along book   Does not apply Once  . pantoprazole  40 mg Oral Q1200  . potassium chloride  10 mEq Intravenous Q1 Hr x 5  . potassium chloride  20 mEq Oral BID  . sodium chloride  10-40 mL Intracatheter Q12H  . sodium chloride  3 mL Intravenous Q12H  . warfarin  2 mg Oral q1800  . warfarin   Does not apply Once  . Warfarin - Physician Dosing Inpatient   Does not apply q1800  . DISCONTD: furosemide  40 mg Oral BID  . DISCONTD: potassium chloride  20 mEq Oral BID     Radiology/Studies:  Dg Chest 2 View  07/09/2011  *RADIOLOGY REPORT*  Clinical Data: Status post CABG, chest pain, short of breath  CHEST - 2 VIEW  Comparison: 07/07/2011  Findings: Patchy left lower lobe opacity, likely atelectasis, with associated small left pleural effusion.  Prior right pleural effusion has improved / resolved.  No pneumothorax.  The heart is mildly enlarged. Postsurgical changes related to prior CABG.  Stable right arm PICC.  IMPRESSION: Small left pleural effusion with associated left  lower lobe atelectasis.  Right pleural effusion has improved / resolved.  Original Report Authenticated By: Julian Hy, M.D.   Dg Chest Port 1 View  07/07/2011  *RADIOLOGY REPORT*  Clinical Data: Confirm line placement  PORTABLE CHEST - 1 VIEW  Comparison: 07/07/2011  Findings: Right PICC line tip overlies the level of the superior vena cava.  Left IJ sheath remains in place, tip to the level of the left brachycephalic.  The patient has had median sternotomy and CABG.  Heart is enlarged.  There are bilateral pleural effusions. Bibasilar opacities obscuring hemidiaphragms, consistent with atelectasis or infiltrates.  IMPRESSION:  1.  Interval placement of right PICC line, tip to the superior vena cava. 2.  Persistent bibasilar opacities.  Original Report Authenticated By: Glenice Bow, M.D.    INR:1.16 Will add last result for INR, ABG once components are confirmed Will add last 4 CBG results once components are confirmed  Assessment/Plan: S/P Procedure(s) (LRB): CORONARY ARTERY BYPASS GRAFTING (CABG) (N/A) AORTIC VALVE REPLACEMENT (AVR) (N/A) MYOMECTOMY (N/A)  1. Continues with slow progress, rehab -push as able wit physical therapy, limit pain meds as feasible 2. Cont coumadin 3. CBG's well controlled 4. Renal function stable- replace K+, effusions improved 5 IN SR currently 6 cont diuresis, 5 kg over preop wt  LOS: 6 days    GOLD,[name] 3/27/20137:50 AM    I have seen and examined the patient and agree with the assessment and plan as outlined.  Ka Bench H 07/09/2011 2:34 PM

## 2011-07-09 NOTE — Progress Notes (Signed)
Pt refused to ambulated. Educated pt on importance of walking. Pt stated she was tired and would walk again tomorrow. Will reassess pt for willingness to ambulate. Les Pou [facility] 07/09/2011 9:14 PM

## 2011-07-09 NOTE — Progress Notes (Signed)
CARDIAC REHAB PHASE I   PRE:  Rate/Rhythm: 90SR PACs  BP:  Supine:   Sitting: 136/68  Standing:    SaO2: 99%RA  MODE:  Ambulation: 150 ft   POST:  Rate/Rhythem: 104  BP:  Supine:   Sitting: 134/95  Standing:    SaO2: 90-91%RA 1055-1115 Pt reluctant to walk. Stated too dizzy to try.  Able to walk 150 ft on RA with rolling walker and asst x 2. Followed with chair but pt did not need to sit. Tired by end of walk but pt proud of her effort. Stated she was not very dizzy during walk and did better than she thought she would. To recliner with call bell.  Melinda Vang

## 2011-07-09 NOTE — Progress Notes (Signed)
Clinical Social Work Department BRIEF PSYCHOSOCIAL ASSESSMENT 07/09/2011  Patient:  IRIELLE, BERGLIN     Account Number:  [id number]     Admit date:  07/03/2011  Clinical Social Worker:  Katrinka Blazing  Date/Time:  07/09/2011 11:30 AM  Referred by:  Physician  Date Referred:  07/09/2011 Referred for  SNF Placement   Other Referral:   Interview type:  Patient Other interview type:    PSYCHOSOCIAL DATA Living Status:  ALONE Admitted from facility:   Level of care:   Primary support name:  Floyde Parkins [id number] Primary support relationship to patient:   Degree of support available:    CURRENT CONCERNS Current Concerns  Post-Acute Placement   Other Concerns:    SOCIAL WORK ASSESSMENT / PLAN Clinical Social Worker met with pt at bedside.  CSW introduced self and explained role.  CSW confirmed plan for SNF at dc. CSW provided opportunity for pt to process feelings related to SNF.  CSW submitted appropriate informaiton to [facility].  CSW to continue to follow and assist as needed.   Assessment/plan status:  Information/Referral to [employer] Other assessment/ plan:   Information/referral to community resources:    PATIENT'S/FAMILY'S RESPONSE TO PLAN OF CARE: Pt was pleasant and appropriately engaged.      Dala Dock, MSW, [place]

## 2011-07-09 NOTE — Progress Notes (Signed)
Physical Therapy Treatment Patient Details Name: Melinda Vang MRN: [id number] DOB: 06-03-1945 Today's Date: 07/09/2011  PT Assessment/Plan  PT - Assessment/Plan Comments on Treatment Session: Pt able to increase distance today with less encouragement and less anxiety.   PT Plan: Discharge plan remains appropriate PT Frequency: Min 3X/week Recommendations for Other Services: OT consult Follow Up Recommendations: Skilled nursing facility Equipment Recommended: Defer to next venue PT Goals  Acute Rehab PT Goals PT Goal: Sit to Stand - Progress: Progressing toward goal PT Goal: Stand to Sit - Progress: Progressing toward goal PT Goal: Ambulate - Progress: Progressing toward goal  PT Treatment Precautions/Restrictions  Precautions Precautions: Fall;Sternal Precaution Comments: pt with cognitive delay.   Required Braces or Orthoses: No Restrictions Weight Bearing Restrictions: No Mobility (including Balance) Bed Mobility Bed Mobility: No Transfers Transfers: Yes Sit to Stand: 4: Min assist;From chair/3-in-1;Without upper extremity assist Sit to Stand Details (indicate cue type and reason): cues to use UE's on knees. Stand to Sit: 4: Min assist;To chair/3-in-1 Stand to Sit Details: cues to use UE's on knees Ambulation/Gait Ambulation/Gait: Yes Ambulation/Gait Assistance: 4: Min assist Ambulation/Gait Assistance Details (indicate cue type and reason): +1 assist for safety and for pt assurance.  Pt required moderate encouragement.  Cues for safety and to stay close to RW. Ambulation Distance (Feet): 180 Feet Assistive device: Rolling walker Gait Pattern: Step-through pattern;Trunk flexed Gait velocity: decreased Stairs: No Wheelchair Mobility Wheelchair Mobility: No  Posture/Postural Control Posture/Postural Control: Postural limitations Postural Limitations: flexed trunk in standing Balance Balance Assessed: No Static Standing Balance Static Standing - Balance Support:  Bilateral upper extremity supported Static Standing - Level of Assistance: 4: Min assist (guard assist) Static Standing - Comment/# of Minutes: 3 minutes to be cleaned after using BSC Exercise    End of Session PT - End of Session Activity Tolerance: Patient limited by fatigue Patient left: in chair;with call bell in reach Nurse Communication: Mobility status for transfers;Mobility status for ambulation General Behavior During Session: Flat affect (Less anxious today) Cognition: Impaired, at baseline  Narda Bonds 07/09/2011, 1:58 PM  Narda Bonds, PTA Acute Rehab [phone] (office)

## 2011-07-10 ENCOUNTER — Other Ambulatory Visit: Payer: Self-pay

## 2011-07-10 DIAGNOSIS — Z951 Presence of aortocoronary bypass graft: Secondary | ICD-10-CM

## 2011-07-10 DIAGNOSIS — I214 Non-ST elevation (NSTEMI) myocardial infarction: Secondary | ICD-10-CM

## 2011-07-10 LAB — GLUCOSE, CAPILLARY
Glucose-Capillary: 62 mg/dL — ABNORMAL LOW (ref 70–99)
Glucose-Capillary: 98 mg/dL (ref 70–99)
Glucose-Capillary: 143 mg/dL — ABNORMAL HIGH (ref 70–99)

## 2011-07-10 LAB — CBC
MCH: 30.2 pg (ref 26.0–34.0)
Platelets: 285 10*3/uL (ref 150–400)
RBC: 3.41 MIL/uL — ABNORMAL LOW (ref 3.87–5.11)
WBC: 10.4 10*3/uL (ref 4.0–10.5)

## 2011-07-10 LAB — PROTIME-INR: INR: 1.16 (ref 0.00–1.49)

## 2011-07-10 LAB — BASIC METABOLIC PANEL
Calcium: 8.6 mg/dL (ref 8.4–10.5)
GFR calc non Af Amer: 44 mL/min — ABNORMAL LOW (ref >90)
Sodium: 139 mEq/L (ref 135–145)

## 2011-07-10 MED ORDER — METOPROLOL TARTRATE 25 MG PO TABS
25.0000 mg | ORAL_TABLET | Freq: Three times a day (TID) | ORAL | Status: DC
  Administered 2011-07-10 – 2011-07-13 (×11): 25 mg via ORAL
  Filled 2011-07-10 (×15): qty 1

## 2011-07-10 MED ORDER — WARFARIN SODIUM 5 MG PO TABS
5.0000 mg | ORAL_TABLET | Freq: Every day | ORAL | Status: DC
  Filled 2011-07-10: qty 1

## 2011-07-10 MED ORDER — WARFARIN SODIUM 2.5 MG PO TABS
2.5000 mg | ORAL_TABLET | Freq: Every day | ORAL | Status: DC
  Administered 2011-07-10 – 2011-07-15 (×6): 2.5 mg via ORAL
  Filled 2011-07-10 (×7): qty 1

## 2011-07-10 MED ORDER — INSULIN GLARGINE 100 UNIT/ML ~~LOC~~ SOLN
26.0000 [IU] | Freq: Two times a day (BID) | SUBCUTANEOUS | Status: DC
  Administered 2011-07-10: 26 [IU] via SUBCUTANEOUS

## 2011-07-10 MED ORDER — LEVALBUTEROL HCL 0.63 MG/3ML IN NEBU
0.6300 mg | INHALATION_SOLUTION | Freq: Three times a day (TID) | RESPIRATORY_TRACT | Status: DC
  Administered 2011-07-11: 0.63 mg via RESPIRATORY_TRACT
  Filled 2011-07-10 (×9): qty 3

## 2011-07-10 NOTE — Progress Notes (Signed)
CARDIAC REHAB PHASE I   PRE:  Rate/Rhythm: 100 afib  BP:  Supine:   Sitting: 99/60  Standing:    SaO2: 93%RA  MODE:  Ambulation: 150 ft   POST:  Rate/Rhythem: 122 afib  BP:  Supine:   Sitting: 122/64  Standing:    SaO2: 96%RA 1325-1343 Pt walked 144ft on RA with asst x 2 and rolling walker. Did not need to sit but tired by end of walk. Encouraged pt to stay close to walker and stand upright. BP improved with walk. To recliner with call bell. Needed much encouragement to mobilize.  Jeani Sow

## 2011-07-10 NOTE — Progress Notes (Signed)
Inpatient Diabetes Program Recommendations  AACE/ADA: New Consensus Statement on Inpatient Glycemic Control (2009)  Target Ranges:  Prepandial:   less than 140 mg/dL      Peak postprandial:   less than 180 mg/dL (1-2 hours)      Critically ill patients:  140 - 180 mg/dL     Inpatient Diabetes Program Recommendations Insulin - Basal: Decrease Lantus to 26 units BID  Note: Hypoglycemia this AM CBG=62.   Thank you  Raoul Pitch RN,BSN,CDE Inpatient Diabetes Coordinator

## 2011-07-10 NOTE — Progress Notes (Signed)
Pt back in A.Fib. Pt is currently rated controlled 100s-110s. Will continue to monitor pts heart rate. [facility]  07/10/2011 5:48 AM

## 2011-07-10 NOTE — Progress Notes (Signed)
UR Completed.  Melinda Vang [id number] 07/10/2011

## 2011-07-10 NOTE — Progress Notes (Addendum)
Subjective:  Melinda Vang complains of pain and shortness of breath this AM.  She also complains of having too much medication to take and it is making her sick.  Objective:  Vital Signs in the last 24 hours: Temp:  [97.4 F (36.3 C)-97.9 F (36.6 C)] 97.4 F (36.3 C) (03/28 0422) Pulse Rate:  [82-87] 82  (03/28 0422) Resp:  [16-20] 20  (03/28 0422) BP: (123-136)/(63-77) 123/77 mmHg (03/28 0422) SpO2:  [93 %-98 %] 97 % (03/28 0422) Weight:  [180 lb 9.6 oz (81.92 kg)] 180 lb 9.6 oz (81.92 kg) (03/28 0107)  Intake/Output from previous day: 03/27 0701 - 03/28 0700 In: 1080 [P.O.:1080] Out: 2853 [Urine:2850; Stool:3] Intake/Output from this shift:    Physical Exam: General appearance: alert and mild distress Lungs: wheezes bilaterally Heart: irregularly irregular rhythm Abdomen: soft, non-tender; bowel sounds normal; no masses,  no organomegaly Extremities: edema 2+ and venous stasis dermatitis noted Skin: incisions healing, no evidence of infection  Lab Results:  Basename 07/10/11 0450 07/08/11 0330  WBC 10.4 10.1  HGB 10.3* 10.2*  PLT 285 192    Basename 07/10/11 0450 07/09/11 0600  NA 139 137  K 4.1 3.4*  CL 103 104  CO2 27 23  GLUCOSE 60* 73  BUN 29* 28*  CREATININE 1.24* 1.28*   No results found for this basename: TROPONINI:2,CK,MB:2 in the last 72 hours Hepatic Function Panel No results found for this basename: PROT,ALBUMIN,AST,ALT,ALKPHOS,BILITOT,BILIDIR,IBILI in the last 72 hours No results found for this basename: CHOL in the last 72 hours No results found for this basename: PROTIME in the last 72 hours  Assessment/Plan:   1. S/P CABG/AVR/Myomectomy 2. CV- patient remains in rate controlled A.Fib this morning, currently on 25mg  Lopressor, 200mg  Amiodarone BID, she may benefit from increase in Amiodarone, EPW in place and will keep today 3. Resp- patient is wheezing throughout all lung fields, encourage IS, will start nebulizer treatments 4. Fluid  Overload- will continue Lasix, patients weight up 3kg from preop 5. INR 1.16- will give 2.5mg  tonight   LOS: 7 days    Melinda Vang, Melinda Vang 07/10/2011, 8:26 AM    I have seen and examined the patient and agree with the assessment and plan as outlined.  Back in Afib today with controlled rate.  Overall looks pretty good although progress expectedly slow.  Ultimately I would hope that she will be ready for d/c to SNF early next week.   Melinda Vang 07/10/2011 5:25 PM

## 2011-07-10 NOTE — Progress Notes (Signed)
Patient Name: Melinda Vang Date of Encounter: 07/10/2011     Principal Problem:  *S/P CABG x 4 Active Problems:  NSTEMI (non-ST elevated myocardial infarction)  DM (diabetes mellitus)  Dyslipidemia  Atrial fibrillation  Anemia  S/P AVR (aortic valve replacement)    SUBJECTIVE: C/o cough, chest soreness and dizziness upon standing. Reports improvement in sob. Denies palpitations, n/v.  OBJECTIVE  Filed Vitals:   07/09/11 1348 07/09/11 2100 07/10/11 0107 07/10/11 0422  BP: 127/63 136/73  123/77  Pulse: 87 84  82  Temp: 97.9 F (36.6 C) 97.6 F (36.4 C)  97.4 F (36.3 C)  TempSrc: Oral Oral  Axillary  Resp: 20 16  20   Height:      Weight:   81.92 kg (180 lb 9.6 oz)   SpO2: 98% 93%  97%    Intake/Output Summary (Last 24 hours) at 07/10/11 0753 Last data filed at 07/10/11 0429  Gross per 24 hour  Intake    720 ml  Output   2853 ml  Net  -2133 ml   Weight change: -1.581 kg (-3 lb 7.8 oz)  PHYSICAL EXAM  General: Appears older than stated age, well nourished, in NAD Head: Normocephalic, atraumatic, sclera non-icteric, no xanthomas Neck: Supple without bruits, JVD difficult to appreciate given body habitus Lungs:  Increased respiratory effort with minimal exertion. Bibasilar rales and expiratory wheezing appreciated. No rhonchi. Heart: Irregularly irregular, tachycardic, no s3, s4, or murmurs, rubs, heaves, gallops or thrills Abdomen: Soft, non-tender, non-distended, BS + x 4.  Msk:  Strength and tone appears decreased for age. Extremities: Bilateral erythematous patches on LEs, indurated, nontender, nonedematous. Trace edema noted. No clubbing or cyanosis. DP/PT/Radials 2+ and equal bilaterally. Neuro: Alert and oriented X 3. Moves all extremities spontaneously. Psych: Normal affect.  LABS:  Recent Labs  [facility] 07/10/11 0450 07/08/11 0330   WBC 10.4 10.1   HGB 10.3* 10.2*   HCT 32.2* 30.8*   MCV 94.4 91.7   PLT 285 192   Lab 07/10/11 0450 07/09/11  0600 07/08/11 0330 07/03/11 1250  NA 139 137 137 --  K 4.1 3.4* 3.7 --  CL 103 104 103 --  CO2 27 23 24  --  BUN 29* 28* 27* --  CREATININE 1.24* 1.28* 1.31* --  CALCIUM 8.6 8.5 8.4 --  PROT -- -- -- 5.5*  BILITOT -- -- -- 0.3  ALKPHOS -- -- -- 64  ALT -- -- -- 14  AST -- -- -- 28  AMYLASE -- -- -- --  LIPASE -- -- -- --  GLUCOSE 60* 73 99 --    TELE: NSR-->frequent PACs at 0400--> atrial fibrillation with rapid rates, 100-120 bpm at 0500  ECG: atrial fibrillation with RVR, 111 bpm, LAD, Q waves V1-V3 unchanged from 07/05/11 tracing   Radiology/Studies:  Dg Chest 2 View  07/09/2011  *RADIOLOGY REPORT*  Clinical Data: Status post CABG, chest pain, short of breath  CHEST - 2 VIEW  Comparison: 07/07/2011  Findings: Patchy left lower lobe opacity, likely atelectasis, with associated small left pleural effusion.  Prior right pleural effusion has improved / resolved.  No pneumothorax.  The heart is mildly enlarged. Postsurgical changes related to prior CABG.  Stable right arm PICC.  IMPRESSION: Small left pleural effusion with associated left lower lobe atelectasis.  Right pleural effusion has improved / resolved.  Original Report Authenticated By: Julian Hy, M.D.   Dg Chest Port 1 View  07/07/2011  *RADIOLOGY REPORT*  Clinical Data: Confirm line placement  PORTABLE CHEST - 1 VIEW  Comparison: 07/07/2011  Findings: Right PICC line tip overlies the level of the superior vena cava.  Left IJ sheath remains in place, tip to the level of the left brachycephalic.  The patient has had median sternotomy and CABG.  Heart is enlarged.  There are bilateral pleural effusions. Bibasilar opacities obscuring hemidiaphragms, consistent with atelectasis or infiltrates.  IMPRESSION:  1.  Interval placement of right PICC line, tip to the superior vena cava. 2.  Persistent bibasilar opacities.  Original Report Authenticated By: Glenice Bow, M.D.   Dg Chest Port 1 View  07/07/2011  *RADIOLOGY  REPORT*  Clinical Data: CABG and aortic valve replacement.  PORTABLE CHEST - 1 VIEW  Comparison: 07/06/2011  Findings: Bilateral chest drains have been removed.  No evidence for a large pneumothorax.  Left jugular central line is still in place.  Bibasilar densities suggest pleural fluid and atelectasis. Heart and mediastinum are enlarged and stable.  Median sternotomy and postoperative changes are noted.  IMPRESSION: Removal of chest drains without a pneumothorax.  Bibasilar densities suggestive for pleural fluid and atelectasis.  Original Report Authenticated By: Markus Daft, M.D.   Dg Chest Portable 1 View In Am  07/06/2011  *RADIOLOGY REPORT*  Clinical Data: Postop cardiac surgery  PORTABLE CHEST - 1 VIEW  Comparison: 07/05/2011; 07/02/2011  Findings: Unchanged and enlarged cardiac silhouette and mediastinal contours post median sternotomy, CABG and valve replacement. Interval extubation and removal of enteric tube.  Unchanged positioning of bilateral chest tubes and mediastinal drains. Interval removal of PA catheter with a vascular sheath tip remaining over the central aspect of the left innominate vein. Lung volumes remain reduced with perihilar and basilar opacities favored to represent atelectasis.  Unchanged small bilateral effusions.  Pulmonary vasculature is indistinct.  Grossly unchanged bones.  IMPRESSION: 1.  Interval extubation and removal of enteric tube and PA catheter.  Remaining support apparatus as above.  No pneumothorax. 2.  Persistently reduced lung volumes with bilateral perihilar and basilar opacities favored to represent atelectasis. 3.  Grossly unchanged findings of mild pulmonary edema with small bilateral effusions.  Original Report Authenticated By: Rachel Moulds, M.D.   Dg Chest Portable 1 View In Am  07/05/2011  *RADIOLOGY REPORT*  Clinical Data: Postop for CABG.  PORTABLE CHEST - 1 VIEW  Comparison: Earlier today at 0038 hours  Findings: 0743 hours.  Endotracheal tube is  unchanged in position. Prior median sternotomy.  Nasogastric tube extends beyond the inferior aspect of the film.  Mediastinal drain and bilateral chest tubes are unchanged.  Left IJ Swan-Ganz catheter has tip at pulmonary outflow tract. Cardiomegaly accentuated by AP portable technique.  Small left pleural effusion, likely increased. No pneumothorax.  Low lung volumes with mild pulmonary venous congestion. Slight increase in patchy left base air space disease.  IMPRESSION:  1.  Stable appearance of support apparatus, without pneumothorax. 2.  Slight worsening in left base aeration, with increased atelectasis and pleural fluid.  Original Report Authenticated By: Areta Haber, M.D.   Dg Chest Portable 1 View  07/05/2011  *RADIOLOGY REPORT*  Clinical Data: Coronary artery disease.  Postop from coronary bypass grafting.  PORTABLE CHEST - 1 VIEW  Comparison: 07/02/2011  Findings: Interval CABG.  Swan-Ganz catheter tip is in the main pulmonary artery.  Endotracheal tube, nasogastric tube, mediastinal drain, and bilateral chest tubes are seen in appropriate position. No evidence of pneumothorax.  Cardiomegaly stable.  Atelectasis is seen in the left perihilar region and both  lung bases.  IMPRESSION:  1.  Postoperative chest.  No evidence of pneumothorax. 2.  Left perihilar and bibasilar atelectasis. 3.  Stable cardiomegaly.  Original Report Authenticated By: Marlaine Hind, M.D.    Current Medications:     . acetaminophen  1,000 mg Oral Q6H   Or  . acetaminophen (TYLENOL) oral liquid 160 mg/5 mL  975 mg Per Tube Q6H  . amiodarone  200 mg Oral BID PC  . aspirin EC  81 mg Oral Daily  . atorvastatin  20 mg Oral q1800  . bisacodyl  10 mg Oral Daily   Or  . bisacodyl  10 mg Rectal Daily  . docusate sodium  200 mg Oral Daily  . enoxaparin (LOVENOX) injection  30 mg Subcutaneous Q24H  . furosemide  80 mg Oral BID  . insulin aspart  0-24 Units Subcutaneous TID AC & HS  . insulin glargine  36 Units  Subcutaneous BID  . metoprolol tartrate  25 mg Oral BID  . pantoprazole  40 mg Oral Q1200  . potassium chloride  30 mEq Oral BID  . sodium chloride  10-40 mL Intracatheter Q12H  . sodium chloride  3 mL Intravenous Q12H  . warfarin  2 mg Oral q1800  . Warfarin - Physician Dosing Inpatient   Does not apply q1800  . DISCONTD: potassium chloride  20 mEq Oral BID    ASSESSMENT AND PLAN:  1. Paroxysmal atrial fibrillation- with rapid rates; in the setting of post-op status (POD 6) for emergent CABG x 4, pericardial tissue AVR and septal myomectomy. Patient back in atrial fibrillation this AM. Rates fairly controlled. She denies prior history of a fib. Patient asymptomatic with this. She does have dyspnea on minimal exertion, but am unsure how this compares to immediate post-op dyspnea. It has been noted that she has made slow progress, and this may likely be due to fluid overload over a fib. She is on Amiodarone, Lopressor and Coumadin. INR subtherapeutic today at 1.16. On Lovenox for DVT prophylaxis. She was switched from IV to PO Amio two days ago.  - Continue Amiodarone   - Will give Lopressor now, increase to TID  - Continue Coumadin/Lovenox while awaiting therapeutic INR   2. Hypervolemia- secondary to post-op status. Diuresing well at net I/O: ~ - 2.8 L. Patient still dyspneic with minimal exertion. Trace bibasilar rales, exp wheezing noted. Renal function stable. VSS. It was noted yesterday that she was still 5 kg over preop weight. Still has some room to go.             - Continue diuresis            -Follow renal function  3. Lower extremity erythematous lesions- patient states these are chronic and described them as cellulitis. She reports that sometimes they will become "swollen and tender." Indurated, nonedematous, nontender on exam today. Afebrile without leukocytosis.    4. Acute blood loss anemia- post-op; slowly improving  5. CAD- stable; no acute ischemic events since surgery  -  Continue cardiac meds- ASA/BB/statin  6. Hypertension- well-controlled today  - Continue antihypertensives  7. Hyperlipidemia- LDL 101 on 03/22; goal < 70  - Continue statin    Signed, R. Valeria Batman, PA-C 07/10/2011, 7:53 AM  I have personally seen and examined this patient with Valeria Batman, PA-C. I agree with the assessment and plan as outlined above. She is doing well with slow progressive recovery. Will need amiodarone, beta blocker and coumadin for now with post-op  a fib.

## 2011-07-10 NOTE — Progress Notes (Signed)
Patient refused ambulation when offered due to not feeling well. Educated patient on importance of trying to ambulate, will attempt walking later. Melinda Vang

## 2011-07-11 LAB — GLUCOSE, CAPILLARY
Glucose-Capillary: 86 mg/dL (ref 70–99)
Glucose-Capillary: 104 mg/dL — ABNORMAL HIGH (ref 70–99)

## 2011-07-11 LAB — BASIC METABOLIC PANEL
CO2: 29 mEq/L (ref 19–32)
Calcium: 8.7 mg/dL (ref 8.4–10.5)
GFR calc Af Amer: 54 mL/min — ABNORMAL LOW (ref >90)
GFR calc non Af Amer: 47 mL/min — ABNORMAL LOW (ref >90)
Sodium: 141 mEq/L (ref 135–145)

## 2011-07-11 LAB — PROTIME-INR: INR: 1.1 (ref 0.00–1.49)

## 2011-07-11 MED ORDER — LEVALBUTEROL HCL 0.63 MG/3ML IN NEBU
0.6300 mg | INHALATION_SOLUTION | Freq: Two times a day (BID) | RESPIRATORY_TRACT | Status: DC
  Administered 2011-07-11 – 2011-07-16 (×7): 0.63 mg via RESPIRATORY_TRACT
  Filled 2011-07-11 (×11): qty 3

## 2011-07-11 MED ORDER — METFORMIN HCL 500 MG PO TABS
500.0000 mg | ORAL_TABLET | Freq: Two times a day (BID) | ORAL | Status: DC
  Administered 2011-07-11 – 2011-07-16 (×9): 500 mg via ORAL
  Filled 2011-07-11 (×13): qty 1

## 2011-07-11 NOTE — Progress Notes (Signed)
Patient O2 sat was 89 at 0900. 2L O2 [place] was started, O2 sats re-checked in hour. O2 sats 98% at 1000, so O2 lowered to 1 L. Sats re-checked at 11:15, pt still at 99%. O2 removed and will re-check in an hour.   Delight Hoh, Student RN, [employer] SON

## 2011-07-11 NOTE — Progress Notes (Deleted)
Clinical Social Work Department BRIEF PSYCHOSOCIAL ASSESSMENT 07/11/2011  Patient:  Melinda Vang     Account Number:  [id number]     Admit date:  07/09/2011  Clinical Social Worker:  Cephus Slater  Date/Time:  07/11/2011 09:00 AM  Referred by:  Care Management  Date Referred:  07/10/2011 Referred for  SNF Placement   Other Referral:   Interview type:  Family Other interview type:    PSYCHOSOCIAL DATA Living Status:  FACILITY Admitted from facility:  [facility] Level of care:  [place] Primary support name:   Primary support relationship to patient:  SIBLING Degree of support available:    CURRENT CONCERNS  Other Concerns:    SOCIAL WORK ASSESSMENT / PLAN CSW unable to speak with pt, as she is currently receiving IV Ativan and is very drowsy. CSW spoke with RN who states pt admitted from [facility]. CSW spoke with pt sister who confirms this and states plan is for pt to return to [facility] at d/c, if bed available. Pt does not have bed hold presently.   Assessment/plan status:  Other - See comment Other assessment/ plan:   SNF placement at d/c   Information/referral to community resources:    PATIENT'S/FAMILY'S RESPONSE TO PLAN OF CARE: Pt sister appreciative of CSW intervention.

## 2011-07-11 NOTE — Progress Notes (Signed)
Pt has been faxed out to facilities in [place] area. CSW awaiting bed offers.  Jerral Ralph, MSW [phone]

## 2011-07-11 NOTE — Progress Notes (Signed)
CARDIAC REHAB PHASE I   PRE:  Rate/Rhythm: 88 SR  BP:  Supine:   Sitting: 111/51  Standing:    SaO2: 95-97 RA  MODE:  Ambulation: 192 ft   POST:  Rate/Rhythem: 95 SR  BP:  Supine:   Sitting: 132/64  Standing:    SaO2: 94 RA 1105-1154  Assisted X 2 and used walker to ambulate. Pt anxious about walking, wants two people beside her. Used BSC before walk. Pt leans forward with walking and needs constant reminders about upright posture. She tires easily and c/o that standing straight hurt her in chest. Able to get her 190 feet. Back to recliner after walk with call light in reach. Encouraged her to keep feet elevated, but she does not want to.Ysidro Evert, Valinda Hoar

## 2011-07-11 NOTE — Progress Notes (Signed)
Physical Therapy Treatment Patient Details Name: Melinda Vang MRN: [id number] DOB: 10-Aug-1945 Today's Date: 07/11/2011  PT Assessment/Plan   PT Goals     PT Treatment Precautions/Restrictions  Precautions Precautions: Fall;Sternal Precaution Comments: pt with cognitive delay.   Required Braces or Orthoses: No Restrictions Weight Bearing Restrictions: No Mobility (including Balance) Bed Mobility Bed Mobility: No (pt declined practice.) Transfers Transfers: Yes Sit to Stand: 4: Min assist Stand to Sit: 4: Min assist Ambulation/Gait Ambulation/Gait: Yes Ambulation/Gait Assistance: 4: Min assist Ambulation Distance (Feet): 110 Feet Assistive device: Rolling walker Gait Pattern: Trunk flexed (pt refused to follow postural cues.) Gait velocity: decreased Stairs: No Wheelchair Mobility Wheelchair Mobility: No    Exercise    End of Session PT - End of Session Equipment Utilized During Treatment: Gait belt Activity Tolerance: Patient tolerated treatment well Patient left: in chair General Behavior During Session: Flat affect Cognition: Impaired, at baseline  Erle Crocker 07/11/2011, 4:58 PM

## 2011-07-11 NOTE — Progress Notes (Addendum)
7 Days Post-Op Procedure(s) (LRB): CORONARY ARTERY BYPASS GRAFTING (CABG) (N/A) AORTIC VALVE REPLACEMENT (AVR) (N/A) MYOMECTOMY (N/A)  Subjective: Patient ate breakfast and had a bowel movement.  Objective: Vital signs in last 24 hours: Patient Vitals for the past 24 hrs:  BP Temp Temp src Pulse Resp SpO2 Weight  07/11/11 0759 - - - - - 89 % -  07/11/11 0406 94/58 mmHg 97.8 F (36.6 C) Axillary 77  20  95 % 173 lb 15.1 oz (78.9 kg)  07/10/11 1955 106/48 mmHg 98.2 F (36.8 C) Axillary 100  20  98 % -  07/10/11 1427 122/64 mmHg 97 F (36.1 C) Oral 105  20  100 % -   Pre op weight  78.9 kg Current Weight  07/11/11 173 lb 15.1 oz (78.9 kg)      Intake/Output from previous day: 03/28 0701 - 03/29 0700 In: 1083 [P.O.:1080; I.V.:3] Out: 1150 [Urine:1150]   Physical Exam:  Cardiovascular: RRR, no murmurs, gallops, or rubs. Pulmonary: Diminished at the bases; no rales, wheezes, or rhonchi. Abdomen: Soft, non tender, bowel sounds present. Extremities: Mild bilateral lower extremity edema.Venous statis changes. Wounds: Some serosanguinous oozing from left thigh wound.  Lab Results: CBC: Basename 07/10/11 0450  WBC 10.4  HGB 10.3*  HCT 32.2*  PLT 285   BMET:  Basename 07/11/11 0517 07/10/11 0450  NA 141 139  K 4.1 4.1  CL 105 103  CO2 29 27  GLUCOSE 67* 60*  BUN 27* 29*  CREATININE 1.18* 1.24*  CALCIUM 8.7 8.6    PT/INR:  Basename 07/11/11 0517  LABPROT 14.4  INR 1.10   ABG:  INR: Will add last result for INR, ABG once components are confirmed Will add last 4 CBG results once components are confirmed  Assessment/Plan:  1. CV - Previous afib with CVR.Converted to SR earlier this am. Continue Amiodarone 200 bid, Lopressor 25 tid,Coumadin. 2.  Pulmonary - Encourage incentive spirometer. 3. Volume Overload - Continue with diuresis. 4.  Acute blood loss anemia - Last H/H 10.3/32.2. 5.Creatinine decreased from 1.24 to 1.18. 6.DM-CBGs 143/66/86.On Lantus 26  bid. Was taking Metformin and Glipizide pre op. Will restart Metformin and monitor creatinine. Will stop scheduled insulin. 7.Remove EPW in am.   ZIMMERMAN,DONIELLE MPA-C 07/11/2011    Chart reviewed, patient examined, agree with above.

## 2011-07-12 LAB — BASIC METABOLIC PANEL
BUN: 30 mg/dL — ABNORMAL HIGH (ref 6–23)
CO2: 29 mEq/L (ref 19–32)
Calcium: 9.4 mg/dL (ref 8.4–10.5)
Creatinine, Ser: 1.26 mg/dL — ABNORMAL HIGH (ref 0.50–1.10)
Glucose, Bld: 137 mg/dL — ABNORMAL HIGH (ref 70–99)

## 2011-07-12 LAB — GLUCOSE, CAPILLARY
Glucose-Capillary: 155 mg/dL — ABNORMAL HIGH (ref 70–99)
Glucose-Capillary: 150 mg/dL — ABNORMAL HIGH (ref 70–99)
Glucose-Capillary: 89 mg/dL (ref 70–99)

## 2011-07-12 LAB — PROTIME-INR: INR: 1.12 (ref 0.00–1.49)

## 2011-07-12 MED ORDER — ALTEPLASE 2 MG IJ SOLR
2.0000 mg | Freq: Once | INTRAMUSCULAR | Status: AC
  Administered 2011-07-12: 2 mg
  Filled 2011-07-12: qty 2

## 2011-07-12 MED ORDER — MENTHOL 3 MG MT LOZG
1.0000 | LOZENGE | OROMUCOSAL | Status: DC | PRN
  Filled 2011-07-12 (×3): qty 9

## 2011-07-12 NOTE — Progress Notes (Signed)
Patient ID: Melinda Vang, female   DOB: 22-Dec-1945, 66 y.o.   MRN: [id number]   SUBJECTIVE:  Rhythm is stable today   Filed Vitals:   07/11/11 1652 07/11/11 1936 07/11/11 1953 07/12/11 0409  BP:  109/57  105/57  Pulse: 88 90  86  Temp:  98.2 F (36.8 C)  97.6 F (36.4 C)  TempSrc:  Axillary  Axillary  Resp:  20  18  Height:      Weight:    170 lb 10.2 oz (77.4 kg)  SpO2:  100% 94% 96%    Intake/Output Summary (Last 24 hours) at 07/12/11 [id number] Last data filed at 07/12/11 0000  Gross per 24 hour  Intake    720 ml  Output   2050 ml  Net  -1330 ml    LABS: Basic Metabolic Panel:  Basename 07/11/11 0517 07/10/11 0450  NA 141 139  K 4.1 4.1  CL 105 103  CO2 29 27  GLUCOSE 67* 60*  BUN 27* 29*  CREATININE 1.18* 1.24*  CALCIUM 8.7 8.6  MG -- --  PHOS -- --   Liver Function Tests: No results found for this basename: AST:2,ALT:2,ALKPHOS:2,BILITOT:2,PROT:2,ALBUMIN:2 in the last 72 hours No results found for this basename: LIPASE:2,AMYLASE:2 in the last 72 hours CBC:  Basename 07/10/11 0450  WBC 10.4  NEUTROABS --  HGB 10.3*  HCT 32.2*  MCV 94.4  PLT 285   Cardiac Enzymes: No results found for this basename: CKTOTAL:3,CKMB:3,CKMBINDEX:3,TROPONINI:3 in the last 72 hours BNP: No components found with this basename: POCBNP:3 D-Dimer: No results found for this basename: DDIMER:2 in the last 72 hours Hemoglobin A1C: No results found for this basename: HGBA1C in the last 72 hours Fasting Lipid Panel: No results found for this basename: CHOL,HDL,LDLCALC,TRIG,CHOLHDL,LDLDIRECT in the last 72 hours Thyroid Function Tests: No results found for this basename: TSH,T4TOTAL,FREET3,T3FREE,THYROIDAB in the last 72 hours  RADIOLOGY: Dg Chest 2 View  07/09/2011  *RADIOLOGY REPORT*  Clinical Data: Status post CABG, chest pain, short of breath  CHEST - 2 VIEW  Comparison: 07/07/2011  Findings: Patchy left lower lobe opacity, likely atelectasis, with associated small left  pleural effusion.  Prior right pleural effusion has improved / resolved.  No pneumothorax.  The heart is mildly enlarged. Postsurgical changes related to prior CABG.  Stable right arm PICC.  IMPRESSION: Small left pleural effusion with associated left lower lobe atelectasis.  Right pleural effusion has improved / resolved.  Original Report Authenticated By: Julian Hy, M.D.   Dg Chest Port 1 View  07/07/2011  *RADIOLOGY REPORT*  Clinical Data: Confirm line placement  PORTABLE CHEST - 1 VIEW  Comparison: 07/07/2011  Findings: Right PICC line tip overlies the level of the superior vena cava.  Left IJ sheath remains in place, tip to the level of the left brachycephalic.  The patient has had median sternotomy and CABG.  Heart is enlarged.  There are bilateral pleural effusions. Bibasilar opacities obscuring hemidiaphragms, consistent with atelectasis or infiltrates.  IMPRESSION:  1.  Interval placement of right PICC line, tip to the superior vena cava. 2.  Persistent bibasilar opacities.  Original Report Authenticated By: Glenice Bow, M.D.   Dg Chest Port 1 View  07/07/2011  *RADIOLOGY REPORT*  Clinical Data: CABG and aortic valve replacement.  PORTABLE CHEST - 1 VIEW  Comparison: 07/06/2011  Findings: Bilateral chest drains have been removed.  No evidence for a large pneumothorax.  Left jugular central line is still in place.  Bibasilar densities suggest pleural fluid  and atelectasis. Heart and mediastinum are enlarged and stable.  Median sternotomy and postoperative changes are noted.  IMPRESSION: Removal of chest drains without a pneumothorax.  Bibasilar densities suggestive for pleural fluid and atelectasis.  Original Report Authenticated By: Markus Daft, M.D.   Dg Chest Portable 1 View In Am  07/06/2011  *RADIOLOGY REPORT*  Clinical Data: Postop cardiac surgery  PORTABLE CHEST - 1 VIEW  Comparison: 07/05/2011; 07/02/2011  Findings: Unchanged and enlarged cardiac silhouette and mediastinal  contours post median sternotomy, CABG and valve replacement. Interval extubation and removal of enteric tube.  Unchanged positioning of bilateral chest tubes and mediastinal drains. Interval removal of PA catheter with a vascular sheath tip remaining over the central aspect of the left innominate vein. Lung volumes remain reduced with perihilar and basilar opacities favored to represent atelectasis.  Unchanged small bilateral effusions.  Pulmonary vasculature is indistinct.  Grossly unchanged bones.  IMPRESSION: 1.  Interval extubation and removal of enteric tube and PA catheter.  Remaining support apparatus as above.  No pneumothorax. 2.  Persistently reduced lung volumes with bilateral perihilar and basilar opacities favored to represent atelectasis. 3.  Grossly unchanged findings of mild pulmonary edema with small bilateral effusions.  Original Report Authenticated By: Rachel Moulds, M.D.   Dg Chest Portable 1 View In Am  07/05/2011  *RADIOLOGY REPORT*  Clinical Data: Postop for CABG.  PORTABLE CHEST - 1 VIEW  Comparison: Earlier today at 0038 hours  Findings: 0743 hours.  Endotracheal tube is unchanged in position. Prior median sternotomy.  Nasogastric tube extends beyond the inferior aspect of the film.  Mediastinal drain and bilateral chest tubes are unchanged.  Left IJ Swan-Ganz catheter has tip at pulmonary outflow tract. Cardiomegaly accentuated by AP portable technique.  Small left pleural effusion, likely increased. No pneumothorax.  Low lung volumes with mild pulmonary venous congestion. Slight increase in patchy left base air space disease.  IMPRESSION:  1.  Stable appearance of support apparatus, without pneumothorax. 2.  Slight worsening in left base aeration, with increased atelectasis and pleural fluid.  Original Report Authenticated By: Areta Haber, M.D.   Dg Chest Portable 1 View  07/05/2011  *RADIOLOGY REPORT*  Clinical Data: Coronary artery disease.  Postop from coronary bypass  grafting.  PORTABLE CHEST - 1 VIEW  Comparison: 07/02/2011  Findings: Interval CABG.  Swan-Ganz catheter tip is in the main pulmonary artery.  Endotracheal tube, nasogastric tube, mediastinal drain, and bilateral chest tubes are seen in appropriate position. No evidence of pneumothorax.  Cardiomegaly stable.  Atelectasis is seen in the left perihilar region and both lung bases.  IMPRESSION:  1.  Postoperative chest.  No evidence of pneumothorax. 2.  Left perihilar and bibasilar atelectasis. 3.  Stable cardiomegaly.  Original Report Authenticated By: Marlaine Hind, M.D.    PHYSICAL EXAM    TELEMETRY: I have personally reviewed telemetry. There is normal sinus rhythm.   ASSESSMENT AND PLAN:    *S/P CABG x 4       Patient continues to recover post CABG. She was sent from the emergency room at [facility]. The surgery was done urgently. She also received an aortic valve replacement and a myomectomy.   Atrial fibrillation    Patient had postoperative atrial fibrillation. She was treated with amiodarone and converted to sinus rhythm. Amiodarone is being continued orally at this time.   S/P AVR (aortic valve replacement)   Dola Argyle 07/12/2011 8:22 AM

## 2011-07-12 NOTE — Progress Notes (Signed)
EPW's D/c per MD order and Hospital protocol.All ends intact. Pt reminded to stay in bed for 1 hr. Will continue to monitor closely.

## 2011-07-12 NOTE — Progress Notes (Signed)
CARDIAC REHAB PHASE I   PRE:  Rate/Rhythm: 92  BP:  Supine:   Sitting: 100/56  Standing:    SaO2: 93  RA  MODE:  Ambulation: 150 ft   POST:  Rate/Rhythem: 87 SR  BP:  Supine:   Sitting: 104/60  Standing:    SaO2: 93 ra  12:30 to 13:00 Patient ambulated with assist x 2 using rolling walker.  Patient has difficulty standing straight.  Short steps.  O2 sats did drop ?81 during walk .  Unsure if Pulse Ox is reading correctly.  Encouraged patient to do PLB.  Very short breaths, wants to hurry to get walk over.  On return to room , she was placed in bedside chair, chair alarm in place and call bell within reach.   Encouraged to use ICS and flutter valve.  Dry ineffective cough.    Melinda Humbles RN

## 2011-07-12 NOTE — Progress Notes (Addendum)
[address]            [place] [address]          [phone]     8 Days Post-Op  Procedure(s) (LRB): CORONARY ARTERY BYPASS GRAFTING (CABG) (N/A) AORTIC VALVE REPLACEMENT (AVR) (N/A) MYOMECTOMY (N/A) Subjective:  C/O cough and reflux  Objective  Telemetry SR  Temp:  [97.6 F (36.4 C)-98.2 F (36.8 C)] 97.6 F (36.4 C) (03/30 0409) Pulse Rate:  [84-90] 89  (03/30 0947) Resp:  [18-20] 18  (03/30 0409) BP: (105-109)/(57-64) 108/62 mmHg (03/30 0947) SpO2:  [94 %-100 %] 96 % (03/30 0409) Weight:  [170 lb 10.2 oz (77.4 kg)] 170 lb 10.2 oz (77.4 kg) (03/30 0409)   Intake/Output Summary (Last 24 hours) at 07/12/11 1342 Last data filed at 07/12/11 0807  Gross per 24 hour  Intake    480 ml  Output   2550 ml  Net  -2070 ml   Physical Examination:General- alert, NAD Chest - diminished in bases Heart - normal rate and regular rhythm, soft systolic murmur Abdomen - soft, nontender, nondistended, no masses or organomegaly Extremities - + BLE edema Skin - incisions healing well   Lab Results:  Basename 07/12/11 0800 07/11/11 0517  NA 139 141  K 4.2 4.1  CL 97 105  CO2 29 29  GLUCOSE 137* 67*  BUN 30* 27*  CREATININE 1.26* 1.18*  CALCIUM 9.4 8.7  MG -- --  PHOS -- --   No results found for this basename: AST:2,ALT:2,ALKPHOS:2,BILITOT:2,PROT:2,ALBUMIN:2 in the last 72 hours No results found for this basename: LIPASE:2,AMYLASE:2 in the last 72 hours  Basename 07/10/11 0450  WBC 10.4  NEUTROABS --  HGB 10.3*  HCT 32.2*  MCV 94.4  PLT 285   No results found for this basename: CKTOTAL:4,CKMB:4,TROPONINI:4 in the last 72 hours No components found with this basename: POCBNP:3 No results found for this basename: DDIMER in the last 72 hours No results found for this basename: HGBA1C in the last 72 hours No results found for this basename: CHOL,HDL,LDLCALC,TRIG,CHOLHDL in the last 72 hours No results found for this basename:  TSH,T4TOTAL,FREET3,T3FREE,THYROIDAB in the last 72 hours No results found for this basename: VITAMINB12,FOLATE,FERRITIN,TIBC,IRON,RETICCTPCT in the last 72 hours  Medications: Scheduled    . alteplase  2 mg Intracatheter Once  . alteplase  2 mg Intracatheter Once  . amiodarone  200 mg Oral BID PC  . aspirin EC  81 mg Oral Daily  . atorvastatin  20 mg Oral q1800  . bisacodyl  10 mg Oral Daily   Or  . bisacodyl  10 mg Rectal Daily  . docusate sodium  200 mg Oral Daily  . furosemide  80 mg Oral BID  . insulin aspart  0-24 Units Subcutaneous TID AC & HS  . levalbuterol  0.63 mg Nebulization BID  . metFORMIN  500 mg Oral BID WC  . metoprolol tartrate  25 mg Oral TID  . pantoprazole  40 mg Oral Q1200  . potassium chloride  30 mEq Oral BID  . sodium chloride  10-40 mL Intracatheter Q12H  . sodium chloride  3 mL Intravenous Q12H  . warfarin  2.5 mg Oral q1800  . Warfarin - Physician Dosing Inpatient   Does not apply q1800  . DISCONTD: levalbuterol  0.63 mg Nebulization TID     Radiology/Studies:  No results found.  INR:1.12 Will add last  result for INR, ABG once components are confirmed Will add last 4 CBG results once components are confirmed  Assessment/Plan: S/P Procedure(s) (LRB): CORONARY ARTERY BYPASS GRAFTING (CABG) (N/A) AORTIC VALVE REPLACEMENT (AVR) (N/A) MYOMECTOMY (N/A)  1. Stable and progressing slowly but steady 2. Cont diuresis, renal fxn fairly stable- monitor 3. Good control CBG's 81-155 range 4. cdiff negative 5. Coumadin RX 6. pulm toilet 7. Cont protonix  LOS: 9 days    GOLD,[name] 3/30/20131:42 PM   Agree with above.  Plan return to SNF at discharge.

## 2011-07-13 LAB — BASIC METABOLIC PANEL
Calcium: 9 mg/dL (ref 8.4–10.5)
GFR calc Af Amer: 48 mL/min — ABNORMAL LOW (ref >90)
GFR calc non Af Amer: 41 mL/min — ABNORMAL LOW (ref >90)
Potassium: 4.3 mEq/L (ref 3.5–5.1)
Sodium: 142 mEq/L (ref 135–145)

## 2011-07-13 LAB — GLUCOSE, CAPILLARY
Glucose-Capillary: 123 mg/dL — ABNORMAL HIGH (ref 70–99)
Glucose-Capillary: 150 mg/dL — ABNORMAL HIGH (ref 70–99)
Glucose-Capillary: 124 mg/dL — ABNORMAL HIGH (ref 70–99)

## 2011-07-13 LAB — PROTIME-INR
INR: 1.38 (ref 0.00–1.49)
Prothrombin Time: 17.2 seconds — ABNORMAL HIGH (ref 11.6–15.2)

## 2011-07-13 MED ORDER — FUROSEMIDE 80 MG PO TABS
80.0000 mg | ORAL_TABLET | Freq: Every day | ORAL | Status: DC
  Administered 2011-07-14 – 2011-07-16 (×3): 80 mg via ORAL
  Filled 2011-07-13 (×3): qty 1

## 2011-07-13 NOTE — Progress Notes (Signed)
Pt converted to A-fib. [place] notified. Metoprolol 25 mg given. Will continue to monitor.

## 2011-07-13 NOTE — Progress Notes (Signed)
Patient ID: Melinda Vang, female   DOB: 26-Feb-1946, 66 y.o.   MRN: [id number]   The patient is holding normal sinus rhythm. She is receiving amiodarone. Daryel November, MD

## 2011-07-13 NOTE — Progress Notes (Addendum)
[address]            [place] [address]          [phone]     9 Days Post-Op  Procedure(s) (LRB): CORONARY ARTERY BYPASS GRAFTING (CABG) (N/A) AORTIC VALVE REPLACEMENT (AVR) (N/A) MYOMECTOMY (N/A) Subjective: No new complaints  Objective  Telemetry SR  Temp:  [97.5 F (36.4 C)-98.9 F (37.2 C)] 97.8 F (36.6 C) (03/31 0454) Pulse Rate:  [80-91] 80  (03/31 0940) Resp:  [18-20] 19  (03/31 0454) BP: (94-108)/(60-73) 96/66 mmHg (03/31 0940) SpO2:  [93 %-97 %] 93 % (03/31 0454) Weight:  [165 lb 14.4 oz (75.252 kg)] 165 lb 14.4 oz (75.252 kg) (03/31 0500)   Intake/Output Summary (Last 24 hours) at 07/13/11 1146 Last data filed at 07/13/11 0800  Gross per 24 hour  Intake    840 ml  Output    400 ml  Net    440 ml       General appearance: alert, cooperative and no distress Heart: regular rate and rhythm and soft systolic murmur Lungs: diminished mildly in bases Abdomen: soft, nontender, + BS Extremities: edema improved Wound: incisions healing well  Lab Results:  Basename 07/13/11 0500 07/12/11 0800  NA 142 139  K 4.3 4.2  CL 101 97  CO2 31 29  GLUCOSE 114* 137*  BUN 31* 30*  CREATININE 1.30* 1.26*  CALCIUM 9.0 9.4  MG -- --  PHOS -- --   No results found for this basename: AST:2,ALT:2,ALKPHOS:2,BILITOT:2,PROT:2,ALBUMIN:2 in the last 72 hours No results found for this basename: LIPASE:2,AMYLASE:2 in the last 72 hours No results found for this basename: WBC:2,NEUTROABS:2,HGB:2,HCT:2,MCV:2,PLT:2 in the last 72 hours No results found for this basename: CKTOTAL:4,CKMB:4,TROPONINI:4 in the last 72 hours No components found with this basename: POCBNP:3 No results found for this basename: DDIMER in the last 72 hours No results found for this basename: HGBA1C in the last 72 hours No results found for this basename: CHOL,HDL,LDLCALC,TRIG,CHOLHDL in the last 72 hours No results found for this basename:  TSH,T4TOTAL,FREET3,T3FREE,THYROIDAB in the last 72 hours No results found for this basename: VITAMINB12,FOLATE,FERRITIN,TIBC,IRON,RETICCTPCT in the last 72 hours  Medications: Scheduled    . amiodarone  200 mg Oral BID PC  . aspirin EC  81 mg Oral Daily  . atorvastatin  20 mg Oral q1800  . bisacodyl  10 mg Oral Daily   Or  . bisacodyl  10 mg Rectal Daily  . docusate sodium  200 mg Oral Daily  . furosemide  80 mg Oral BID  . insulin aspart  0-24 Units Subcutaneous TID AC & HS  . levalbuterol  0.63 mg Nebulization BID  . metFORMIN  500 mg Oral BID WC  . metoprolol tartrate  25 mg Oral TID  . pantoprazole  40 mg Oral Q1200  . potassium chloride  30 mEq Oral BID  . sodium chloride  10-40 mL Intracatheter Q12H  . sodium chloride  3 mL Intravenous Q12H  . warfarin  2.5 mg Oral q1800  . Warfarin - Physician Dosing Inpatient   Does not apply q1800     Radiology/Studies:  No results found.  INR:1.38 Will add last result for INR, ABG once components are confirmed Will add last 4 CBG results once components are confirmed  Assessment/Plan: S/P Procedure(s) (LRB): CORONARY ARTERY BYPASS GRAFTING (CABG) (N/A) AORTIC VALVE REPLACEMENT (AVR) (N/A) MYOMECTOMY (N/A) Steady progress  on current TX/RX, will reduce lasix   LOS: 10 days    MelindaWAYNE Vang 3/31/201311:46 AM    Agree with above.

## 2011-07-14 ENCOUNTER — Inpatient Hospital Stay (HOSPITAL_COMMUNITY): Payer: Medicare Other

## 2011-07-14 LAB — GLUCOSE, CAPILLARY
Glucose-Capillary: 167 mg/dL — ABNORMAL HIGH (ref 70–99)
Glucose-Capillary: 126 mg/dL — ABNORMAL HIGH (ref 70–99)

## 2011-07-14 MED ORDER — METOPROLOL TARTRATE 1 MG/ML IV SOLN
5.0000 mg | Freq: Four times a day (QID) | INTRAVENOUS | Status: DC
  Administered 2011-07-14 – 2011-07-15 (×5): 5 mg via INTRAVENOUS
  Filled 2011-07-14 (×9): qty 5

## 2011-07-14 MED ORDER — POTASSIUM CHLORIDE CRYS ER 20 MEQ PO TBCR
30.0000 meq | EXTENDED_RELEASE_TABLET | Freq: Every day | ORAL | Status: DC
  Administered 2011-07-15 – 2011-07-16 (×2): 30 meq via ORAL
  Filled 2011-07-14 (×2): qty 1

## 2011-07-14 MED FILL — Heparin Sodium (Porcine) Inj 1000 Unit/ML: INTRAMUSCULAR | Qty: 2 | Status: AC

## 2011-07-14 MED FILL — Heparin Sodium (Porcine) Inj 1000 Unit/ML: INTRAMUSCULAR | Qty: 30 | Status: AC

## 2011-07-14 MED FILL — Sodium Bicarbonate IV Soln 8.4%: INTRAVENOUS | Qty: 50 | Status: AC

## 2011-07-14 MED FILL — Sodium Chloride Irrigation Soln 0.9%: Qty: 3000 | Status: AC

## 2011-07-14 MED FILL — Sodium Chloride IV Soln 0.9%: INTRAVENOUS | Qty: 1000 | Status: AC

## 2011-07-14 MED FILL — Lidocaine HCl IV Inj 20 MG/ML: INTRAVENOUS | Qty: 5 | Status: AC

## 2011-07-14 MED FILL — Mannitol IV Soln 20%: INTRAVENOUS | Qty: 500 | Status: AC

## 2011-07-14 MED FILL — Electrolyte-R (PH 7.4) Solution: INTRAVENOUS | Qty: 4000 | Status: AC

## 2011-07-14 NOTE — Progress Notes (Addendum)
Subjective:  Mrs. Eckard continues to complain of nausea.  She denies vomiting, but states she feels like she is going to be sick.  Objective:  Vital Signs in the last 24 hours: Temp:  [98 F (36.7 C)-99.1 F (37.3 C)] 99.1 F (37.3 C) (04/01 0500) Pulse Rate:  [80-118] 98  (04/01 0500) Resp:  [18] 18  (04/01 0500) BP: (96-107)/(60-69) 105/60 mmHg (04/01 0500) SpO2:  [91 %-96 %] 96 % (04/01 0500) Weight:  [165 lb 9.1 oz (75.1 kg)] 165 lb 9.1 oz (75.1 kg) (04/01 0500)  Intake/Output from previous day: 03/31 0701 - 04/01 0700 In: 720 [P.O.:720] Out: -  Intake/Output from this shift:    Physical Exam: General appearance: alert and no distress Lungs: diminished breath sounds bibasilar Heart: regular rate and rhythm Abdomen: soft, non-tender; bowel sounds normal; no masses,  no organomegaly Extremities: edema 3+, venous stasis dermatitis noticed Skin: incisions C/D/I  Lab Results: No results found for this basename: WBC:2,HGB:2,PLT:2 in the last 72 hours  Basename 07/13/11 0500 07/12/11 0800  NA 142 139  K 4.3 4.2  CL 101 97  CO2 31 29  GLUCOSE 114* 137*  BUN 31* 30*  CREATININE 1.30* 1.26*   No results found for this basename: TROPONINI:2,CK,MB:2 in the last 72 hours Hepatic Function Panel No results found for this basename: PROT,ALBUMIN,AST,ALT,ALKPHOS,BILITOT,BILIDIR,IBILI in the last 72 hours No results found for this basename: CHOL in the last 72 hours No results found for this basename: PROTIME in the last 72 hours  Assessment/Plan:   1. S/P CABG,AVR,Myectomy 2. CV- NSR, Cardiology placed patient on IV Metoprolol due to patients nausea complaint unable to tolerate PO regimen 3. Nausea- persistent complaint, patient on protonix and zofran with some relief 4. CBGs- good control 5. Fluid overload- will continue Lasix, patient has 3+ pitting edema, weight below admission weight 6. INR 1.48, will give 2.5mg  tonight 7.Dispo- patient is medically stable, continued  complaints of nausea, she will be d/c to nursing home once bed available  LOS: 11 days    BARRETT, ERIN 07/14/2011, 7:36 AM   Patient seen and examined. Agree with above. She says nausea better this afternoon. Still has a little, but able to tolerate it now.

## 2011-07-14 NOTE — Progress Notes (Signed)
    Subjective:  Severe nausea, no vomiting. No chest pain or palps.  Objective:  Vital Signs in the last 24 hours: Temp:  [98 F (36.7 C)-99.1 F (37.3 C)] 99.1 F (37.3 C) (04/01 0500) Pulse Rate:  [80-118] 98  (04/01 0500) Resp:  [18] 18  (04/01 0500) BP: (96-107)/(60-69) 105/60 mmHg (04/01 0500) SpO2:  [91 %-96 %] 96 % (04/01 0500) Weight:  [75.1 kg (165 lb 9.1 oz)] 75.1 kg (165 lb 9.1 oz) (04/01 0500)  Intake/Output from previous day: 03/31 0701 - 04/01 0700 In: 720 [P.O.:720] Out: -   Physical Exam: Pt is alert and oriented, sitting up in chair, uncomfortable but in NAD HEENT: normal Neck: JVP - normal Lungs: decreased BS on left CV: irregularly irregular  Abd: soft, Positive BS, mild abdominal discomfort Ext: diffuse edema Skin: warm/dry no rash  Lab Results: No results found for this basename: WBC:2,HGB:2,PLT:2 in the last 72 hours  Basename 07/13/11 0500 07/12/11 0800  NA 142 139  K 4.3 4.2  CL 101 97  CO2 31 29  GLUCOSE 114* 137*  BUN 31* 30*  CREATININE 1.30* 1.26*   No results found for this basename: TROPONINI:2,CK,MB:2 in the last 72 hours  Tele: atrial fib heart rate 100-120  Assessment/Plan:  1. NSTEMI - s/p CABG for critical left main disease, concomitant AVR 2. Post-op atrial fibrillation 3. Diabetes - Type 2 4. Nausea  Concerned nausea secondary to amiodarone. Will check abdominal XRay but abdominal exam fairly benign. Start IV metoprolol as she is unable to take PO this am. Continue warfarin. Otherwise continue same meds as tolerated. Needs ACE or ARB but BP too low to start at this time.  [name], M.D. 07/14/2011, 6:58 AM

## 2011-07-14 NOTE — Progress Notes (Signed)
Physical Therapy Treatment Patient Details Name: Melinda Vang MRN: [id number] DOB: [birth year] Today's Date: 07/14/2011  PT Assessment/Plan  PT - Assessment/Plan Comments on Treatment Session: Pt fatigued towards end of ambulation today increasing her anxiety and impulsivity needing mod cues to slow down and increase safety. Encouraged pt to ambulate multiple x daily however pt reports she "just can't."  PT Plan: Discharge plan remains appropriate;Frequency remains appropriate Follow Up Recommendations: Skilled nursing facility Equipment Recommended: Defer to next venue PT Goals  Acute Rehab PT Goals PT Goal: Sit to Stand - Progress: Progressing toward goal PT Goal: Stand to Sit - Progress: Progressing toward goal PT Goal: Ambulate - Progress: Progressing toward goal  PT Treatment Precautions/Restrictions  Precautions Precautions: Fall;Sternal Precaution Comments: pt with cognitive delay.   Required Braces or Orthoses: No Restrictions Weight Bearing Restrictions: No Mobility (including Balance) Bed Mobility Bed Mobility: No (pt upright in chair) Transfers Sit to Stand: 4: Min assist;From chair/3-in-1 Sit to Stand Details (indicate cue type and reason): cues for hands on knees/sternal precautions and min facilitation for follow through Stand to Sit: 4: Min assist;To chair/3-in-1 Stand to Sit Details: sequencing and safety cues; pt very impulsive towards end of session trying to sit prior to safe preparation/positioning; mod v/c's to slow down; cues for hand placement Ambulation/Gait Ambulation/Gait Assistance: 4: Min assist Ambulation/Gait Assistance Details (indicate cue type and reason): cues for safe use of RW; pt's hands sliding anteriorly on RW as she flexes forward with fatigue; cues to step into RW and for upright posture Ambulation Distance (Feet): 200 Feet Assistive device: Rolling walker Gait Pattern: Trunk flexed    Exercise  General Exercises - Lower  Extremity Ankle Circles/Pumps: AROM;10 reps;Seated;Both End of Session PT - End of Session Equipment Utilized During Treatment: Gait belt Activity Tolerance: Patient tolerated treatment well;Patient limited by fatigue Patient left: in chair;with call bell in reach Nurse Communication:  (RN observed pt ambulating) General Behavior During Session: Flat affect Cognition: Impaired Cognitive Impairment: slower processing  Melinda Vang 07/14/2011, 2:59 PM

## 2011-07-14 NOTE — Progress Notes (Signed)
Pt with bed available at [facility] in [place]. Pt sister has accepted this bed offer on behalf of pt. Plan for d/c to SNF 4/2.  CSW will continue to follow to facilitate d/c planning tomorrow. Pt will transport by [facility].  Jerral Ralph, MSW [phone]

## 2011-07-14 NOTE — Progress Notes (Signed)
CARDIAC REHAB PHASE I   PRE:  Rate/Rhythm: 111 AFIB  BP:  Supine:   Sitting: 100/61  Standing:    SaO2: 94%RA  MODE:  Ambulation: 150 ft   POST:  Rate/Rhythem: 140 AFIB walkling. 100 rest  BP:  Supine:   Sitting: 111/74  Standing:    SaO2: 96%RA 0845-017 Pt very nauseated. Encouraged pt to try to walk as tolerated. Able to walk 150 ft on RA with rolling walker and asst x 2. Heart rate to 140 during walk.  Gave gingerale to pt. To recliner with  Call bell.  Positive reenforcement given for walking.  Jeani Sow

## 2011-07-15 ENCOUNTER — Other Ambulatory Visit: Payer: Self-pay

## 2011-07-15 LAB — GLUCOSE, CAPILLARY
Glucose-Capillary: 170 mg/dL — ABNORMAL HIGH (ref 70–99)
Glucose-Capillary: 143 mg/dL — ABNORMAL HIGH (ref 70–99)

## 2011-07-15 LAB — PROTIME-INR
INR: 1.66 — ABNORMAL HIGH (ref 0.00–1.49)
Prothrombin Time: 19.9 seconds — ABNORMAL HIGH (ref 11.6–15.2)

## 2011-07-15 MED ORDER — PANTOPRAZOLE SODIUM 40 MG PO TBEC: 40.0000 mg | DELAYED_RELEASE_TABLET | Freq: Every day | ORAL | Status: DC

## 2011-07-15 MED ORDER — ATORVASTATIN CALCIUM 20 MG PO TABS: 20.0000 mg | ORAL_TABLET | Freq: Every day | ORAL | Status: DC

## 2011-07-15 MED ORDER — ACETAMINOPHEN 500 MG PO TABS: 1000.0000 mg | ORAL_TABLET | Freq: Four times a day (QID) | ORAL | Status: DC | PRN

## 2011-07-15 MED ORDER — ASPIRIN 81 MG PO TBEC: 81.0000 mg | DELAYED_RELEASE_TABLET | Freq: Every day | ORAL | Status: AC

## 2011-07-15 MED ORDER — METOPROLOL TARTRATE 25 MG PO TABS
25.0000 mg | ORAL_TABLET | Freq: Two times a day (BID) | ORAL | Status: DC
  Administered 2011-07-15 – 2011-07-16 (×3): 25 mg via ORAL
  Filled 2011-07-15 (×4): qty 1

## 2011-07-15 MED ORDER — METOPROLOL TARTRATE 12.5 MG HALF TABLET
12.5000 mg | ORAL_TABLET | Freq: Two times a day (BID) | ORAL | Status: DC
  Filled 2011-07-15 (×2): qty 1

## 2011-07-15 MED ORDER — WARFARIN SODIUM 2.5 MG PO TABS: 2.5000 mg | ORAL_TABLET | Freq: Every day | ORAL | Status: DC

## 2011-07-15 MED ORDER — TRAMADOL HCL 50 MG PO TABS: 50.0000 mg | ORAL_TABLET | Freq: Four times a day (QID) | ORAL | Status: AC | PRN

## 2011-07-15 MED ORDER — METOPROLOL TARTRATE 25 MG PO TABS: 25.0000 mg | ORAL_TABLET | Freq: Two times a day (BID) | ORAL | Status: DC

## 2011-07-15 NOTE — Progress Notes (Signed)
UR Completed.  Melinda Vang 07/15/2011 [id number]

## 2011-07-15 NOTE — Progress Notes (Signed)
CSW reviewed chart, spoke with RN, aware of afib, and no discussion as to disposition at this time per MD. Pt continues to have bed available at [facility] will continue to follow to facilitate d/c to SNF when pt is medically ready.  Jerral Ralph, MSW [phone]

## 2011-07-15 NOTE — Progress Notes (Signed)
CARDIAC REHAB PHASE I   PRE:  Rate/Rhythm: 91 SR    BP: sitting 121/58    SaO2: 91  RA  MODE:  Ambulation: 250 ft   POST:  Rate/Rhythm: 98 SR    BP: sitting 137/66     SaO2: 97 RA  Assist x2 with RW. Increased distance today. Still fatigues easily. Struggles to stand tall and not lean over RW. Made her rest x1. To recliner. VSS. [id number]  Darrick Meigs CES, ACSM

## 2011-07-15 NOTE — Clinical Documentation Improvement (Signed)
GENERIC DOCUMENTATION CLARIFICATION QUERY  THIS DOCUMENT IS NOT A PERMANENT PART OF THE MEDICAL RECORD  TO RESPOND TO THE THIS QUERY, FOLLOW THE INSTRUCTIONS BELOW:  1. If needed, update documentation for the patient's encounter via the notes activity.  2. Access this query again and click edit on the In [employer].  3. After updating, or not, click F2 to complete all highlighted (required) fields concerning your review. Select "additional documentation in the medical record" OR "no additional documentation provided".  4. Click Sign note button.  5. The deficiency will fall out of your In Basket *Please let us know if you are not able to complete this workflow by phone or e-mail (listed below).  Please update your documentation within the medical record to reflect your response to this query.                                                                                        07/15/11   Dear Dr. Burt Knack / Associates,  In a better effort to capture your patient's severity of illness, reflect appropriate length of stay and utilization of resources, a review of the patient medical record has revealed the following indicators.  THANK YOU FOR CLARIFYING THE TERM "POST-OP VOLUME OVERLOAD".   Possible Clinical Conditions? - Acute pulmonary edema - Acute CHF (specify type) - Other Condition (please specify) - Cannot Clinically Determine  Supporting Information: - Risk Factors: s/p emergent CABG for critical left main disease, concomitant AVR, postop Afib RVR, NSTEMI - Signs & Symptoms: 4/2 3+ edema, 4/1 diminished breath sounds bibasilar,  Fluid overload, 3/30 wheezes throughout  lung fields, 3+ BLE edema - Diagnostics: CXR 3/24:"mild pulmonary edema with small bilateral effusions" - Treatment: 3/29 Encourage incentive spirometer, Xopenex bid, 3/30 pulm toilet, 3/31 Lasix 80mg  tablet given x2,  4/1 Lasix 80mg  tablet daily start today  You may use possible, probable, or suspect with  inpatient documentation. possible, probable, suspected diagnoses MUST be documented at the time of discharge  Reviewed:  no additional documentation provided  Thank You,  Ezekiel Ina RN Clinical Documentation Specialist: [place]

## 2011-07-15 NOTE — Progress Notes (Signed)
Pt ambulated 150 ft with RW and 1 assist with no complaints.  Needed reminders to walk tall and keep walker close.  Appreciative of walk.  Returned to recliner with call bell in reach.  HR high 90's.

## 2011-07-15 NOTE — Discharge Summary (Signed)
Physician Discharge Summary  Patient ID: Melinda Vang MRN: [id number] DOB/AGE: [birth year] 66 y.o.  Admit date: 07/03/2011 Discharge date: 07/15/2011  Admission Diagnoses:  Patient Active Problem List  Diagnoses  . NSTEMI (non-ST elevated myocardial infarction)  . DM (diabetes mellitus)  . Dyslipidemia  . Atrial fibrillation  . Anemia   Discharge Diagnoses:   Patient Active Problem List  Diagnoses  . NSTEMI (non-ST elevated myocardial infarction)  . DM (diabetes mellitus)  . Dyslipidemia  . Atrial fibrillation  . Anemia  . S/P CABG x 4  . S/P AVR (aortic valve replacement)   Discharged Condition: good  History of Present Illness:  Melinda Vang is a 66 yo female who resides at [facility] unit.  The patient developed chest pressure and substernal chest pain that radiated into her Right jaw and bilateral upper extremities.  Initially the patient stated the pain was 10/10 with associated nausea and vomiting.  The patient did not call anyone for help.  While visiting with her family the next day, they noticed the patient looked "unwell."  The patient told her family that she had experienced some chest pain and that she was not feeling well.  They decided to take her to the [facility] Emergency Department. Workup revealed the patient to be suffering from Atrial Fibrillation with RVR.  She responded to Metoprolol with conversion to NSR.  Her cardiac enzymes were also elevated.  At that time it was felt the patient should be transferred to [facility] for further evaluation.      Hospital Course:   The patient was transferred to [facility] on 07/03/2011.  Upon arrival she was evaluated by Dr. Harrington Challenger on the Cardiology service. Upon arrival the patient was chest pain free.  She was ruled in for a NSTEMI.  The patient was placed on Heparin and scheduled for cardiac catheterization.  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   Therefore, cardiothoracic surgery was consulted for possible emergent coronary bypass procedure.  Dr. Roxy Manns evaluated the patient and agreed with Cardiology assessment.  He spoke with the patient and her family regarding risks and benefits of proceeding with bypass procedure.  They were agreeable to proceed and the patient was taken emergently to the operating room.  The patient underwent evaluation with a TEE prior to surgery and was found to have moderate aortic stenosis which resulted in replacement of the patients Aortic Valve utilizing a 65mm Edwards Magna Ease Pericardial Tissue Valve.  She also underwent CABG x4 utilizing LIMA to LAD, SVG to PDA, and sequential SVG to OM, and PLVB of Circumflex.  The patient also underwent a Septal Myomectomy for a subvavular LV Outflow Tract Obstruction.  Finally the patient underwent Endoscopic Saphenous Vein Harvest from both right and left thigh.  The patient tolerated the procedure well and was taken to the Surgical ICU in stable condition.  POD #1 the patient's femoral sheath was removed without difficulty.  The patient was extubated without difficulty.  POD #2 the patients chest tubes were removed without difficulty.  The patient had maintained sinus rhythm on Amiodarone drip.  Therefore this was discontinued and she was placed on an oral regimen.  POD #3 the patient developed Atrial Fibrillation.  She was placed back on an Amiodarone drip.  Her central line was discontinued without difficulty, and she underwent PICC line placement.  The patients post chest tube pull CXR did not reveal evidence of a pneumothorax.  POD #4  the patient again converted to NSR.  Her Amiodarone drip was discontinued.   She was placed on Coumadin.  She was transferred to the stepdown unit.  POD #6 the patient converted into rate controlled atrial fibrillation.  POD #8 the patients external pacing wires were removed without difficulty.  POD #10 the patient continues to have episodes of Atrial  Fibrillation.  The patient is medically stable at this time.  She will remain on coumadin at this time.  Her INR on 07/15/2011 was 1.66, and she will continue with 2.5mg  daily.  Should no further problems arise, she will be discharged back to [facility] home in the next 24-48 hours.   Disposition: Nursing Home  Discharge Orders    Future Appointments: Provider: Department: Dept Phone: Center:   08/04/2011 9:30 AM Rexene Alberts, MD [facility] [phone] [facility]     Medication List  As of 07/15/2011  5:31 PM   STOP taking these medications         atenolol 50 MG tablet         TAKE these medications         aspirin 81 MG EC tablet   Take 1 tablet (81 mg total) by mouth daily.      atorvastatin 20 MG tablet   Commonly known as: LIPITOR   Take 1 tablet (20 mg total) by mouth daily at 6 PM.      bumetanide 1 MG tablet   Commonly known as: BUMEX   Take 2 mg by mouth daily.      ferrous sulfate 325 (65 FE) MG tablet   Take 325 mg by mouth daily with breakfast.      glipiZIDE 10 MG 24 hr tablet   Commonly known as: GLUCOTROL XL   Take 10 mg by mouth 2 (two) times daily.      metFORMIN 1000 MG tablet   Commonly known as: GLUCOPHAGE   Take 1,000 mg by mouth daily.      metoprolol tartrate 25 MG tablet   Commonly known as: LOPRESSOR   Take 1 tablet (25 mg total) by mouth 2 (two) times daily.      pantoprazole 40 MG tablet   Commonly known as: PROTONIX   Take 1 tablet (40 mg total) by mouth daily at 12 noon.      potassium chloride 10 MEQ tablet   Commonly known as: K-DUR,KLOR-CON   Take 10 mEq by mouth daily.      raloxifene 60 MG tablet   Commonly known as: EVISTA   Take 60 mg by mouth daily.      traMADol 50 MG tablet   Commonly known as: ULTRAM   Take 1 tablet (50 mg total) by mouth every 6 (six) hours as needed.      Vitamin D (Ergocalciferol) 50000 UNITS Caps   Commonly known as: DRISDOL   Take 50,000 Units by mouth every 7 (seven) days. mondays       warfarin 2.5 MG tablet   Commonly known as: COUMADIN   Take 1 tablet (2.5 mg total) by mouth daily at 6 PM.           Follow-up Information    Follow up with Rexene Alberts, MD on 08/04/2011. (Appointment time 930 AM, will need to have CXR performed at [facility] at 830  AM)    Contact information:   [place] [phone]       Follow up  with [name], MD. Call in 2 weeks.   Contact information:   [id number] [employer] [id number], [place] [phone]       Schedule an appointment as soon as possible for a visit with VYAS,DHRUV B., MD. (Need PT/INR checked 2 days after hospital d/c)    Contact information:   [address] [place] [phone]          Signed: Ellwood Handler 07/15/2011, 5:31 PM

## 2011-07-15 NOTE — Progress Notes (Signed)
Patient has returned to NSR, confirmed by EKG.  Will continue to monitor.  Melinda Vang Patient

## 2011-07-15 NOTE — Progress Notes (Signed)
    Subjective:  Continued nausea but much improved from yesterday. No chest pain or dyspnea.  Objective:  Vital Signs in the last 24 hours: Temp:  [97.8 F (36.6 C)-97.9 F (36.6 C)] 97.8 F (36.6 C) (04/02 0421) Pulse Rate:  [83-100] 83  (04/02 0421) Resp:  [18-20] 20  (04/02 0421) BP: (96-108)/(51-66) 96/66 mmHg (04/02 0421) SpO2:  [93 %-95 %] 95 % (04/02 0421) Weight:  [74.2 kg (163 lb 9.3 oz)] 74.2 kg (163 lb 9.3 oz) (04/02 0421)  Intake/Output from previous day: 04/01 0701 - 04/02 0700 In: 240 [P.O.:240] Out: 550 [Urine:550]  Physical Exam: Pt is alert and oriented, NAD HEENT: normal Neck: JVP - normal Lungs: CTA bilaterally CV: RRR with 2/6 systolic ejection murmur at LSB Abd: soft, NT, Positive BS, no hepatomegaly Ext: diffuse edema  Lab Results: No results found for this basename: WBC:2,HGB:2,PLT:2 in the last 72 hours  Basename 07/13/11 0500  NA 142  K 4.3  CL 101  CO2 31  GLUCOSE 114*  BUN 31*  CREATININE 1.30*   No results found for this basename: TROPONINI:2,CK,MB:2 in the last 72 hours  Tele: converted to sinus rhythm 8pm last night  Assessment/Plan:  1. NSTEMI - s/p CABG for critical left main disease, concomitant AVR  2. Post-op atrial fibrillation  3. Diabetes - Type 2  4. Nausea 5. Post-op volume overload  Pt improved and now in sinus rhythm. Increase metoprolol to 25 mg bid. No further amio secondary to severe nausea. Unable to add ACE or ARB secondary to low BP. INR trending upward. Otherwise as per surgical team.  [name], M.D. 07/15/2011, 8:28 AM

## 2011-07-15 NOTE — Progress Notes (Signed)
Patient did not feel up to ambulating this evening.  She said she has been sick today and is tired.  Will continue to monitor and encourage.  [place]ell Patient

## 2011-07-15 NOTE — Progress Notes (Addendum)
[address]            [place] [address]          [phone]     11 Days Post-Op  Procedure(s) (LRB): CORONARY ARTERY BYPASS GRAFTING (CABG) (N/A) AORTIC VALVE REPLACEMENT (AVR) (N/A) MYOMECTOMY (N/A) Subjective: Feel ok, no current nausea  Objective  Telemetry episodes of afib with increased rate  Temp:  [97.8 F (36.6 C)-97.9 F (36.6 C)] 97.8 F (36.6 C) (04/02 0421) Pulse Rate:  [83-100] 83  (04/02 0421) Resp:  [18-20] 20  (04/02 0421) BP: (96-108)/(51-66) 96/66 mmHg (04/02 0421) SpO2:  [93 %-95 %] 95 % (04/02 0421) Weight:  [163 lb 9.3 oz (74.2 kg)] 163 lb 9.3 oz (74.2 kg) (04/02 0421)   Intake/Output Summary (Last 24 hours) at 07/15/11 0807 Last data filed at 07/15/11 0428  Gross per 24 hour  Intake    240 ml  Output    550 ml  Net   -310 ml       General appearance: alert, cooperative and no distress Heart: regular rate and rhythm Lungs: mildly diminished in bases Abdomen: soft, non-tender; bowel sounds normal; no masses,  no organomegaly Extremities: minor edema Wound: incisions  healing well  Lab Results:  Basename 07/13/11 0500  NA 142  K 4.3  CL 101  CO2 31  GLUCOSE 114*  BUN 31*  CREATININE 1.30*  CALCIUM 9.0  MG --  PHOS --   No results found for this basename: AST:2,ALT:2,ALKPHOS:2,BILITOT:2,PROT:2,ALBUMIN:2 in the last 72 hours No results found for this basename: LIPASE:2,AMYLASE:2 in the last 72 hours No results found for this basename: WBC:2,NEUTROABS:2,HGB:2,HCT:2,MCV:2,PLT:2 in the last 72 hours No results found for this basename: CKTOTAL:4,CKMB:4,TROPONINI:4 in the last 72 hours No components found with this basename: POCBNP:3 No results found for this basename: DDIMER in the last 72 hours No results found for this basename: HGBA1C in the last 72 hours No results found for this basename: CHOL,HDL,LDLCALC,TRIG,CHOLHDL in the last 72 hours No results found for this basename:  TSH,T4TOTAL,FREET3,T3FREE,THYROIDAB in the last 72 hours No results found for this basename: VITAMINB12,FOLATE,FERRITIN,TIBC,IRON,RETICCTPCT in the last 72 hours  Medications: Scheduled    . aspirin EC  81 mg Oral Daily  . atorvastatin  20 mg Oral q1800  . bisacodyl  10 mg Oral Daily   Or  . bisacodyl  10 mg Rectal Daily  . docusate sodium  200 mg Oral Daily  . furosemide  80 mg Oral Daily  . insulin aspart  0-24 Units Subcutaneous TID AC & HS  . levalbuterol  0.63 mg Nebulization BID  . metFORMIN  500 mg Oral BID WC  . metoprolol  5 mg Intravenous Q6H  . pantoprazole  40 mg Oral Q1200  . potassium chloride  30 mEq Oral Daily  . sodium chloride  10-40 mL Intracatheter Q12H  . sodium chloride  3 mL Intravenous Q12H  . warfarin  2.5 mg Oral q1800  . Warfarin - Physician Dosing Inpatient   Does not apply q1800  . DISCONTD: potassium chloride  30 mEq Oral BID     Radiology/Studies:  Dg Abd 1 View  07/14/2011  *RADIOLOGY REPORT*  Clinical Data: Nausea  ABDOMEN - 1 VIEW  Comparison: None.  Findings: No disproportionate dilatation of bowel.  Degenerative changes in the lumbar spine.  Vascular calcifications project over the pelvis.  No obvious free intraperitoneal gas.  IMPRESSION: Nonobstructive bowel gas pattern.  Original Report Authenticated By: Jamas Lav, M.D.    INR:1.66 Will add last result for INR, ABG once components are confirmed Will add last 4 CBG results once components are confirmed  Assessment/Plan: S/P Procedure(s) (LRB): CORONARY ARTERY BYPASS GRAFTING (CABG) (N/A) AORTIC VALVE REPLACEMENT (AVR) (N/A) MYOMECTOMY (N/A)  1. Cont AC rx, add low dose beta blocker   LOS: 12 days    GOLD,[name] 4/2/20138:07 AM    In SR currently. She says nausea is better, but not completely resolved. C/o incisional pain - has not taken ultram, will order prn tylenol

## 2011-07-15 NOTE — Discharge Instructions (Signed)
Coronary Artery Bypass Grafting Care After Refer to this sheet in the next few weeks. These instructions provide you with information on caring for yourself after your procedure. Your caregiver may also give you more specific instructions. Your treatment has been planned according to current medical practices, but problems sometimes occur. Call your caregiver if you have any problems or questions after your procedure.  Recovery from open heart surgery will be different for everyone. Some people feel well after 3 or 4 weeks, while for others it takes longer. After heart surgery, it may be normal to:  Not have an appetite, feel nauseated by the smell of food, or only want to eat a small amount.   Be constipated because of changes in your diet, activity, and medicines. Eat foods high in fiber. Add fresh fruits and vegetables to your diet. Stool softeners may be helpful.   Feel sad or unhappy. You may be frustrated or cranky. You may have good days and bad days. Do not give up. Talk to your caregiver if you do not feel better.   Feel weakness and fatigue. You many need physical therapy or cardiac rehabilitation to get your strength back.   Develop an irregular heartbeat called atrial fibrillation. Symptoms of atrial fibrillation are a fast, irregular heartbeat or feelings of fluttery heartbeats, shortness of breath, low blood pressure, and dizziness. If these symptoms develop, see your caregiver right away.  MEDICATION  Have a list of all the medicines you will be taking when you leave the hospital. For every medicine, know the following:   Name.   Exact dose.   Time of day to be taken.   How often it should be taken.   Why you are taking it.   Ask which medicines should or should not be taken together. If you take more than one heart medicine, ask if it is okay to take them together. Some heart medicines should not be taken at the same time because they may lower your blood pressure too  much.   Narcotic pain medicine can cause constipation. Eat fresh fruits and vegetables. Add fiber to your diet. Stool softener medicine may help relieve constipation.   Keep a copy of your medicines with you at all times.   Do not add or stop taking any medicine until you check with your caregiver.   Medicines can have side effects. Call your caregiver who prescribed the medicine if you:   Start throwing up, have diarrhea, or have stomach pain.   Feel dizzy or lightheaded when you stand up.   Feel your heart is skipping beats or is beating too fast or too slow.   Develop a rash.   Notice unusual bruising or bleeding.  HOME CARE INSTRUCTIONS  After heart surgery, it is important to learn how to take your pulse. Have your caregiver show you how to take your pulse.   Use your incentive spirometer. Ask your caregiver how long after surgery you need to use it.  Care of your chest incision  Tell your caregiver right away if you notice clicking in your chest (sternum).   Support your chest with a pillow or your arms when you take deep breaths and cough.   Follow your caregiver's instructions about when you can bathe or swim.   Protect your incision from sunlight during the first year to keep the scar from getting dark.   Tell your caregiver if you notice:   Increased tenderness of your incision.   Increased redness  or swelling around your incision.   Drainage or pus from your incision.  Care of your leg incision(s)  Avoid crossing your legs.   Avoid sitting for long periods of time. Change positions every half hour.   Elevate your leg(s) when you are sitting.   Check your leg(s) daily for swelling. Check the incisions for redness or drainage.   Wear your elastic stockings as told by your caregiver. Take them off at bedtime.  Diet  Diet is very important to heart health.   Eat plenty of fresh fruits and vegetables. Meats should be lean cut. Avoid canned, processed, and  fried foods.   Talk to a dietician. They can teach you how to make healthy food and drink choices.  Weight  Weigh yourself every day. This is important because it helps to know if you are retaining fluid that may make your heart and lungs work harder.   Use the same scale each time.   Weigh yourself every morning at the same time. You should do this after you go to the bathroom, but before you eat breakfast.   Your weight will be more accurate if you do not wear any clothes.   Record your weight.   Tell your caregiver if you have gained 2 pounds or more overnight.  Activity Stop any activity at once if you have chest pain, shortness of breath, irregular heartbeats, or dizziness. Get help right away if you have any of these symptoms.  Bathing.  Avoid soaking in a bath or hot tub until your incisions are healed.   Rest. You need a balance of rest and activity.   Exercise. Exercise per your caregiver's advice. You may need physical therapy or cardiac rehabilitation to help strengthen your muscles and build your endurance.   Climbing stairs. Unless your caregiver tells you not to climb stairs, go up stairs slowly and rest if you tire. Do not pull yourself up by the handrail.   Driving a car. Follow your caregiver's advice on when you may drive. You may ride as a passenger at any time. When traveling for long periods of time in a car, get out of the car and walk around for a few minutes every 2 hours.   Lifting. Avoid lifting, pushing, or pulling anything heavier than 10 pounds for 6 weeks after surgery or as told by your caregiver.   Returning to work. Check with your caregiver. People heal at different rates. Most people will be able to go back to work 6 to 12 weeks after surgery.   Sexual activity. You may resume sexual relations as told by your caregiver.  SEEK MEDICAL CARE IF:  Any of your incisions are red, painful, or have any type of drainage coming from them.   You have an  oral temperature above 102 F (38.9 C).   You have ankle or leg swelling.   You have pain in your legs.   You have weight gain of 2 or more pounds a day.   You feel dizzy or lightheaded when you stand up.  SEEK IMMEDIATE MEDICAL CARE IF:  You have angina or chest pain that goes to your jaw or arms. Call your local emergency services right away.   You have shortness of breath at rest or with activity.   You have a fast or irregular heartbeat (arrhythmia).   There is a "clicking" in your sternum when you move.   You have numbness or weakness in your arms or legs.  MAKE SURE YOU:  Understand these instructions.   Will watch your condition.   Will get help right away if you are not doing well or get worse.  Document Released: 10/18/2004 Document Revised: 03/20/2011 Document Reviewed: 06/05/2010 [facility] Patient Information 2012 [place].  Aortic Valve Replacement Care After Read the instructions outlined below and refer to this sheet for the next few weeks. These discharge instructions provide you with general information on caring for yourself after you leave the hospital. Your surgeon may also give you specific instructions. While your treatment has been planned according to the most current medical practices available, unavoidable complications occasionally occur. If you have any problems or questions after discharge, please call your surgeon. AFTER THE PROCEDURE  Full recovery from heart valve surgery can take several months.   Blood thinning (anticoagulation) treatment with warfarin is often prescribed for 6 weeks to 3 months after surgery for those with biological valves. It is prescribed for life for those with mechanical valves.   Recovery includes healing of the surgical incision. There is a gradual building of stamina and exercise abilities. An exercise program under the direction of a physical therapist may be recommended.   Once you have an artificial valve,  your heart function and your life will return to normal. You usually feel better after surgery. Shortness of breath and fatigue should lessen. If your heart was already severely damaged before your surgery, you may continue to have problems.   You can usually resume most of your normal activities. You will have to continue to monitor your condition. You need to watch out for blood clots and infections.   Artificial valves need to be replaced after a period of time. It is important that you see your caregiver regularly.   Some individuals with an aortic valve replacement need to take antibiotics before having dental work or other surgical procedures. This is called prophylactic antibiotic treatment. These drugs help to prevent infective endocarditis. Antibiotics are only recommended for individuals with the highest risk for developing infective endocarditis. Let your dentist and your caregiver know if you have a history of any of the following so that the necessary precautions can be taken:   A VSD.   A repaired VSD.   Endocarditis in the past.   An artificial (prosthetic) heart valve.  HOME CARE INSTRUCTIONS   Use all medications as prescribed.   Take your temperature every morning for the first week after surgery. Record these.   Weigh yourself every morning for at least the first week after surgery and record.   Do not lift more than 10 pounds (4.5 kg) until your breastbone (sternum) has healed. Avoid all activities which would place strain on your incision.   You may shower as soon as directed by your caregiver after surgery. Pat incisions dry. Do not rub incisions with washcloth or towel.   Avoid driving for 4 to 6 weeks following surgery or as instructed.   Use your elastic stockings during the day. You should wear the stockings for at least 2 weeks after discharge or longer if your ankles are swollen. The stockings help blood flow and help reduce swelling in the legs. It is easiest  to put the stockings on before you get out of bed in the morning. They should fit snugly.  Pain Control  If a prescription was given for a pain reliever, please follow your doctor's directions.   If the pain is not relieved by your medicine, becomes worse, or you  have difficulty breathing, call your surgeon.  Activity  Take frequent rest periods throughout the day.   Wait one week before returning to strenuous activities such as heavy lifting (more than 10 pounds), pushing or pulling.   Talk with your doctor about when you may return to work and your exercise routine.   Do not drive while taking prescription pain medication.  Nutrition  You may resume your normal diet.   Drink plenty of fluids (6-8 glasses a day).   Eat a well-balanced diet.   Call your caregiver for persistent nausea or vomiting.  Elimination Your normal bowel function should return. If constipation should occur, you may:  Take a mild laxative.   Add fruit and bran to your diet.   Drink more fluids.   Call your doctor if constipation is not relieved.  SEEK IMMEDIATE MEDICAL CARE IF:   You develop chest pain which is not coming from your surgical cut (incision).   You develop shortness of breath or have difficulty breathing.   You develop a temperature over 101 F (38.3 C).   You have a sudden weight gain. Let your caregiver know what the weight gain is.   You develop a rash.   You develop any reaction or side effects to medications given.   You have increased bleeding from wounds.   You see redness, swelling, or have increasing pain in wounds.   You have pus coming from your wound.   You develop lightheadedness or feel faint.  Document Released: 10/17/2004 Document Revised: 03/20/2011 Document Reviewed: 01/08/2005 [facility] Patient Information 2012 [place].  Endoscopic Saphenous Vein Harvesting Care After Refer to this sheet in the next few weeks. These instructions provide you  with information on caring for yourself after your procedure. Your caregiver may also give you more specific instructions. Your treatment has been planned according to current medical practices, but problems sometimes occur. Call your caregiver if you have any problems or questions after your procedure. HOME CARE INSTRUCTIONS Medicine  Take whatever pain medicine your surgeon prescribes. Follow the directions carefully. Do not take over-the-counter pain medicine unless your surgeon says it is okay. Some pain medicine can cause bleeding problems for several weeks after surgery.   Follow your surgeon's instructions about driving. You will probably not be permitted to drive after heart surgery.   Take any medicines your surgeon prescribes. Any medicines you took before your heart surgery should be checked with your caregiver before you start taking them again.  Wound care  Ask your surgeon how long you should keep wearing your elastic bandage or stocking.   Check the area around your surgical cuts (incisions) whenever your bandages (dressings) are changed. Look for any redness or swelling.   You will need to return to have the stitches (sutures) or staples taken out. Ask your surgeon when to do that.   Ask your surgeon when you can shower or bathe.  Activity  Try to keep your legs raised when you are sitting.   Do any exercises your caregivers have given you. These may include deep breathing exercises, coughing, walking, or other exercises.  SEEK MEDICAL CARE IF:  You have any questions about your medicines.   You have more leg pain, especially if your pain medicine stops working.   New or growing bruises develop on your leg.   Your leg swells, feels tight, or becomes red.   You have numbness in your leg.  SEEK IMMEDIATE MEDICAL CARE IF:  Your pain gets  much worse.   Blood or fluid leaks from any of the incisions.   Your incisions become warm, swollen, or red.   You have chest  pain.   You have trouble breathing.   You have a fever.   You have more pain near your leg incision.  MAKE SURE YOU:  Understand these instructions.   Will watch your condition.   Will get help right away if you are not doing well or get worse.  Document Released: 12/11/2010 Document Revised: 03/20/2011 Document Reviewed: 12/11/2010 [facility] Patient Information 2012 [place].

## 2011-07-16 ENCOUNTER — Other Ambulatory Visit: Payer: Self-pay | Admitting: Internal Medicine

## 2011-07-16 LAB — GLUCOSE, CAPILLARY: Glucose-Capillary: 143 mg/dL — ABNORMAL HIGH (ref 70–99)

## 2011-07-16 LAB — PROTIME-INR: INR: 1.88 — ABNORMAL HIGH (ref 0.00–1.49)

## 2011-07-16 LAB — BASIC METABOLIC PANEL
CO2: 27 mEq/L (ref 19–32)
Calcium: 9 mg/dL (ref 8.4–10.5)
GFR calc non Af Amer: 40 mL/min — ABNORMAL LOW (ref >90)
Glucose, Bld: 164 mg/dL — ABNORMAL HIGH (ref 70–99)
Potassium: 4.2 mEq/L (ref 3.5–5.1)
Sodium: 135 mEq/L (ref 135–145)

## 2011-07-16 NOTE — Progress Notes (Signed)
D/c packet placed in [facility], awaiting MD signature on FL2. RN to call for PTAR when FL2 is signed.   CSW signing off, with thanks to RN for helping to facilitate d/c for this pt.   Jerral Ralph, MSW [phone]

## 2011-07-16 NOTE — Progress Notes (Addendum)
[address]            [place] [address]          [id number]     12 Days Post-Op  Procedure(s) (LRB): CORONARY ARTERY BYPASS GRAFTING (CABG) (N/A) AORTIC VALVE REPLACEMENT (AVR) (N/A) MYOMECTOMY (N/A) Subjective: No new complaints  Objective  Telemetry SR/Stach  Temp:  [97.5 F (36.4 C)-98.2 F (36.8 C)] 97.5 F (36.4 C) (04/03 0504) Pulse Rate:  [88-93] 88  (04/03 0504) Resp:  [19-20] 19  (04/03 0504) BP: (121-129)/(62-66) 124/65 mmHg (04/03 0504) SpO2:  [96 %-100 %] 100 % (04/03 0504) Weight:  [163 lb 5.8 oz (74.1 kg)] 163 lb 5.8 oz (74.1 kg) (04/03 0504)   Intake/Output Summary (Last 24 hours) at 07/16/11 0756 Last data filed at 07/15/11 1700  Gross per 24 hour  Intake    840 ml  Output    851 ml  Net    -11 ml       General appearance: alert, cooperative and no distress Heart: regular rate and rhythm and S1, S2 normal Lungs: diminished in bases Abdomen: soft/ nontender, + BS Extremities: min edema Wound: incisions healing well  Lab Results:  Basename 07/16/11 0532  NA 135  K 4.2  CL 94*  CO2 27  GLUCOSE 164*  BUN 32*  CREATININE 1.34*  CALCIUM 9.0  MG --  PHOS --   No results found for this basename: AST:2,ALT:2,ALKPHOS:2,BILITOT:2,PROT:2,ALBUMIN:2 in the last 72 hours No results found for this basename: LIPASE:2,AMYLASE:2 in the last 72 hours No results found for this basename: WBC:2,NEUTROABS:2,HGB:2,HCT:2,MCV:2,PLT:2 in the last 72 hours No results found for this basename: CKTOTAL:4,CKMB:4,TROPONINI:4 in the last 72 hours No components found with this basename: POCBNP:3 No results found for this basename: DDIMER in the last 72 hours No results found for this basename: HGBA1C in the last 72 hours No results found for this basename: CHOL,HDL,LDLCALC,TRIG,CHOLHDL in the last 72 hours No results found for this basename: TSH,T4TOTAL,FREET3,T3FREE,THYROIDAB in the last 72 hours No results found for this  basename: VITAMINB12,FOLATE,FERRITIN,TIBC,IRON,RETICCTPCT in the last 72 hours  Medications: Scheduled    . aspirin EC  81 mg Oral Daily  . atorvastatin  20 mg Oral q1800  . bisacodyl  10 mg Oral Daily   Or  . bisacodyl  10 mg Rectal Daily  . docusate sodium  200 mg Oral Daily  . furosemide  80 mg Oral Daily  . insulin aspart  0-24 Units Subcutaneous TID AC & HS  . levalbuterol  0.63 mg Nebulization BID  . metFORMIN  500 mg Oral BID WC  . metoprolol tartrate  25 mg Oral BID  . pantoprazole  40 mg Oral Q1200  . potassium chloride  30 mEq Oral Daily  . sodium chloride  10-40 mL Intracatheter Q12H  . sodium chloride  3 mL Intravenous Q12H  . warfarin  2.5 mg Oral q1800  . Warfarin - Physician Dosing Inpatient   Does not apply q1800  . DISCONTD: metoprolol  5 mg Intravenous Q6H  . DISCONTD: metoprolol tartrate  12.5 mg Oral BID     Radiology/Studies:  No results found.  INR: Will add last result for INR, ABG once components are confirmed Will add last 4 CBG results once components are confirmed  Assessment/Plan: S/P Procedure(s) (LRB): CORONARY ARTERY BYPASS GRAFTING (CABG) (N/A) AORTIC VALVE REPLACEMENT (AVR) (N/A) MYOMECTOMY (N/A)  1/ appears to be  stable for transfer to SNF. D/C sutures   LOS: 13 days    GOLD,[name] 4/3/20137:56 AM    I have seen and examined the patient and agree with the assessment and plan as outlined.  D/C today.  Zahriah Roes H 07/16/2011 8:40 AM

## 2011-07-16 NOTE — Progress Notes (Signed)
CARDIAC REHAB PHASE I   PRE:  Rate/Rhythm: 92SR  BP:  Supine:   Sitting: 140/70  Standing:    SaO2: 96%RA  MODE:  Ambulation: 350 ft   POST:  Rate/Rhythem: 105  BP:  Supine:   Sitting: 143/70  Standing:    SaO2: 93-94%RA 0958-1040 Pt walked 350 ft on RA with rolling walker and asst x 1. Tolerated well. To recliner after walk. Call bell in reach. Education completed. Permission given to refer to [facility] Phase 2. Encouraged walking with asst., IS, and flutter valve.  Jeani Sow

## 2011-07-16 NOTE — Discharge Summary (Signed)
I agree with the above discharge summary and plan for follow-up.  Estus Krakowski H  

## 2011-07-16 NOTE — Progress Notes (Signed)
PT Cancellation Note  Treatment cancelled today due to pt with d/c SNF orders for today. Will defer furhter PT to SNF. [name] HELEN 07/16/2011, 12:50 PM

## 2011-07-16 NOTE — Progress Notes (Signed)
MD, please sign FL2 on front of  d/c packet in [facility].   CSW cannot d/c pt to SNF without signed FL2.  Jerral Ralph, MSW [phone]

## 2011-07-16 NOTE — Progress Notes (Signed)
Pt's sutures removed from midsternal incision and chest tube site.  PA aware of small area not approximated about 1cm in length at bottom of incision. Scant amount of serosanguineous drainage from this area noted upon suture removal.  Betadine and adhesive strips applied to midsternal incision and chest tube site.  Pt tolerated suture removal well.  Assisted back to chair with chair alarm on and call bell within reach.

## 2011-07-16 NOTE — Progress Notes (Signed)
Patient refused walking this evening, stated that she is "not able" and that she "already walked twice."  I explained that we would like for her to walk three times.  Patient also noncompliant with using incentive spirometer.  Will continue to monitor and encourage.  Randell Patient

## 2011-07-17 NOTE — Progress Notes (Signed)
   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  PTA, PT LIVED ALONE.     Action/Plan:   PHYSICAL THERAPY RECOMMENDING SKILLED NURSING FACILITY PLACEMENT AT DISCHARGE.  WILL CONSULT CSW.   Anticipated DC Date:  07/12/2011   Anticipated DC Plan:  SKILLED NURSING FACILITY         Choice offered to / List presented to:             Status of service:  Completed, signed off Medicare Important Message given?   (If response is "NO", the following Medicare IM given date fields will be blank) Date Medicare IM given:   Date Additional Medicare IM given:    Discharge Disposition:  [place]  Per UR Regulation:    If discussed at Long Length of Stay Meetings, dates discussed:    Comments:  07/16/11 Wava Kildow,RN,BSN 1500 PT DISCHARGED TO SNF, PER CSW ARRANGEMENTS. Phone [phone]   07/07/11 Teckla Christiansen,RN,BSN CSW CONSULTED TO FACILITATE DC TO SNF FOR REHAB WHEN MEDICALLY STABLE. Phone [phone]

## 2011-07-30 ENCOUNTER — Other Ambulatory Visit: Payer: Self-pay | Admitting: Thoracic Surgery (Cardiothoracic Vascular Surgery)

## 2011-07-30 DIAGNOSIS — I2510 Atherosclerotic heart disease of native coronary artery without angina pectoris: Secondary | ICD-10-CM

## 2011-08-04 ENCOUNTER — Ambulatory Visit
Admission: RE | Admit: 2011-08-04 | Discharge: 2011-08-04 | Disposition: A | Discharge status: Home / Self Care | Payer: Medicare Other | Source: Ambulatory Visit | Attending: Thoracic Surgery (Cardiothoracic Vascular Surgery) | Admitting: Thoracic Surgery (Cardiothoracic Vascular Surgery)

## 2011-08-04 ENCOUNTER — Ambulatory Visit: Payer: Medicare Other | Admitting: Thoracic Surgery (Cardiothoracic Vascular Surgery)

## 2011-08-04 DIAGNOSIS — I2510 Atherosclerotic heart disease of native coronary artery without angina pectoris: Secondary | ICD-10-CM

## 2011-08-05 ENCOUNTER — Telehealth: Payer: Self-pay

## 2011-08-05 NOTE — Telephone Encounter (Signed)
Faxed over order to remove sutures and clean with soap and water, keep dry. Appt with Dr Roxy Manns on 08/08/11. S/P CABG/AVR

## 2011-08-08 ENCOUNTER — Encounter: Payer: Self-pay | Admitting: Thoracic Surgery (Cardiothoracic Vascular Surgery)

## 2011-08-08 ENCOUNTER — Ambulatory Visit (INDEPENDENT_AMBULATORY_CARE_PROVIDER_SITE_OTHER): Payer: Medicare Other | Admitting: Thoracic Surgery (Cardiothoracic Vascular Surgery)

## 2011-08-08 ENCOUNTER — Ambulatory Visit: Payer: Self-pay | Admitting: Thoracic Surgery (Cardiothoracic Vascular Surgery)

## 2011-08-08 VITALS — BP 123/80 | HR 76 | Resp 18 | Ht <= 58 in | Wt 160.0 lb

## 2011-08-08 DIAGNOSIS — Z954 Presence of other heart-valve replacement: Secondary | ICD-10-CM

## 2011-08-08 DIAGNOSIS — Z9889 Other specified postprocedural states: Secondary | ICD-10-CM | POA: Insufficient documentation

## 2011-08-08 DIAGNOSIS — Z952 Presence of prosthetic heart valve: Secondary | ICD-10-CM

## 2011-08-08 DIAGNOSIS — Z951 Presence of aortocoronary bypass graft: Secondary | ICD-10-CM

## 2011-08-08 NOTE — Patient Instructions (Signed)
The patient has been reminded to continue to avoid any heavy lifting or strenuous use of arms or shoulders for at least a total of three months from the time of surgery.  She has been strongly encouraged to make an effort to increase her physical activity, to work hard with the physical therapists and ultimately to consider enrollment an outpatient cardiac rehabilitation program.

## 2011-08-08 NOTE — Progress Notes (Signed)
[address]            [place] [address]          [phone]     CARDIOTHORACIC SURGERY OFFICE NOTE  Referring Provider is [name], MD PCP is Glenda Chroman., MD, MD   HPI:  Patient returns for routine followup status post aortic valve replacement, septal myomectomy, and coronary artery bypass grafting x4 on 07/04/2011. Her postoperative in-hospital convalescence was slow primarily because of the patient's extremely limited physical mobility and the underlying chronic medical conditions. She also had postoperative atrial fibrillation for which she was treated with amiodarone and Coumadin.  Amiodarone was ultimately stopped because of severe nausea and vomiting. She was discharged to a skilled nursing facility where she stayed 2 weeks. She was discharged from the nursing facility at and is now staying with her family. Home health physical therapy has been arranged and is in progress. She returns to the office for routine followup today. She has not yet been seen in followup by Dr. Burt Knack or any of his colleagues at the [facility] cardiology office.  The patient is a difficult historian but her sister provides excellent followup. The patient is doing fairly well. Her appetite is not great but acceptable. She has no shortness of breath. She has mild residual soreness in her chest. She has chronic severe bilateral lower extremity edema that was worse early after surgery and has been improving since hospital discharge. She has been sleeping on a couch as she is not comfortable laying flat in bed. She is walking a little bit in the house. Blood sugars have reportedly been under good control. Her prothrombin time has been checked and by report her INR was most recently 2.6.. Overall she is progressing reasonably well.   Current Outpatient Prescriptions  Medication Sig Dispense Refill  . aspirin EC 81 MG EC tablet Take 1 tablet (81 mg total) by mouth daily.  30  tablet  5  . atorvastatin (LIPITOR) 20 MG tablet Take 1 tablet (20 mg total) by mouth daily at 6 PM.  30 tablet  1  . bumetanide (BUMEX) 1 MG tablet Take 1 mg by mouth daily.       . ferrous sulfate 325 (65 FE) MG tablet Take 325 mg by mouth daily with breakfast.      . glipiZIDE (GLUCOTROL XL) 10 MG 24 hr tablet Take 10 mg by mouth 2 (two) times daily.      . metFORMIN (GLUCOPHAGE) 1000 MG tablet Take 500 mg by mouth daily.       . metoprolol tartrate (LOPRESSOR) 25 MG tablet Take 1 tablet (25 mg total) by mouth 2 (two) times daily.  60 tablet  1  . pantoprazole (PROTONIX) 40 MG tablet Take 1 tablet (40 mg total) by mouth daily at 12 noon.  30 tablet  1  . potassium chloride (K-DUR,KLOR-CON) 10 MEQ tablet Take 10 mEq by mouth daily.      . raloxifene (EVISTA) 60 MG tablet Take 60 mg by mouth daily.      . vitamin C (ASCORBIC ACID) 500 MG tablet Take 500 mg by mouth daily.      . Vitamin D, Ergocalciferol, (DRISDOL) 50000 UNITS CAPS Take 50,000 Units by mouth every 7 (seven) days. mondays      . warfarin (COUMADIN) 2.5 MG tablet Take 1 tablet (2.5 mg total) by mouth daily  at 6 PM.  30 tablet  3      Physical Exam:   BP 123/80  Pulse 76  Resp 18  Ht 4\' 10"  (1.473 m)  Wt 160 lb (72.576 kg)  BMI 33.44 kg/m2  SpO2 96%  General:  Obese but well-appearing  Chest:   Clear to auscultation with symmetrical breath sounds  CV:   Regular rate and rhythm  Incisions:  Clean and dry and healing nicely  Abdomen:  Soft and nontender  Extremities:  Warm and well-perfused with moderate to severe bilateral lower extremity edema, right greater than left. This is improved. There is skin changes of chronic venous insufficiency which are unchanged.   Diagnostic Tests:  *RADIOLOGY REPORT*  Clinical Data: Chest pain and cough.  CHEST - 2 VIEW 08/04/2011 Comparison: 07/09/2011.  Findings: Stable surgical changes from bypass surgery and valve  replacement surgery. The heart is mildly enlarged but stable.   There are small bilateral pleural effusions and streaky areas of  subsegmental atelectasis. No pulmonary edema or pleural effusion.  The bony thorax is intact.  IMPRESSION:  1. Stable surgical changes from prior surgery.  2. Very small bilateral pleural effusions and minimal areas of  atelectasis.  Original Report Authenticated By: P. Kalman Jewels, M.D.    Impression:  Patient is doing exceptionally well under the circumstances just over one month following aortic valve replacement, septal myomectomy, and coronary artery bypass grafting.   Plan:  I've encouraged patient to make an effort to increase her physical activity with her only limitation remaining that she refrain from heavy lifting or strenuous use of arms or shoulders. We have not made any changes in her current medications. They have been reminded to schedule an appointment to see Dr. Burt Knack or one of his colleagues it [facility] cardiology office in the near future for routine followup. We will plan to see her back in 2 months to make sure that she continues to recover uneventfully.    Valentina Gu. Roxy Manns, MD 08/08/2011 12:53 PM

## 2011-08-11 ENCOUNTER — Ambulatory Visit: Payer: Self-pay

## 2011-08-14 ENCOUNTER — Encounter: Payer: Self-pay | Admitting: Cardiology

## 2011-08-14 ENCOUNTER — Ambulatory Visit (INDEPENDENT_AMBULATORY_CARE_PROVIDER_SITE_OTHER): Payer: Medicare Other | Admitting: Cardiology

## 2011-08-14 VITALS — BP 105/68 | HR 85 | Ht <= 58 in | Wt 158.0 lb

## 2011-08-14 DIAGNOSIS — I4891 Unspecified atrial fibrillation: Secondary | ICD-10-CM

## 2011-08-14 DIAGNOSIS — Z9889 Other specified postprocedural states: Secondary | ICD-10-CM

## 2011-08-14 DIAGNOSIS — Z951 Presence of aortocoronary bypass graft: Secondary | ICD-10-CM

## 2011-08-14 DIAGNOSIS — I9789 Other postprocedural complications and disorders of the circulatory system, not elsewhere classified: Secondary | ICD-10-CM

## 2011-08-14 DIAGNOSIS — D649 Anemia, unspecified: Secondary | ICD-10-CM

## 2011-08-14 DIAGNOSIS — E785 Hyperlipidemia, unspecified: Secondary | ICD-10-CM

## 2011-08-14 DIAGNOSIS — Z7901 Long term (current) use of anticoagulants: Secondary | ICD-10-CM

## 2011-08-14 NOTE — Patient Instructions (Signed)
Continue all current medications. Follow up in  3 months 

## 2011-08-17 ENCOUNTER — Encounter: Payer: Self-pay | Admitting: Cardiology

## 2011-08-17 DIAGNOSIS — Z7901 Long term (current) use of anticoagulants: Secondary | ICD-10-CM | POA: Insufficient documentation

## 2011-08-17 DIAGNOSIS — I4891 Unspecified atrial fibrillation: Secondary | ICD-10-CM | POA: Insufficient documentation

## 2011-08-17 NOTE — Assessment & Plan Note (Signed)
The patient remains on Coumadin and reports no complications with bleeding.  She recently had a CBC done.  I presume the plan is to continue Coumadin for 3 months.  The patient had postoperative atrial fibrillation but remained in normal sinus rhythm.  She is off amiodarone and she is intolerant to this due to severe nausea.  EKG today demonstrates normal sinus rhythm.  The patient is followed in the Coumadin clinic.

## 2011-08-17 NOTE — Assessment & Plan Note (Signed)
The patient reports no recurrent substernal chest pain.  She initially presented with a non-ST elevation myocardial infarction and was found to have severe multivessel coronary artery disease.  Her sternotomy scar is well-healed.  There is no drainage or evidence of infection or instability of the sternum.

## 2011-08-17 NOTE — Assessment & Plan Note (Signed)
No evidence of rhythm abnormalities.  Myomectomy performed secondary to moderate to severe left ventricular hypertrophy with significant diastolic dysfunction.  In addition the patient had a small aortic root.

## 2011-08-17 NOTE — Assessment & Plan Note (Signed)
On statin drug therapy.

## 2011-08-17 NOTE — Assessment & Plan Note (Signed)
Hemoglobin stable 

## 2011-08-17 NOTE — Progress Notes (Signed)
Melinda Real, MD, [facility] ABIM Board Certified in Adult Cardiovascular Medicine,Internal Medicine and Critical Care Medicine    CC: Followup after recent coronary bypass surgery, myectomy and aortic valve replacement with bioprosthesis.  HPI:  The patient was initially seen by Dr.Owen. She has done well postoperatively.  However her post operative in hospital convalescence was slow because of the patient's extremely limited physical mobility and her underlying medical conditions.  In addition the patient also has significant dementia.  She had postoperative atrial fibrillation but remains in normal sinus rhythm.  She reports no chest pain shortness of breath orthopnea or PND.  She did point to work in incision site of the saphenous vein graft harvesting site where there is a retained suture.  I told her not to scratch this area and the wound looks clean and dry. She reports also no shortness of breath.  Coumadin therapy is being followed.  She has no orthopnea or PND.  She has much improved lower extremity edema.  She has not a followup appointment scheduled with her cardiothoracic surgeon for presumed in next couple of weeks. The patient now resides back home with her daughter who works in [employer].  PMH: reviewed and listed in Problem List in Electronic Records (and see below) Past Medical History  Diagnosis Date  . Heart murmur   . Angina   . Hypertension   . Diabetes mellitus   . Peripheral vascular disease     per family  . Shortness of breath     Normal LV systolic function.  . Anemia     hx of anemia  . Cancer     rectal cancer history  . GERD (gastroesophageal reflux disease)   . Gout     hx of   . Anxiety   . Developmental delay   . 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 S/P AVR (aortic valve replacement) 07/05/2011    19 mm [facility] Ease pericardial tissue valve with septal myomectomy  . Status post myomectomy 07/05/2011  . Postoperative atrial fibrillation     Initially on amiodarone but discontinued due to severe nausea.  . Warfarin anticoagulation    Past Surgical History  Procedure Date  . Abdominal hysterectomy   . Colonoscopy w/ biopsies and polypectomy   . Low anterior bowel resection 2011    rectal cancer  . Coronary artery bypass graft 07/04/2011    Procedure: CORONARY ARTERY BYPASS GRAFTING (CABG);  Surgeon: Rexene Alberts, MD;  Location: [place];  Service: Open Heart Surgery;  Laterality: N/A;  four grafts using bilateral leg greater saphenous vein harvested endoscopically.  . Aortic valve replacement 07/04/2011    Procedure: AORTIC VALVE REPLACEMENT (AVR);  Surgeon: Rexene Alberts, MD;  Location: [place];  Service: Open Heart Surgery;  Laterality: N/A;  . Myomectomy 07/04/2011    Procedure: MYOMECTOMY;  Surgeon: Rexene Alberts, MD;  Location: [place];  Service: Open Heart Surgery;  Laterality: N/A;  septal    Allergies/SH/FHX : available in Electronic Records for review  Allergies  Allergen Reactions  . Amiodarone Nausea And Vomiting    Severe nausea   History   Social History  . Marital Status: Divorced    Spouse Name: N/A    Number  of Children: N/A  . Years of Education: N/A   Occupational History  . Not on file.   Social History Main Topics  . Smoking status: Former Smoker -- 1.0 packs/day for 20 years    Types: Cigarettes    Quit date: 04/14/1989  . Smokeless tobacco: Never Used   Comment: Quit > 20 years ago  . Alcohol Use: No  . Drug Use: No  . Sexually Active: No   Other Topics Concern  . Not on file   Social History Narrative   Pt lives alone and family checks on her daily.    No family history on file.  Medications: Current Outpatient Prescriptions  Medication Sig Dispense Refill  . aspirin EC  81 MG EC tablet Take 1 tablet (81 mg total) by mouth daily.  30 tablet  5  . atorvastatin (LIPITOR) 20 MG tablet Take 1 tablet (20 mg total) by mouth daily at 6 PM.  30 tablet  1  . bumetanide (BUMEX) 1 MG tablet Take 1 mg by mouth daily.       . ferrous sulfate 325 (65 FE) MG tablet Take 325 mg by mouth daily with breakfast.      . glipiZIDE (GLUCOTROL XL) 10 MG 24 hr tablet Take 10 mg by mouth 2 (two) times daily.      . metFORMIN (GLUCOPHAGE-XR) 500 MG 24 hr tablet Take 500 mg by mouth daily with breakfast.      . metoprolol tartrate (LOPRESSOR) 25 MG tablet Take 1 tablet (25 mg total) by mouth 2 (two) times daily.  60 tablet  1  . potassium chloride (K-DUR,KLOR-CON) 10 MEQ tablet Take 10 mEq by mouth daily.      . raloxifene (EVISTA) 60 MG tablet Take 60 mg by mouth daily.      . traMADol (ULTRAM) 50 MG tablet Take 50 mg by mouth 2 (two) times daily as needed.      . Vitamin D, Ergocalciferol, (DRISDOL) 50000 UNITS CAPS Take 50,000 Units by mouth every 7 (seven) days. mondays      . warfarin (COUMADIN) 1 MG tablet Take 1 mg by mouth as directed.      . warfarin (COUMADIN) 2 MG tablet Take 2 mg by mouth as directed.      . vitamin C (ASCORBIC ACID) 500 MG tablet Take 500 mg by mouth daily.        ROS: No nausea or vomiting. No fever or chills.No melena or hematochezia.No bleeding.No claudication  Physical Exam: BP 105/68  Pulse 85  Ht 4\' 10"  (1.473 m)  Wt 158 lb (71.668 kg)  BMI 33.02 kg/m2  SpO2 95% General:Well-nourished white female with dementia. Neck:Normal carotid upstroke and no carotid bruits.  No thyromegaly no nodular thyroid.  JVP is 5-6 cm Lungs:Clear breath sounds bilaterally without wheezing. Chest: Well-healed sternotomy scar. Cardiac:Regular rate and rhythm with normal S1 and S2 and no murmur rubs or gallops. Vascular:No edema.Normal distal pulses Skin:Warm and dry Physcologic:Normal affect  12lead [id number] sinus rhythm with no acute ischemic changes or prior  infarct patterns. Limited bedside ECHO:N/A No images are attached to the encounter.   Assessment and Plan  Warfarin anticoagulation The patient remains on Coumadin and reports no complications with bleeding.  She recently had a CBC done.  I presume the plan is to continue Coumadin for 3 months.  The patient had postoperative atrial fibrillation but remained in normal sinus rhythm.  She is off amiodarone and she is intolerant to this due to  severe nausea.  EKG today demonstrates normal sinus rhythm.  The patient is followed in the Coumadin clinic.  S/P myomectomy No evidence of rhythm abnormalities.  Myomectomy performed secondary to moderate to severe left ventricular hypertrophy with significant diastolic dysfunction.  In addition the patient had a small aortic root.  S/P CABG x 4 The patient reports no recurrent substernal chest pain.  She initially presented with a non-ST elevation myocardial infarction and was found to have severe multivessel coronary artery disease.  Her sternotomy scar is well-healed.  There is no drainage or evidence of infection or instability of the sternum.  Dyslipidemia On statin drug therapy.  Anemia Hemoglobin stable.  Status post aVR With bioprosthesis I recommended a followup echocardiogram which will be done in the primary care physician's office because her daughter works there.  We will ask to have the results forwarded to [place].    Patient Active Problem List  Diagnoses  . NSTEMI (non-ST elevated myocardial infarction)  . DM (diabetes mellitus)  . Dyslipidemia  . Anemia  . S/P CABG x 4  . S/P AVR (aortic valve replacement)  . S/P myomectomy  . Warfarin anticoagulation  . Postoperative atrial fibrillation

## 2011-08-19 ENCOUNTER — Encounter: Payer: Medicare Other | Admitting: Physician Assistant

## 2011-08-22 ENCOUNTER — Other Ambulatory Visit: Payer: Self-pay | Admitting: Internal Medicine

## 2011-08-25 ENCOUNTER — Encounter: Payer: Self-pay | Admitting: Internal Medicine

## 2011-08-26 ENCOUNTER — Encounter: Payer: Medicare Other | Admitting: Internal Medicine

## 2011-08-26 DIAGNOSIS — I10 Essential (primary) hypertension: Secondary | ICD-10-CM

## 2011-08-26 DIAGNOSIS — I2510 Atherosclerotic heart disease of native coronary artery without angina pectoris: Secondary | ICD-10-CM

## 2011-08-26 DIAGNOSIS — C20 Malignant neoplasm of rectum: Secondary | ICD-10-CM

## 2011-10-06 ENCOUNTER — Ambulatory Visit (INDEPENDENT_AMBULATORY_CARE_PROVIDER_SITE_OTHER): Payer: Medicare Other | Admitting: Thoracic Surgery (Cardiothoracic Vascular Surgery)

## 2011-10-06 ENCOUNTER — Encounter: Payer: Self-pay | Admitting: Thoracic Surgery (Cardiothoracic Vascular Surgery)

## 2011-10-06 VITALS — BP 129/73 | HR 71 | Resp 20 | Ht <= 58 in | Wt 164.0 lb

## 2011-10-06 DIAGNOSIS — Z9889 Other specified postprocedural states: Secondary | ICD-10-CM

## 2011-10-06 DIAGNOSIS — Z954 Presence of other heart-valve replacement: Secondary | ICD-10-CM

## 2011-10-06 DIAGNOSIS — Z951 Presence of aortocoronary bypass graft: Secondary | ICD-10-CM

## 2011-10-06 DIAGNOSIS — I2510 Atherosclerotic heart disease of native coronary artery without angina pectoris: Secondary | ICD-10-CM

## 2011-10-06 DIAGNOSIS — Z952 Presence of prosthetic heart valve: Secondary | ICD-10-CM

## 2011-10-06 NOTE — Progress Notes (Signed)
[address]            [place] [address]          [phone]     CARDIOTHORACIC SURGERY OFFICE NOTE  Referring Provider is [name], MD PCP is VYAS,DHRUV B., MD   HPI:  Patient returns for routine followup approximately 3 months status post aortic valve replacement, coronary artery bypass grafting, and septal myomectomy. She was last seen here in the office on 08/08/2011. Since then the patient has remained clinically stable. She is brought in to the office by her sister. The patient lives a very sedentary lifestyle. She reports stable exertional shortness of breath and bilateral lower extremity edema. She's not had any acute exacerbations of congestive heart failure. She is eating well and sleeping well. Overall she has no other specific complaints.   Current Outpatient Prescriptions  Medication Sig Dispense Refill  . aspirin EC 81 MG EC tablet Take 1 tablet (81 mg total) by mouth daily.  30 tablet  5  . atorvastatin (LIPITOR) 20 MG tablet Take 1 tablet (20 mg total) by mouth daily at 6 PM.  30 tablet  1  . bumetanide (BUMEX) 1 MG tablet Take 1 mg by mouth daily.       Marland Kitchen glipiZIDE (GLUCOTROL XL) 10 MG 24 hr tablet Take 10 mg by mouth 2 (two) times daily.      . metFORMIN (GLUCOPHAGE-XR) 500 MG 24 hr tablet Take 500 mg by mouth daily with breakfast.      . metoprolol tartrate (LOPRESSOR) 25 MG tablet Take 1 tablet (25 mg total) by mouth 2 (two) times daily.  60 tablet  1  . potassium chloride (K-DUR,KLOR-CON) 10 MEQ tablet Take 10 mEq by mouth daily.      . raloxifene (EVISTA) 60 MG tablet Take 60 mg by mouth daily.      . traMADol (ULTRAM) 50 MG tablet Take 50 mg by mouth 2 (two) times daily as needed.      . vitamin C (ASCORBIC ACID) 500 MG tablet Take 500 mg by mouth daily.      . Vitamin D, Ergocalciferol, (DRISDOL) 50000 UNITS CAPS Take 50,000 Units by mouth every 7 (seven) days. mondays      . warfarin (COUMADIN) 2 MG tablet Take 2 mg  by mouth daily. Except on Wednesday, 4 mg on Wednesday      . warfarin (COUMADIN) 4 MG tablet Take 4 mg by mouth once. Only on Wednesdays          Physical Exam:   BP 129/73  Pulse 71  Resp 20  Ht 4\' 9"  (1.448 m)  Wt 164 lb (74.39 kg)  BMI 35.49 kg/m2  SpO2 96%  General:  Obese female in no distress  Chest:   Symmetrical breath sounds without rales  CV:   Regular rate and rhythm  Incisions:  Completely healed  Abdomen:  Soft and nontender  Extremities:  Moderate bilateral lower extremity edema  Diagnostic Tests:  n/a   Impression:  Patient remains clinically stable 3 months following aortic valve replacement, coronary artery bypass grafting, and septal myomectomy. She has not yet had a followup echocardiogram performed but she is scheduled to return to see Dr Dannielle Burn in August.  Plan:  The future the patient will call and return to see [place] as needed. All of her questions been addressed.   Valentina Gu.  Roxy Manns, MD 10/06/2011 10:50 AM

## 2011-10-08 ENCOUNTER — Other Ambulatory Visit: Payer: Self-pay | Admitting: *Deleted

## 2011-10-08 DIAGNOSIS — Z952 Presence of prosthetic heart valve: Secondary | ICD-10-CM

## 2011-11-19 ENCOUNTER — Other Ambulatory Visit (INDEPENDENT_AMBULATORY_CARE_PROVIDER_SITE_OTHER): Payer: Medicare Other

## 2011-11-19 ENCOUNTER — Other Ambulatory Visit: Payer: Self-pay

## 2011-11-19 DIAGNOSIS — I359 Nonrheumatic aortic valve disorder, unspecified: Secondary | ICD-10-CM

## 2011-11-19 DIAGNOSIS — I4891 Unspecified atrial fibrillation: Secondary | ICD-10-CM

## 2011-11-19 DIAGNOSIS — Z952 Presence of prosthetic heart valve: Secondary | ICD-10-CM

## 2011-11-21 ENCOUNTER — Encounter: Payer: Self-pay | Admitting: *Deleted

## 2011-11-27 ENCOUNTER — Encounter: Payer: Self-pay | Admitting: Cardiology

## 2011-11-27 ENCOUNTER — Ambulatory Visit (INDEPENDENT_AMBULATORY_CARE_PROVIDER_SITE_OTHER): Payer: Medicare Other | Admitting: Cardiology

## 2011-11-27 VITALS — BP 120/73 | HR 73 | Ht <= 58 in | Wt 166.0 lb

## 2011-11-27 DIAGNOSIS — Z952 Presence of prosthetic heart valve: Secondary | ICD-10-CM

## 2011-11-27 DIAGNOSIS — Z9889 Other specified postprocedural states: Secondary | ICD-10-CM

## 2011-11-27 DIAGNOSIS — D649 Anemia, unspecified: Secondary | ICD-10-CM

## 2011-11-27 DIAGNOSIS — Z7901 Long term (current) use of anticoagulants: Secondary | ICD-10-CM

## 2011-11-27 DIAGNOSIS — I4891 Unspecified atrial fibrillation: Secondary | ICD-10-CM

## 2011-11-27 DIAGNOSIS — Z954 Presence of other heart-valve replacement: Secondary | ICD-10-CM

## 2011-11-27 NOTE — Assessment & Plan Note (Signed)
No recurrent shortness of breath. No subvalvular gradient on echocardiogram.

## 2011-11-27 NOTE — Assessment & Plan Note (Addendum)
Coumadin discontinued today. Blood work followed by primary care physician. Most recent hemoglobin 10.3

## 2011-11-27 NOTE — Progress Notes (Signed)
Melinda Real, MD, [facility] ABIM Board Certified in Adult Cardiovascular Medicine,Internal Medicine and Critical Care Medicine    CC:    Follow patient with aortic valve replacement, myomectomy and bypass surgery                                                                              HPI:       Patient is doing well. She reports no chest pain or shortness of breath. She has underlying dementia which has not worsened after open-heart surgery. She'll followup echocardiogram which showed a well-seated valve the peak velocity of 2.5 m/s and mean gradient of 14 mm of mercury. The patient denies any orthopnea or PND palpitations or syncope. She reports no cardiovascular complaints. She is ambulatory although she's not very active. She has been discharged from the cardiothoracic surgery practice. She otherwise had an uneventful postoperative course.  PMH: reviewed and listed in Problem List in Electronic Records (and see below) Past Medical History  Diagnosis Date  . Heart murmur   . Angina   . Hypertension   . Diabetes mellitus   . Peripheral vascular disease     per family  . Shortness of breath     Normal LV systolic function.  . Anemia     hx of anemia  . Cancer     rectal cancer history  . GERD (gastroesophageal reflux disease)   . Gout     hx of   . Anxiety   . Developmental delay   . 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 S/P AVR (aortic valve replacement) 07/05/2011    19 mm [facility] Ease pericardial tissue valve with septal myomectomy  . Status post myomectomy 07/05/2011  . Postoperative atrial fibrillation     Initially on amiodarone but discontinued due to severe nausea.  . Warfarin anticoagulation    Past Surgical History  Procedure Date  . Abdominal  hysterectomy   . Colonoscopy w/ biopsies and polypectomy   . Low anterior bowel resection 2011    rectal cancer  . Coronary artery bypass graft 07/04/2011    Procedure: CORONARY ARTERY BYPASS GRAFTING (CABG);  Surgeon: Rexene Alberts, MD;  Location: [place];  Service: Open Heart Surgery;  Laterality: N/A;  four grafts using bilateral leg greater saphenous vein harvested endoscopically.  . Aortic valve replacement 07/04/2011    Procedure: AORTIC VALVE REPLACEMENT (AVR);  Surgeon: Rexene Alberts, MD;  Location: [place];  Service: Open Heart Surgery;  Laterality: N/A;  . Myomectomy 07/04/2011    Procedure: MYOMECTOMY;  Surgeon: Rexene Alberts, MD;  Location: [place];  Service: Open Heart Surgery;  Laterality: N/A;  septal    Allergies/SH/FHX : available in Electronic Records for review  Allergies  Allergen Reactions  . Amiodarone Nausea And Vomiting    Severe nausea   History   Social History  . Marital Status: Divorced    Spouse Name: N/A    Number of  Children: N/A  . Years of Education: N/A   Occupational History  . Not on file.   Social History Main Topics  . Smoking status: Former Smoker -- 1.0 packs/day for 20 years    Types: Cigarettes    Quit date: 04/14/1989  . Smokeless tobacco: Never Used   Comment: Quit > 20 years ago  . Alcohol Use: No  . Drug Use: No  . Sexually Active: No   Other Topics Concern  . Not on file   Social History Narrative   Pt lives alone and family checks on her daily.    No family history on file.  Medications: Current Outpatient Prescriptions  Medication Sig Dispense Refill  . aspirin EC 81 MG EC tablet Take 1 tablet (81 mg total) by mouth daily.  30 tablet  5  . atorvastatin (LIPITOR) 20 MG tablet Take 1 tablet (20 mg total) by mouth daily at 6 PM.  30 tablet  1  . bumetanide (BUMEX) 1 MG tablet Take 1 mg by mouth daily.       Marland Kitchen glipiZIDE (GLUCOTROL XL) 10 MG 24 hr tablet Take 10 mg by mouth 2 (two) times daily.      Marland Kitchen levothyroxine  (SYNTHROID, LEVOTHROID) 25 MCG tablet Take 1 tablet by mouth Daily.      . metFORMIN (GLUCOPHAGE-XR) 500 MG 24 hr tablet Take 500 mg by mouth daily with breakfast.      . metoprolol tartrate (LOPRESSOR) 25 MG tablet Take 1 tablet (25 mg total) by mouth 2 (two) times daily.  60 tablet  1  . potassium chloride (K-DUR,KLOR-CON) 10 MEQ tablet Take 10 mEq by mouth daily.      . raloxifene (EVISTA) 60 MG tablet Take 60 mg by mouth daily.      . traMADol (ULTRAM) 50 MG tablet Take 50 mg by mouth 2 (two) times daily as needed.      . vitamin C (ASCORBIC ACID) 500 MG tablet Take 500 mg by mouth daily.      . Vitamin D, Ergocalciferol, (DRISDOL) 50000 UNITS CAPS Take 50,000 Units by mouth every 7 (seven) days. mondays      . warfarin (COUMADIN) 2 MG tablet Take 2 mg by mouth daily. Except on Wednesday, 4 mg on Wednesday        ROS: No nausea or vomiting. No fever or chills.No melena or hematochezia.No bleeding.No claudication  Physical Exam: BP 120/73  Pulse 73  Ht 4\' 9"  (1.448 m)  Wt 166 lb (75.297 kg)  BMI 35.92 kg/m2  SpO2 95% General: Well-nourished white female in no distress her dementia Neck: Normal carotid upstroke no carotid bruits. No thyromegaly nonnodular thyroid. JVP is 6-7 cm Lungs: Clear breath sounds bilaterally without wheezing. Cardiac: Regular rhythm with 2/6 crescendo decrescendo murmur no S3 or S4 Vascular: No edema. Normal distal pulses. Skin: Warm and dry Physcologic: Normal affect reviewed EKG from last office visit normal sinus rhythm  12lead ECG: Limited bedside ECHO:N/A No images are attached to the encounter.   I reviewed and summarized the old records. I reviewed ECG and prior blood work.  Assessment and Plan  Anemia Coumadin discontinued today. Blood work followed by primary care physician. Most recent hemoglobin 10.3  S/P AVR (aortic valve replacement) Soft systolic murmur on exam. Normal for valve disease and 19 mm valve. The patient denies any chest  pain or shortness of breath. Echocardiogram shows a well-seated valve  Postoperative atrial fibrillation Patient has now remained for several months in normal  sinus rhythm. Could not tolerate amiodarone listed as allergy. Warfarin was discontinued today.  S/P myomectomy No recurrent shortness of breath. No subvalvular gradient on echocardiogram.  Warfarin anticoagulation Discontinued    Patient Active Problem List  Diagnosis  . NSTEMI (non-ST elevated myocardial infarction)  . DM (diabetes mellitus)  . Dyslipidemia  . Anemia  . S/P CABG x 4  . S/P AVR (aortic valve replacement)  . S/P myomectomy  . Warfarin anticoagulation  . Postoperative atrial fibrillation

## 2011-11-27 NOTE — Patient Instructions (Addendum)
Stop taking coumadin.  Your physician wants you to follow-up in: 6 months with Dr. Lutricia Feil.  You will receive a reminder letter in the mail two months in advance. If you don't receive a letter, please call our office to schedule the follow-up appointment.

## 2011-11-27 NOTE — Assessment & Plan Note (Signed)
Discontinued

## 2011-11-27 NOTE — Assessment & Plan Note (Signed)
Patient has now remained for several months in normal sinus rhythm. Could not tolerate amiodarone listed as allergy. Warfarin was discontinued today.

## 2011-11-27 NOTE — Assessment & Plan Note (Signed)
Soft systolic murmur on exam. Normal for valve disease and 19 mm valve. The patient denies any chest pain or shortness of breath. Echocardiogram shows a well-seated valve

## 2012-04-30 ENCOUNTER — Encounter: Payer: Medicare Other | Admitting: Internal Medicine

## 2012-04-30 DIAGNOSIS — C20 Malignant neoplasm of rectum: Secondary | ICD-10-CM

## 2012-04-30 DIAGNOSIS — F0390 Unspecified dementia without behavioral disturbance: Secondary | ICD-10-CM

## 2012-05-20 ENCOUNTER — Encounter: Payer: Self-pay | Admitting: Cardiology

## 2012-05-20 ENCOUNTER — Ambulatory Visit (INDEPENDENT_AMBULATORY_CARE_PROVIDER_SITE_OTHER): Payer: Medicare Other | Admitting: Cardiology

## 2012-05-20 VITALS — BP 158/77 | HR 97 | Ht 59.0 in | Wt 171.0 lb

## 2012-05-20 DIAGNOSIS — I2510 Atherosclerotic heart disease of native coronary artery without angina pectoris: Secondary | ICD-10-CM | POA: Insufficient documentation

## 2012-05-20 DIAGNOSIS — I519 Heart disease, unspecified: Secondary | ICD-10-CM

## 2012-05-20 DIAGNOSIS — Z954 Presence of other heart-valve replacement: Secondary | ICD-10-CM

## 2012-05-20 DIAGNOSIS — I4891 Unspecified atrial fibrillation: Secondary | ICD-10-CM

## 2012-05-20 DIAGNOSIS — Z952 Presence of prosthetic heart valve: Secondary | ICD-10-CM

## 2012-05-20 DIAGNOSIS — E785 Hyperlipidemia, unspecified: Secondary | ICD-10-CM

## 2012-05-20 NOTE — Patient Instructions (Signed)
Continue all current medications. Your physician wants you to follow up in: 6 months.  You will receive a reminder letter in the mail one-two months in advance.  If you don't receive a letter, please call our office to schedule the follow up appointment   

## 2012-05-20 NOTE — Assessment & Plan Note (Signed)
Due for followup lab work with Dr. Woody Seller.

## 2012-05-20 NOTE — Assessment & Plan Note (Signed)
Stable by echocardiogram last summer.

## 2012-05-20 NOTE — Progress Notes (Signed)
Clinical Summary Ms. Doddridge is a medically complex 67 y.o.female presenting for followup. She is a former patient of Dr. Dannielle Burn, last seen in August 2013. She is here with a family member. In general, has been stable. Does have mild to moderate dyspnea on exertion, no chest pain or palpitations. She lives in a mobile home, does not drive. Family helps to take care of her.  Echocardiogram in August 2013 showed moderate LVH with LVEF 55-60%, stable AV prosthesis, mean gradient 14 mmHg.  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 She has had no obvious recurrence of atrial fibrillation. Was taken off Coumadin at her last visit.  I reviewed her medications. She has reportedly been compliant. She will be following up with Dr. Woody Seller soon for a routine visit and lab work.   Allergies  Allergen Reactions  . Amiodarone Nausea And Vomiting    Severe nausea    Current Outpatient Prescriptions  Medication Sig Dispense Refill  . aspirin EC 81 MG EC tablet Take 1 tablet (81 mg total) by mouth daily.  30 tablet  5  . atorvastatin (LIPITOR) 20 MG tablet Take 1 tablet (20 mg total) by mouth daily at 6 PM.  30 tablet  1  . bumetanide (BUMEX) 1 MG tablet Take 1 mg by mouth daily.       Marland Kitchen glipiZIDE (GLUCOTROL XL) 10 MG 24 hr tablet Take 10 mg by mouth 2 (two) times daily.      Marland Kitchen levothyroxine (SYNTHROID, LEVOTHROID) 25 MCG tablet Take 1 tablet by mouth Daily.      . metFORMIN (GLUCOPHAGE-XR) 500 MG 24 hr tablet Take 500 mg by mouth daily with breakfast.      . metoprolol tartrate (LOPRESSOR) 25 MG tablet Take 1 tablet (25 mg total) by mouth 2 (two) times daily.  60 tablet  1  . potassium chloride (K-DUR,KLOR-CON) 10 MEQ tablet Take 10 mEq by mouth daily.      . raloxifene (EVISTA) 60 MG tablet Take 60 mg by mouth daily.      . vitamin C (ASCORBIC ACID) 500 MG tablet Take 500 mg by mouth daily.      . Vitamin D, Ergocalciferol, (DRISDOL) 50000 UNITS CAPS Take 50,000 Units by mouth  every 7 (seven) days. mondays        Past Medical History  Diagnosis Date  . Essential hypertension, benign   . Type 2 diabetes mellitus   . Peripheral vascular disease   . History of anemia   . Rectal cancer   . GERD (gastroesophageal reflux disease)   . Gout   . Anxiety   . Developmental delay   . 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 S/P AVR (aortic valve replacement) 07/05/2011    19 mm [facility] Ease pericardial tissue valve with septal myomectomy  . Status post myomectomy 07/05/2011  . Postoperative atrial fibrillation     Initially on amiodarone but discontinued due to severe nausea.    Social History Ms. Stehlin reports that she quit smoking about 23 years ago. Her smoking use included Cigarettes. She has a 20 pack-year smoking history. She has never used smokeless tobacco. Ms. Harth reports that she does not drink alcohol.  Review of Systems Chronic leg edema. No orthopnea or PND. No falls or syncope.  Physical Examination Filed Vitals:   05/20/12 1510  BP: 158/77  Pulse: 97   Filed Weights  05/20/12 1510  Weight: 171 lb (77.565 kg)   Overweight woman in no acute distress HEENT: Conjunctiva and lids normal, oropharynx clear. Neck: Supple, no elevated JVP or carotid bruits, no thyromegaly. Lungs: Clear to auscultation, nonlabored breathing at rest. Cardiac: Regular rate and rhythm, no S3, 2/6 systolic murmur, no pericardial rub. Abdomen: Soft, nontender, bowel sounds present. Extremities: Chronic appearing edema.   Problem List and Plan   Coronary atherosclerosis of native coronary artery No active angina, status post CABG in March 2013, LVEF normal. Continue medical therapy. Could consider advancing beta blocker dosing for better heart rate and blood pressure control.  Postoperative atrial fibrillation Maintaining sinus rhythm. This has not been recurrent. Was taken off Coumadin at her last visit.  S/P AVR  (aortic valve replacement) Stable by echocardiogram last summer.  Dyslipidemia Due for followup lab work with Dr. Woody Seller.    Satira Sark, M.D., F.A.C.C.

## 2012-05-20 NOTE — Assessment & Plan Note (Signed)
No active angina, status post CABG in March 2013, LVEF normal. Continue medical therapy. Could consider advancing beta blocker dosing for better heart rate and blood pressure control.

## 2012-05-20 NOTE — Assessment & Plan Note (Signed)
Maintaining sinus rhythm. This has not been recurrent. Was taken off Coumadin at her last visit.

## 2013-04-22 ENCOUNTER — Ambulatory Visit (INDEPENDENT_AMBULATORY_CARE_PROVIDER_SITE_OTHER): Payer: Medicare Other | Admitting: Cardiology

## 2013-04-22 ENCOUNTER — Encounter: Payer: Self-pay | Admitting: Cardiology

## 2013-04-22 VITALS — BP 124/74 | HR 91 | Ht <= 58 in | Wt 171.0 lb

## 2013-04-22 DIAGNOSIS — E785 Hyperlipidemia, unspecified: Secondary | ICD-10-CM

## 2013-04-22 DIAGNOSIS — Z954 Presence of other heart-valve replacement: Secondary | ICD-10-CM

## 2013-04-22 DIAGNOSIS — Z952 Presence of prosthetic heart valve: Secondary | ICD-10-CM

## 2013-04-22 DIAGNOSIS — I2510 Atherosclerotic heart disease of native coronary artery without angina pectoris: Secondary | ICD-10-CM

## 2013-04-22 NOTE — Progress Notes (Signed)
Clinical Summary Ms. Barkin is a 68 y.o.female last seen in February 2014. She is here with a family member. Remains fairly sedentary, denies any chest pain or shortness of breath with her limited activities. She reports compliance with her medications, has had followup with Dr. Woody Seller.  Lab work from September 2014 showed TSH 3.0, hemoglobin A1c 9.0. Lipids are followed by Dr. Woody Seller.  Echocardiogram in August 2013 showed moderate LVH with LVEF 55-60%, stable AV prosthesis, mean gradient 14 mmHg.  She has chronic leg edema, uses compression stockings and remains on diuretic treatment.   Allergies  Allergen Reactions  . Amiodarone Nausea And Vomiting    Severe nausea    Current Outpatient Prescriptions  Medication Sig Dispense Refill  . atorvastatin (LIPITOR) 20 MG tablet Take 1 tablet (20 mg total) by mouth daily at 6 PM.  30 tablet  1  . Empagliflozin (JARDIANCE) 10 MG TABS Take 10 mg by mouth daily.      . furosemide (LASIX) 20 MG tablet Take 20 mg by mouth daily.       Marland Kitchen glipiZIDE (GLUCOTROL XL) 10 MG 24 hr tablet Take 10 mg by mouth 2 (two) times daily.      Marland Kitchen levothyroxine (SYNTHROID, LEVOTHROID) 25 MCG tablet Take 1 tablet by mouth Daily.      . metoprolol tartrate (LOPRESSOR) 25 MG tablet Take 1 tablet (25 mg total) by mouth 2 (two) times daily.  60 tablet  1  . potassium chloride (K-DUR,KLOR-CON) 10 MEQ tablet Take 10 mEq by mouth daily.      . raloxifene (EVISTA) 60 MG tablet Take 60 mg by mouth daily.      . saxagliptin HCl (ONGLYZA) 5 MG TABS tablet Take 5 mg by mouth daily.      . vitamin C (ASCORBIC ACID) 500 MG tablet Take 500 mg by mouth daily.      . Vitamin D, Ergocalciferol, (DRISDOL) 50000 UNITS CAPS Take 50,000 Units by mouth every 7 (seven) days. mondays       No current facility-administered medications for this visit.    Past Medical History  Diagnosis Date  . Essential hypertension, benign   . Type 2 diabetes mellitus   . Peripheral vascular disease     . History of anemia   . Rectal cancer   . GERD (gastroesophageal reflux disease)   . Gout   . Anxiety   . Developmental delay   . 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 S/P AVR (aortic valve replacement) 07/05/2011    19 mm [facility] Ease pericardial tissue valve with septal myomectomy  . Status post myomectomy 07/05/2011  . Postoperative atrial fibrillation     Initially on amiodarone but discontinued due to severe nausea.    Past Surgical History  Procedure Laterality Date  . Abdominal hysterectomy    . Colonoscopy w/ biopsies and polypectomy    . Low anterior bowel resection  2011    rectal cancer  . Coronary artery bypass graft  07/04/2011    Procedure: CORONARY ARTERY BYPASS GRAFTING (CABG);  Surgeon: Rexene Alberts, MD;  Location: [facility];  Service: Open Heart Surgery;  Laterality: N/A;  four grafts using bilateral leg greater saphenous vein harvested endoscopically.  . Aortic valve replacement  07/04/2011    Procedure: AORTIC VALVE REPLACEMENT (AVR);  Surgeon: Rexene Alberts, MD;  Location: [facility];  Service: Open Heart Surgery;  Laterality: N/A;  . Myomectomy  07/04/2011    Procedure: MYOMECTOMY;  Surgeon: Rexene Alberts, MD;  Location: [facility];  Service: Open Heart Surgery;  Laterality: N/A;  septal    Social History Ms. Robbs reports that she quit smoking about 24 years ago. Her smoking use included Cigarettes. She has a 20 pack-year smoking history. She has never used smokeless tobacco. Ms. Rossetti reports that she does not drink alcohol.  Review of Systems No palpitations, falls, syncope. Otherwise as outlined.  Physical Examination Filed Vitals:   04/22/13 1521  BP: 124/74  Pulse: 91   Filed Weights   04/22/13 1521  Weight: 171 lb (77.565 kg)    Short statured, overweight woman in no acute distress  HEENT: Conjunctiva and lids normal, oropharynx clear.  Neck: Supple, no elevated JVP or carotid bruits, no thyromegaly.   Lungs: Clear to auscultation, nonlabored breathing at rest.  Cardiac: Regular rate and rhythm, no S3, [id number] systolic murmur, no pericardial rub.  Abdomen: Soft, nontender, bowel sounds present.  Extremities: Chronic appearing edema/lymphedema, with compression stockings.   Problem List and Plan   S/P AVR (aortic valve replacement) Stable examination,  Last echocardiogram in 2013. Plan to continue observation for now.  Coronary atherosclerosis of native coronary artery Multivessel disease status post CABG in March 2013. No active angina. Continues on aspirin, statin.  Dyslipidemia On Lipitor, followed by Dr. Woody Seller.    Satira Sark, M.D., F.A.C.C.

## 2013-04-22 NOTE — Patient Instructions (Signed)
   Stop Bumex Continue all other current medications. Your physician wants you to follow up in:  1 year.  You will receive a reminder letter in the mail one-two months in advance.  If you don't receive a letter, please call our office to schedule the follow up appointment

## 2013-04-22 NOTE — Assessment & Plan Note (Signed)
Multivessel disease status post CABG in March 2013. No active angina. Continues on aspirin, statin.

## 2013-04-22 NOTE — Assessment & Plan Note (Signed)
Stable examination,  Last echocardiogram in 2013. Plan to continue observation for now.

## 2013-04-22 NOTE — Assessment & Plan Note (Signed)
On Lipitor, followed by Dr. Vyas 

## 2013-05-02 IMAGING — CR DG CHEST 1V PORT
1 series · 1 of 1 positions shown · non-contrast
Comparison: 07/02/2011

CLINICAL DATA: Coronary artery disease.  Postop from coronary
bypass grafting.

PORTABLE CHEST - 1 VIEW

[AP]
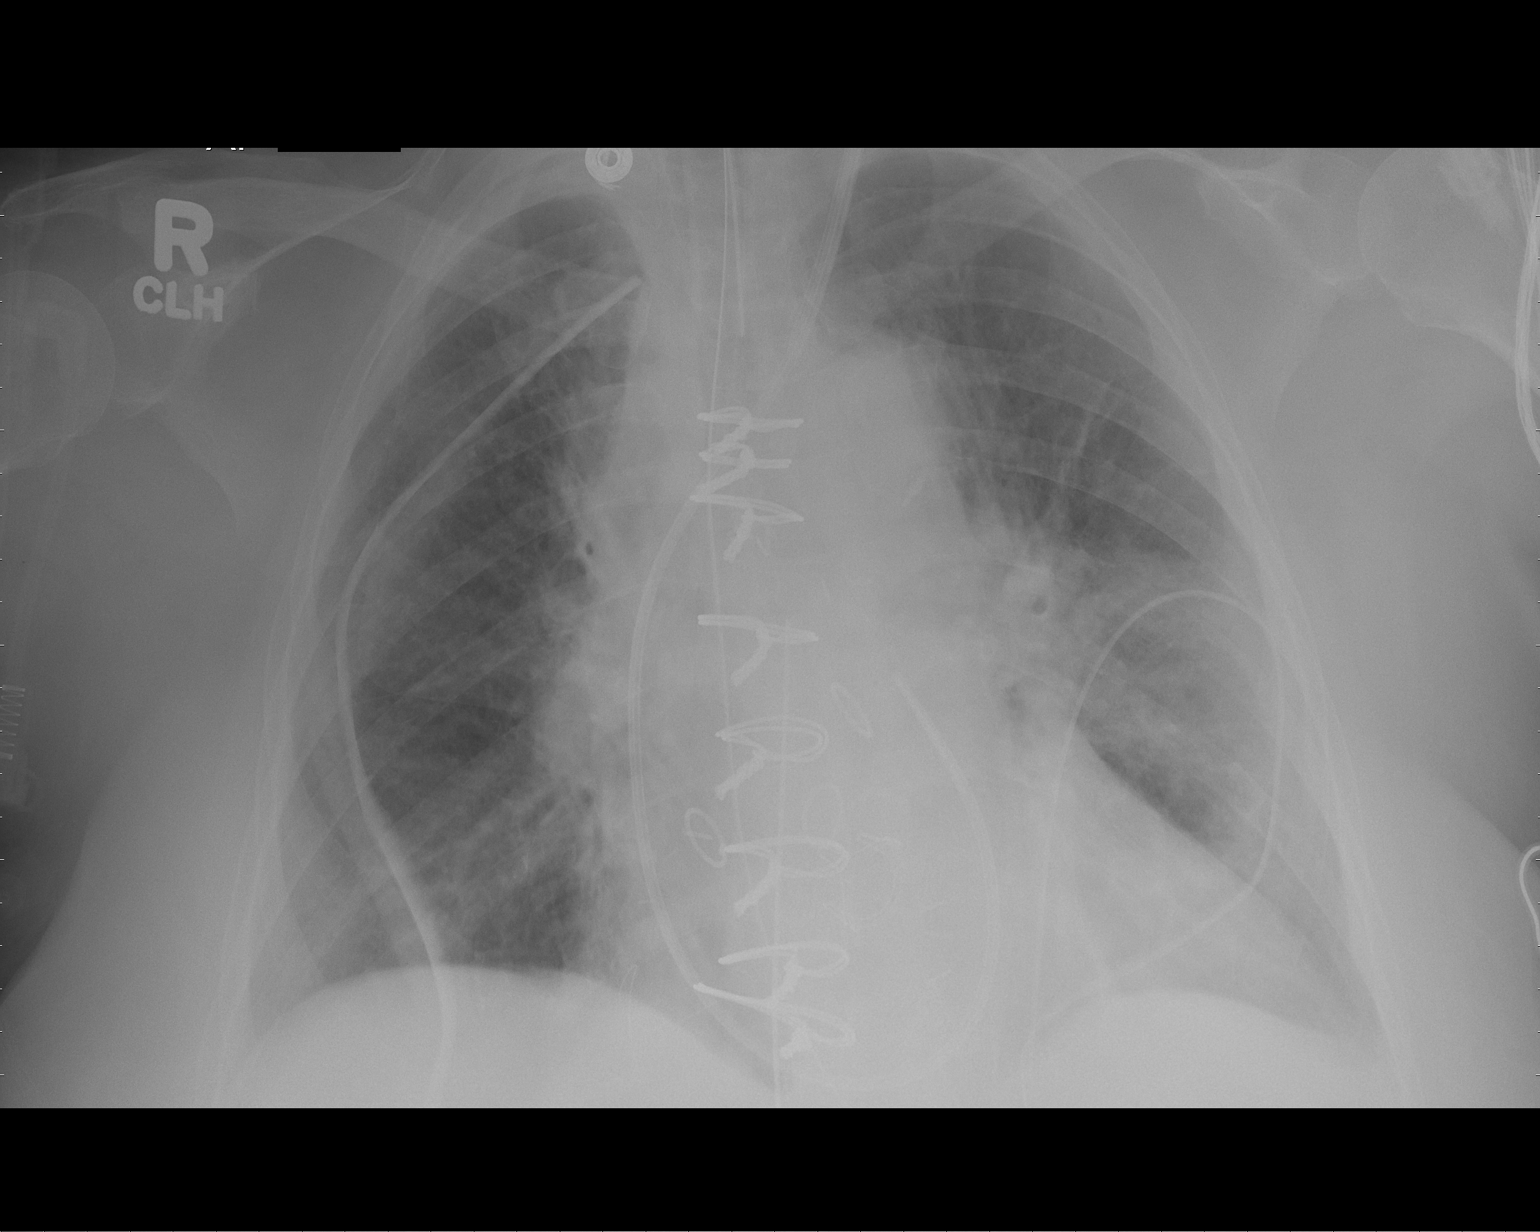

[1 of 1 positions shown; findings below may reference images not displayed]

FINDINGS: Interval CABG.  Swan-Ganz catheter tip is in the main
pulmonary artery.  Endotracheal tube, nasogastric tube, mediastinal
drain, and bilateral chest tubes are seen in appropriate position.
No evidence of pneumothorax.

Cardiomegaly stable.  Atelectasis is seen in the left perihilar
region and both lung bases.
IMPRESSION: 1.  Postoperative chest.  No evidence of pneumothorax.
2.  Left perihilar and bibasilar atelectasis.
3.  Stable cardiomegaly.

## 2013-05-04 IMAGING — CR DG CHEST 1V PORT
1 series · 1 of 1 positions shown · non-contrast
Comparison: 07/07/2011

CLINICAL DATA: Confirm line placement

PORTABLE CHEST - 1 VIEW

[view not recorded]
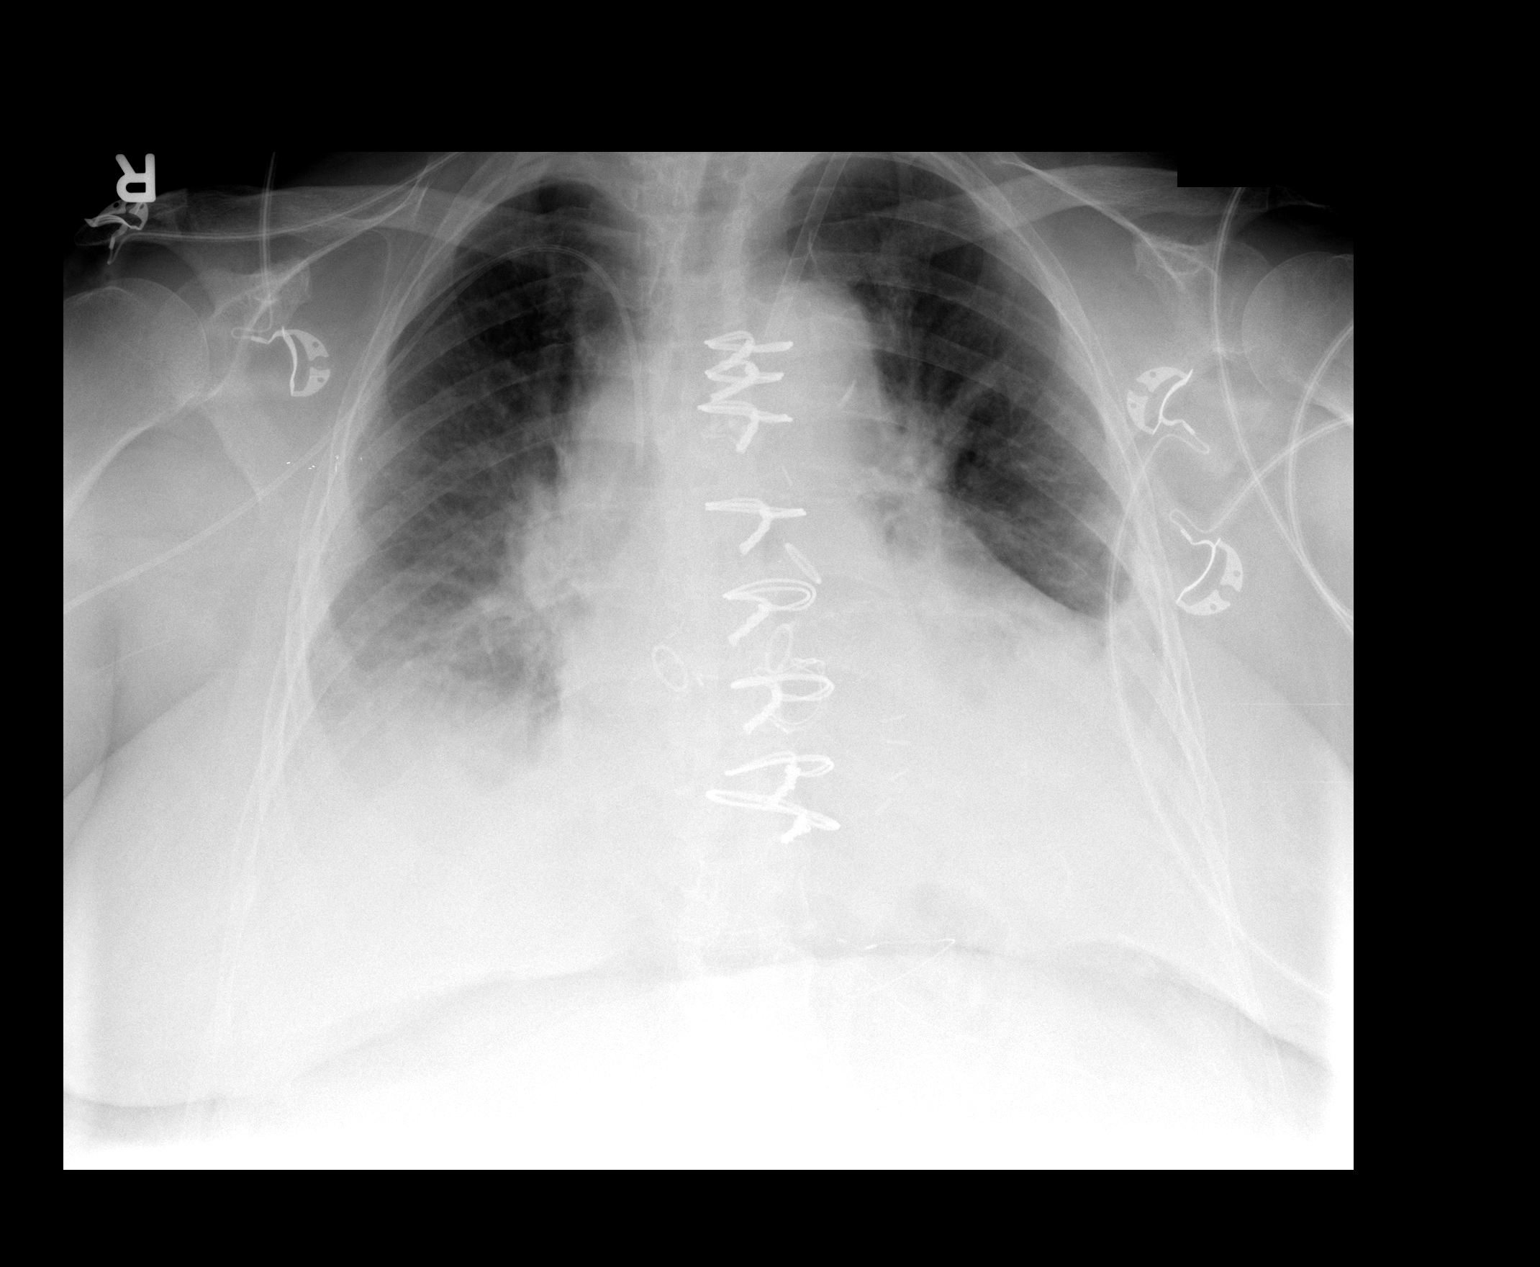

[1 of 1 positions shown; findings below may reference images not displayed]

FINDINGS: Right PICC line tip overlies the level of the superior
vena cava.  Left IJ sheath remains in place, tip to the level of
the left brachycephalic.  The patient has had median sternotomy and
CABG.  Heart is enlarged.  There are bilateral pleural effusions.
Bibasilar opacities obscuring hemidiaphragms, consistent with
atelectasis or infiltrates.
IMPRESSION: 1.  Interval placement of right PICC line, tip to the superior vena
cava.
2.  Persistent bibasilar opacities.

## 2013-05-06 IMAGING — CR DG CHEST 2V
2 series · 2 of 2 positions shown · non-contrast
Comparison: 07/07/2011

CLINICAL DATA: Status post CABG, chest pain, short of breath

CHEST - 2 VIEW

[w chest pa]
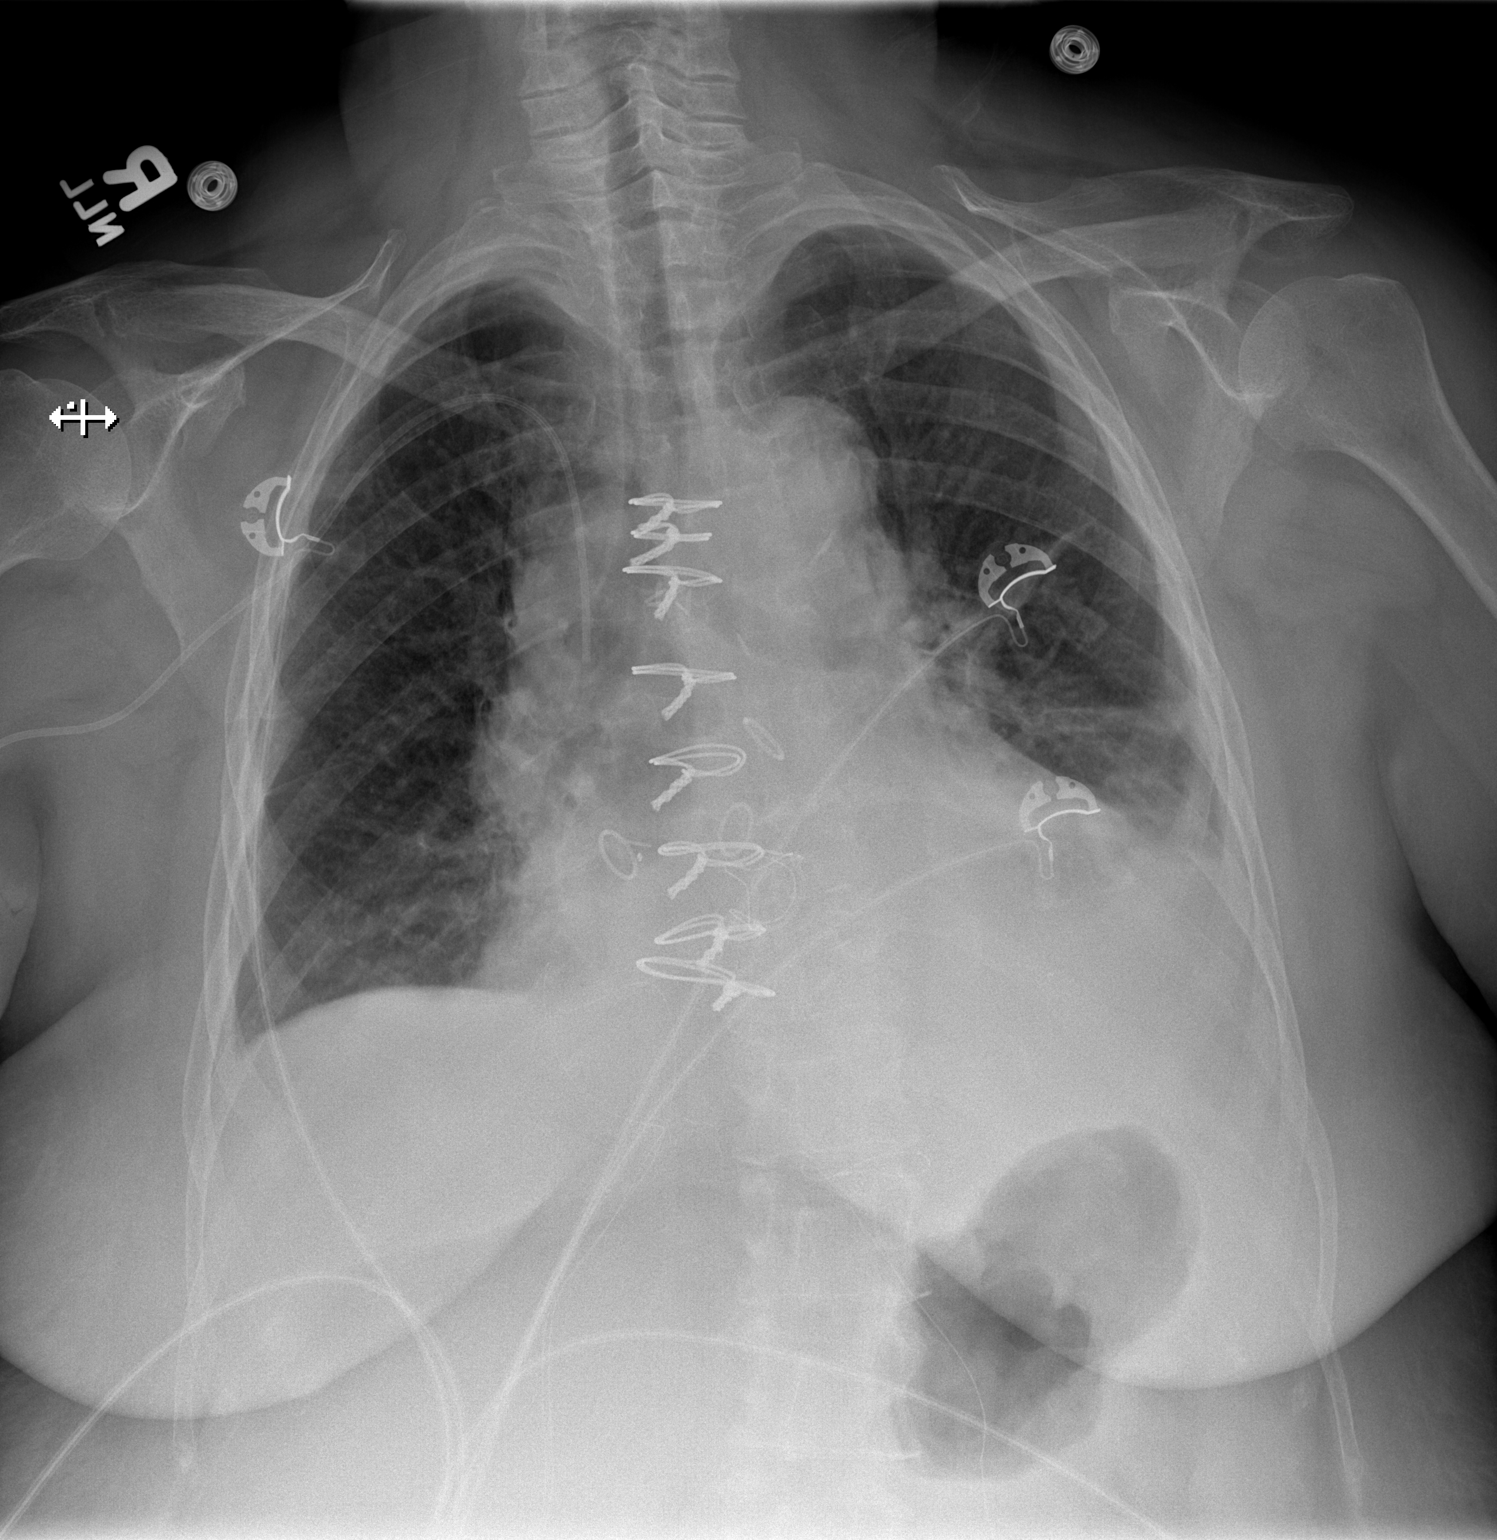

[w chest lat]
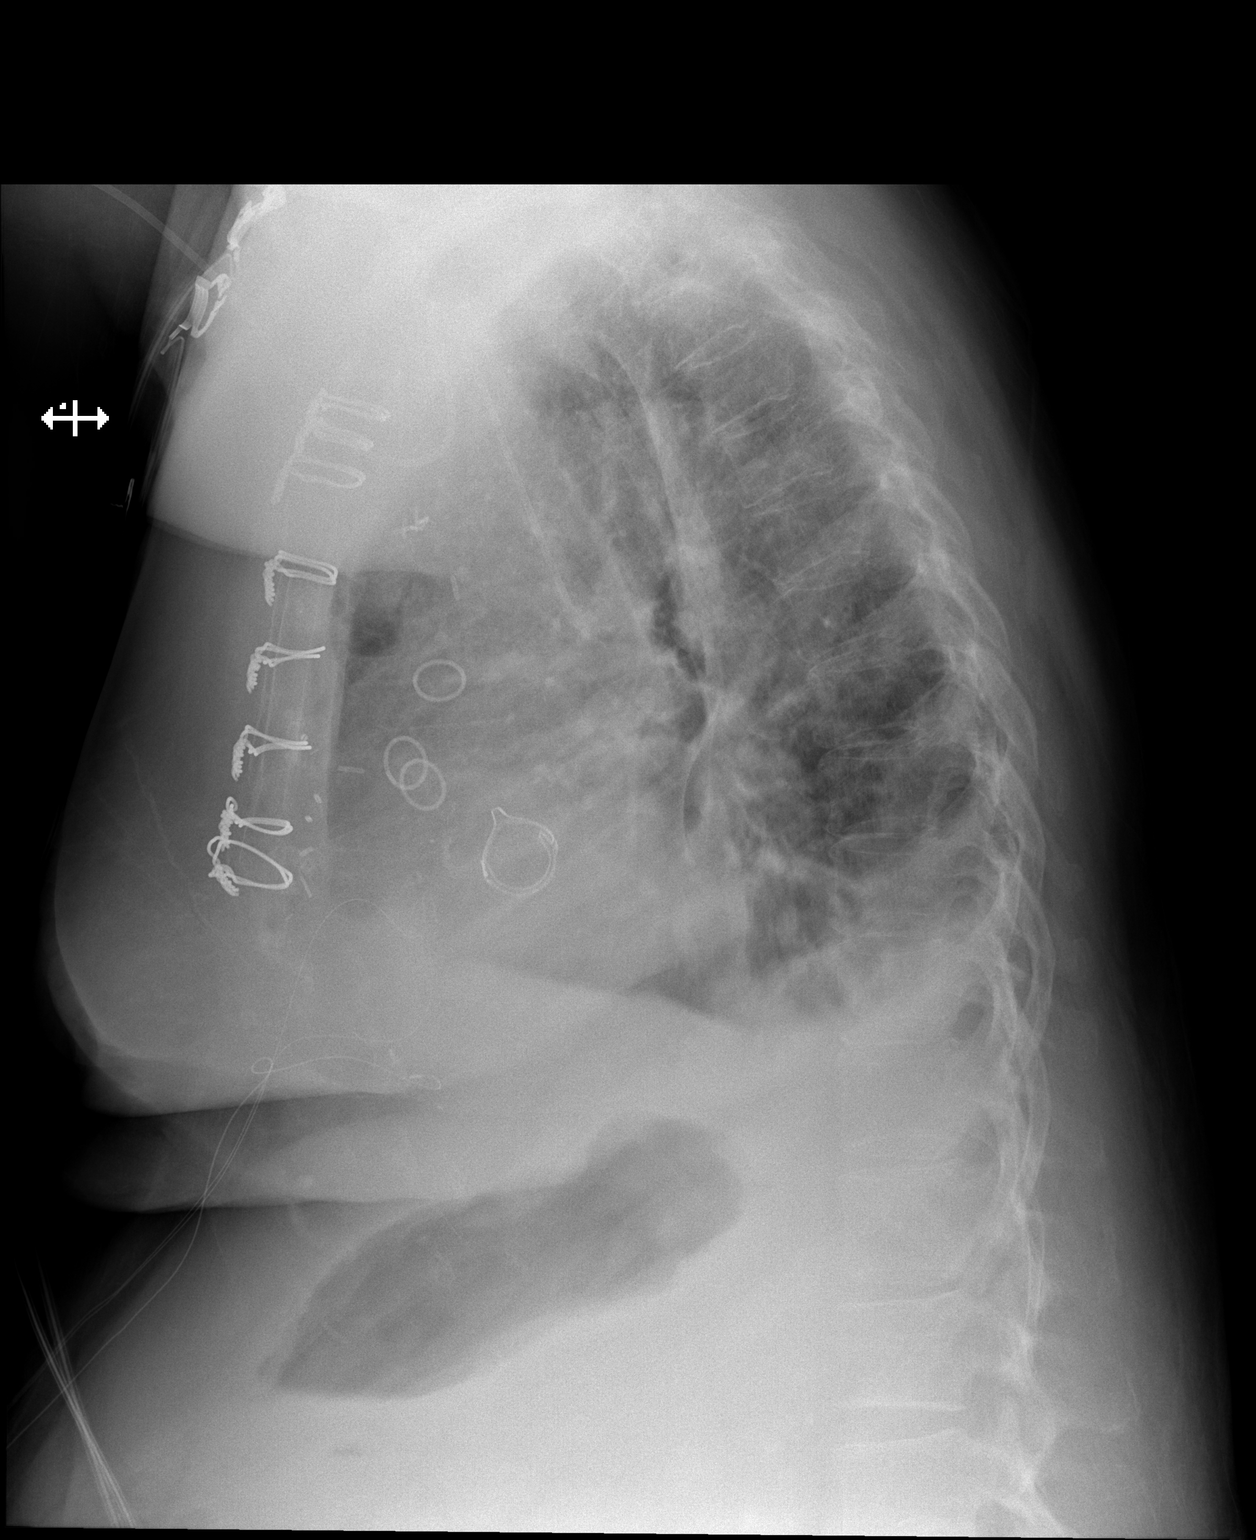

[2 of 2 positions shown; findings below may reference images not displayed]

FINDINGS: Patchy left lower lobe opacity, likely atelectasis, with
associated small left pleural effusion.  Prior right pleural
effusion has improved / resolved.  No pneumothorax.

The heart is mildly enlarged. Postsurgical changes related to prior
CABG.

Stable right arm PICC.
IMPRESSION: Small left pleural effusion with associated left lower lobe
atelectasis.

Right pleural effusion has improved / resolved.

## 2013-05-11 IMAGING — CR DG ABDOMEN 1V
1 series · 1 of 1 positions shown · non-contrast
Comparison: None.

CLINICAL DATA: Nausea

ABDOMEN - 1 VIEW

[t abdomen supine]
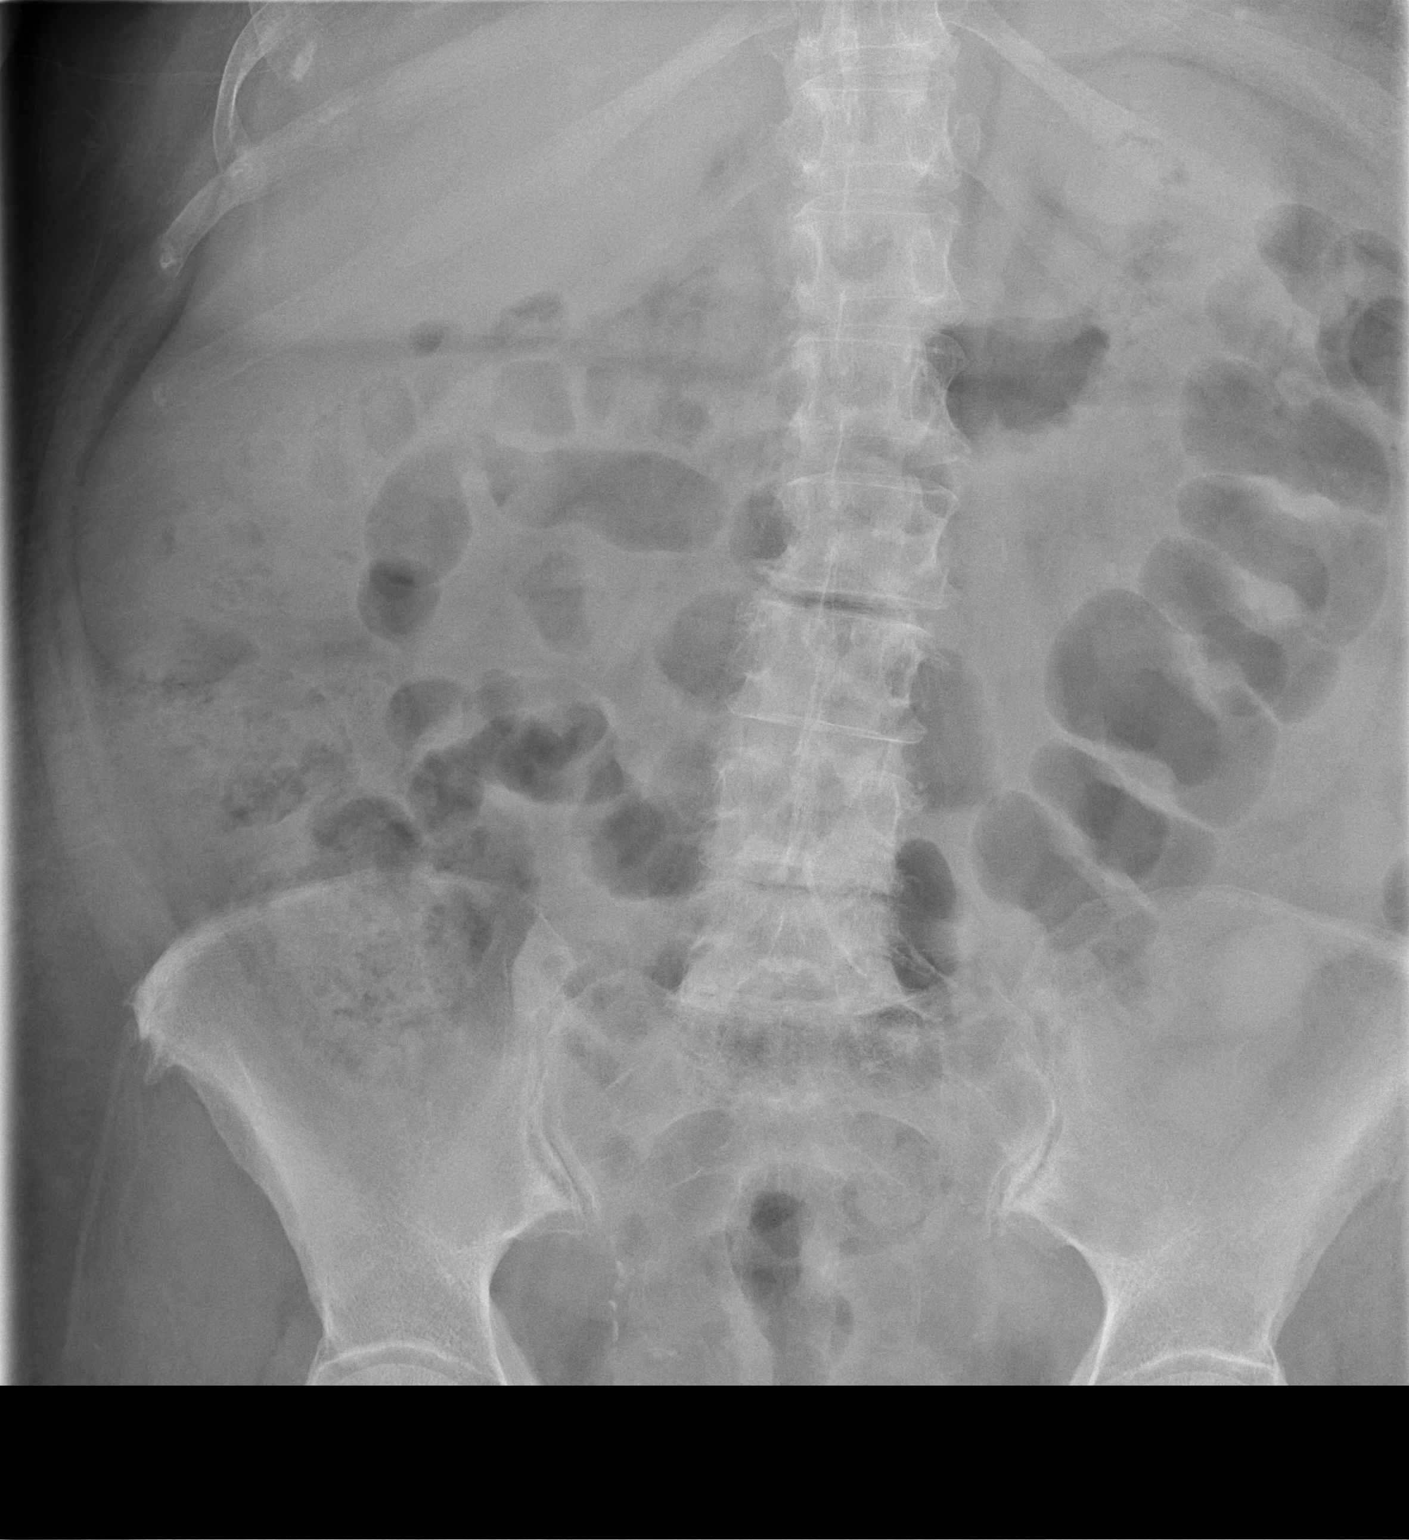

[1 of 1 positions shown; findings below may reference images not displayed]

FINDINGS: No disproportionate dilatation of bowel.  Degenerative
changes in the lumbar spine.  Vascular calcifications project over
the pelvis.  No obvious free intraperitoneal gas.
IMPRESSION: Nonobstructive bowel gas pattern.

## 2013-07-22 ENCOUNTER — Encounter (INDEPENDENT_AMBULATORY_CARE_PROVIDER_SITE_OTHER): Payer: Self-pay | Admitting: *Deleted

## 2013-07-27 ENCOUNTER — Encounter (INDEPENDENT_AMBULATORY_CARE_PROVIDER_SITE_OTHER): Payer: Self-pay

## 2013-08-01 ENCOUNTER — Other Ambulatory Visit (INDEPENDENT_AMBULATORY_CARE_PROVIDER_SITE_OTHER): Payer: Self-pay | Admitting: *Deleted

## 2013-08-01 ENCOUNTER — Telehealth (INDEPENDENT_AMBULATORY_CARE_PROVIDER_SITE_OTHER): Payer: Self-pay | Admitting: *Deleted

## 2013-08-01 DIAGNOSIS — Z85048 Personal history of other malignant neoplasm of rectum, rectosigmoid junction, and anus: Secondary | ICD-10-CM

## 2013-08-01 DIAGNOSIS — Z1211 Encounter for screening for malignant neoplasm of colon: Secondary | ICD-10-CM

## 2013-08-01 MED ORDER — PEG 3350-KCL-NA BICARB-NACL 420 G PO SOLR: 4000.0000 mL | Freq: Once | ORAL | Status: DC

## 2013-08-01 NOTE — Telephone Encounter (Signed)
Patient needs trilyte 

## 2013-08-30 ENCOUNTER — Telehealth (INDEPENDENT_AMBULATORY_CARE_PROVIDER_SITE_OTHER): Payer: Self-pay | Admitting: *Deleted

## 2013-08-30 NOTE — Telephone Encounter (Signed)
agree

## 2013-08-30 NOTE — Telephone Encounter (Signed)
  Procedure: tcs  Reason/Indication:  Hx rectal cancer  Has patient had this procedure before?  Yes, 2011 -- scanned  If so, when, by whom and where?    Is there a family history of colon cancer?  no  Who?  What age when diagnosed?    Is patient diabetic?   yes      Does patient have prosthetic heart valve?  Yes, heart valve replacement, pericardial tissue heart valve (06/2011)  Do you have a pacemaker?  no  Has patient ever had endocarditis? no  Has patient had joint replacement within last 12 months?  no  Does patient tend to be constipated or take laxatives? no  Is patient on Coumadin, Plavix and/or Aspirin? yes  Medications: asa 81 mg daily, glucotrol 10 mg bid (am & pm), jardiance 10 mg daily (am), inglyza 5 mg daily (am), vit d 50,000 units weekly, metoprolol 25 mg bid, atorvastatin 20 mg daily, vit c 500 mg daily, lasix 20 mg daily, klor con 10 meq daily, synthroid 25 mcg daily, evista 60 mg daily  Allergies: amiodarone  Medication Adjustment: asa 2 days, hold hold evening dose of Glucotrol evening before procedure and don't take any diabetic medicine morning of procedure  Procedure date & time: 09/14/13 at 830

## 2013-09-01 ENCOUNTER — Encounter (HOSPITAL_COMMUNITY): Payer: Self-pay | Admitting: Pharmacy Technician

## 2013-09-14 ENCOUNTER — Ambulatory Visit (HOSPITAL_COMMUNITY)
Admission: RE | Admit: 2013-09-14 | Discharge: 2013-09-14 | Disposition: A | Discharge status: Home / Self Care | Payer: Medicare Other | Source: Ambulatory Visit | Attending: Internal Medicine | Admitting: Internal Medicine

## 2013-09-14 ENCOUNTER — Encounter (HOSPITAL_COMMUNITY)
Admission: RE | Disposition: A | Discharge status: Home / Self Care | Payer: Self-pay | Source: Ambulatory Visit | Attending: Internal Medicine

## 2013-09-14 ENCOUNTER — Encounter (HOSPITAL_COMMUNITY): Payer: Self-pay | Admitting: *Deleted

## 2013-09-14 DIAGNOSIS — M109 Gout, unspecified: Secondary | ICD-10-CM | POA: Insufficient documentation

## 2013-09-14 DIAGNOSIS — Z85048 Personal history of other malignant neoplasm of rectum, rectosigmoid junction, and anus: Secondary | ICD-10-CM | POA: Insufficient documentation

## 2013-09-14 DIAGNOSIS — Z1211 Encounter for screening for malignant neoplasm of colon: Secondary | ICD-10-CM | POA: Insufficient documentation

## 2013-09-14 DIAGNOSIS — F411 Generalized anxiety disorder: Secondary | ICD-10-CM | POA: Insufficient documentation

## 2013-09-14 DIAGNOSIS — E119 Type 2 diabetes mellitus without complications: Secondary | ICD-10-CM | POA: Insufficient documentation

## 2013-09-14 DIAGNOSIS — K5730 Diverticulosis of large intestine without perforation or abscess without bleeding: Secondary | ICD-10-CM | POA: Insufficient documentation

## 2013-09-14 DIAGNOSIS — D649 Anemia, unspecified: Secondary | ICD-10-CM | POA: Insufficient documentation

## 2013-09-14 DIAGNOSIS — K219 Gastro-esophageal reflux disease without esophagitis: Secondary | ICD-10-CM | POA: Insufficient documentation

## 2013-09-14 DIAGNOSIS — Z888 Allergy status to other drugs, medicaments and biological substances status: Secondary | ICD-10-CM | POA: Insufficient documentation

## 2013-09-14 DIAGNOSIS — D126 Benign neoplasm of colon, unspecified: Secondary | ICD-10-CM

## 2013-09-14 DIAGNOSIS — Z954 Presence of other heart-valve replacement: Secondary | ICD-10-CM | POA: Insufficient documentation

## 2013-09-14 DIAGNOSIS — I10 Essential (primary) hypertension: Secondary | ICD-10-CM | POA: Insufficient documentation

## 2013-09-14 DIAGNOSIS — Z951 Presence of aortocoronary bypass graft: Secondary | ICD-10-CM | POA: Insufficient documentation

## 2013-09-14 DIAGNOSIS — Z87891 Personal history of nicotine dependence: Secondary | ICD-10-CM | POA: Insufficient documentation

## 2013-09-14 DIAGNOSIS — F88 Other disorders of psychological development: Secondary | ICD-10-CM | POA: Insufficient documentation

## 2013-09-14 DIAGNOSIS — Z79899 Other long term (current) drug therapy: Secondary | ICD-10-CM | POA: Insufficient documentation

## 2013-09-14 DIAGNOSIS — Z7982 Long term (current) use of aspirin: Secondary | ICD-10-CM | POA: Insufficient documentation

## 2013-09-14 DIAGNOSIS — I739 Peripheral vascular disease, unspecified: Secondary | ICD-10-CM | POA: Insufficient documentation

## 2013-09-14 HISTORY — PX: COLONOSCOPY: SHX5424

## 2013-09-14 LAB — GLUCOSE, CAPILLARY: GLUCOSE-CAPILLARY: 164 mg/dL — AB (ref 70–99)

## 2013-09-14 SURGERY — COLONOSCOPY
Anesthesia: Moderate Sedation

## 2013-09-14 MED ORDER — MIDAZOLAM HCL 5 MG/5ML IJ SOLN
INTRAMUSCULAR | Status: DC | PRN
  Administered 2013-09-14: 1 mg via INTRAVENOUS
  Administered 2013-09-14 (×2): 2 mg via INTRAVENOUS

## 2013-09-14 MED ORDER — MIDAZOLAM HCL 5 MG/5ML IJ SOLN
INTRAMUSCULAR | Status: AC
  Filled 2013-09-14: qty 10

## 2013-09-14 MED ORDER — SODIUM CHLORIDE 0.9 % IV SOLN
INTRAVENOUS | Status: DC
  Administered 2013-09-14: 1000 mL via INTRAVENOUS

## 2013-09-14 MED ORDER — CEFAZOLIN SODIUM-DEXTROSE 2-3 GM-% IV SOLR
2.0000 g | Freq: Once | INTRAVENOUS | Status: AC
  Administered 2013-09-14: 2 g via INTRAVENOUS

## 2013-09-14 MED ORDER — CEFAZOLIN SODIUM-DEXTROSE 2-3 GM-% IV SOLR
INTRAVENOUS | Status: AC
  Filled 2013-09-14: qty 50

## 2013-09-14 MED ORDER — CEFAZOLIN SODIUM 1 G IJ SOLR: 2.0000 g | Freq: Once | INTRAMUSCULAR | Status: DC

## 2013-09-14 MED ORDER — MEPERIDINE HCL 50 MG/ML IJ SOLN
INTRAMUSCULAR | Status: DC | PRN
  Administered 2013-09-14: 15 mg via INTRAVENOUS
  Administered 2013-09-14: 10 mg via INTRAVENOUS
  Administered 2013-09-14: 25 mg via INTRAVENOUS

## 2013-09-14 MED ORDER — MEPERIDINE HCL 50 MG/ML IJ SOLN
INTRAMUSCULAR | Status: AC
  Filled 2013-09-14: qty 1

## 2013-09-14 MED ORDER — STERILE WATER FOR IRRIGATION IR SOLN
Status: DC | PRN
  Administered 2013-09-14: 09:00:00

## 2013-09-14 NOTE — Op Note (Signed)
COLONOSCOPY PROCEDURE REPORT  PATIENT:  Melinda Vang  MR#:  [id number] Birthdate:  April 29, 1945, 68 y.o., female Endoscopist:  Dr. Rogene Houston, MD Referred By:  Dr. Dr. Glenda Chroman, MD Procedure Date: 09/14/2013  Procedure:   Colonoscopy  Indications:  Patient is 68 year old Caucasian female who has history of rectal adenocarcinoma status post TEM in August 2011 14 2 N0 MX lesion who presents for surveillance colonoscopy. She denies rectal bleeding change in her bowel habits or abdominal pain.  Informed Consent:  The procedure and risks were reviewed with the patient and informed consent was obtained.  Medications:  Demerol 50 mg IV Versed 5 mg IV  Description of procedure:  After a digital rectal exam was performed, that colonoscope was advanced from the anus through the rectum and colon to the area of the cecum, ileocecal valve and appendiceal orifice. The cecum was deeply intubated. These structures were well-seen and photographed for the record. From the level of the cecum and ileocecal valve, the scope was slowly and cautiously withdrawn. The mucosal surfaces were carefully surveyed utilizing scope tip to flexion to facilitate fold flattening as needed. The scope was pulled down into the rectum where a thorough exam including retroflexion was performed.  Findings:   Prep satisfactory. Small polyp removed from across ileocecal valve it combination of cold snare and biopsy. Small polyp ablated via cold biopsy from ascending colon. Scattered diverticula at sigmoid colon. Normal rectal mucosa with small caliber distally along the retroflexion of the scope.   Therapeutic/Diagnostic Maneuvers Performed:  See above  Complications:  None  Cecal Withdrawal Time:  19 minutes  Impression:  Examination performed to cecum. Small polyp located across and ileocecal valve. It was removed using cold snare and biopsy. Small polyp ablated via cold biopsy from ascending colon. Mild  sigmoid colon diverticulosis. Noncritical narrowing of distal rectal segment. No abnormality noted on retroflexed view.   Recommendations:  Standard instructions given. I will contact patient with biopsy results. Next colonoscopy in 3 years.  Rogene Houston  09/14/2013 9:29 AM  CC: Dr. Glenda Chroman., MD & Dr. Rayne Du ref. provider found C[facility]   Dr. Cheryll Cockayne, MD

## 2013-09-14 NOTE — Discharge Instructions (Signed)
Resume usual medications and high fiber diet. Physician will call with biopsy results.  Colonoscopy, Care After Refer to this sheet in the next few weeks. These instructions provide you with information on caring for yourself after your procedure. Your health care provider may also give you more specific instructions. Your treatment has been planned according to current medical practices, but problems sometimes occur. Call your health care provider if you have any problems or questions after your procedure. WHAT TO EXPECT AFTER THE PROCEDURE  After your procedure, it is typical to have the following:  A small amount of blood in your stool.  Moderate amounts of gas and mild abdominal cramping or bloating. HOME CARE INSTRUCTIONS  Do not drive, operate machinery, or sign important documents for 24 hours.  You may shower and resume your regular physical activities, but move at a slower pace for the first 24 hours.  Take frequent rest periods for the first 24 hours.  Walk around or put a warm pack on your abdomen to help reduce abdominal cramping and bloating.  Drink enough fluids to keep your urine clear or pale yellow.  You may resume your normal diet as instructed by your health care provider. Avoid heavy or fried foods that are hard to digest.  Avoid drinking alcohol for 24 hours or as instructed by your health care provider.  Only take over-the-counter or prescription medicines as directed by your health care provider.  If a tissue sample (biopsy) was taken during your procedure:  Do not take aspirin or blood thinners for 7 days, or as instructed by your health care provider.  Do not drink alcohol for 7 days, or as instructed by your health care provider.  Eat soft foods for the first 24 hours. SEEK MEDICAL CARE IF: You have persistent spotting of blood in your stool 2 3 days after the procedure. SEEK IMMEDIATE MEDICAL CARE IF:  You have more than a small spotting of blood in  your stool.  You pass large blood clots in your stool.  Your abdomen is swollen (distended).  You have nausea or vomiting.  You have a fever.  You have increasing abdominal pain that is not relieved with medicine.  High-Fiber Diet Fiber is found in fruits, vegetables, and grains. A high-fiber diet encourages the addition of more whole grains, legumes, fruits, and vegetables in your diet. The recommended amount of fiber for adult males is 38 g per day. For adult females, it is 25 g per day. Pregnant and lactating women should get 28 g of fiber per day. If you have a digestive or bowel problem, ask your caregiver for advice before adding high-fiber foods to your diet. Eat a variety of high-fiber foods instead of only a select few type of foods.  PURPOSE  To increase stool bulk.  To make bowel movements more regular to prevent constipation.  To lower cholesterol.  To prevent overeating. WHEN IS THIS DIET USED?  It may be used if you have constipation and hemorrhoids.  It may be used if you have uncomplicated diverticulosis (intestine condition) and irritable bowel syndrome.  It may be used if you need help with weight management.  It may be used if you want to add it to your diet as a protective measure against atherosclerosis, diabetes, and cancer. SOURCES OF FIBER  Whole-grain breads and cereals.  Fruits, such as apples, oranges, bananas, berries, prunes, and pears.  Vegetables, such as green peas, carrots, sweet potatoes, beets, broccoli, cabbage, spinach, and artichokes.  Legumes, such split peas, soy, lentils.  Almonds. FIBER CONTENT IN FOODS Starches and Grains / Dietary Fiber (g)  Cheerios, 1 cup / 3 g  Corn Flakes cereal, 1 cup / 0.7 g  Rice crispy treat cereal, 1 cup / 0.3 g  Instant oatmeal (cooked),  cup / 2 g  Frosted wheat cereal, 1 cup / 5.1 g  Brown, long-grain rice (cooked), 1 cup / 3.5 g  White, long-grain rice (cooked), 1 cup / 0.6  g  Enriched macaroni (cooked), 1 cup / 2.5 g Legumes / Dietary Fiber (g)  Baked beans (canned, plain, or vegetarian),  cup / 5.2 g  Kidney beans (canned),  cup / 6.8 g  Pinto beans (cooked),  cup / 5.5 g Breads and Crackers / Dietary Fiber (g)  Plain or honey graham crackers, 2 squares / 0.7 g  Saltine crackers, 3 squares / 0.3 g  Plain, salted pretzels, 10 pieces / 1.8 g  Whole-wheat bread, 1 slice / 1.9 g  White bread, 1 slice / 0.7 g  Raisin bread, 1 slice / 1.2 g  Plain bagel, 3 oz / 2 g  Flour tortilla, 1 oz / 0.9 g  Corn tortilla, 1 small / 1.5 g  Hamburger or hotdog bun, 1 small / 0.9 g Fruits / Dietary Fiber (g)  Apple with skin, 1 medium / 4.4 g  Sweetened applesauce,  cup / 1.5 g  Banana,  medium / 1.5 g  Grapes, 10 grapes / 0.4 g  Orange, 1 small / 2.3 g  Raisin, 1.5 oz / 1.6 g  Melon, 1 cup / 1.4 g Vegetables / Dietary Fiber (g)  Green beans (canned),  cup / 1.3 g  Carrots (cooked),  cup / 2.3 g  Broccoli (cooked),  cup / 2.8 g  Peas (cooked),  cup / 4.4 g  Mashed potatoes,  cup / 1.6 g  Lettuce, 1 cup / 0.5 g  Corn (canned),  cup / 1.6 g  Tomato,  cup / 1.1 g

## 2013-09-14 NOTE — H&P (Signed)
Melinda Vang is an 68 y.o. female.   Chief Complaint: Patient is  here for colonoscopy. HPI: Patient is 68 year old Caucasian female who has history of colonic adenomas and rectal carcinoma. She had colonoscopy in March 2011 and was referred to Dr. Cheryll Vang of [facility] and underwent TEMS and has done well. She had proctoscopy one year ago by Dr. Morton Vang without local recurrence. She is here for surveillance colonoscopy. She denies abdominal pain rectal bleeding or change in her bowel habits. Family history is negative for CRC.  Past Medical History  Diagnosis Date  . Essential hypertension, benign   . Type 2 diabetes mellitus   . Peripheral vascular disease   . History of anemia   . Rectal cancer   . GERD (gastroesophageal reflux disease)   . Gout   . Anxiety   . Developmental delay   . 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 S/P AVR (aortic valve replacement) 07/05/2011    19 mm [facility] Ease pericardial tissue valve with septal myomectomy  . Status post myomectomy 07/05/2011  . Postoperative atrial fibrillation     Initially on amiodarone but discontinued due to severe nausea.    Past Surgical History  Procedure Laterality Date  . Abdominal hysterectomy    . Colonoscopy w/ biopsies and polypectomy    . Low anterior bowel resection  2011    rectal cancer  . Coronary artery bypass graft  07/04/2011    Procedure: CORONARY ARTERY BYPASS GRAFTING (CABG);  Surgeon: Melinda Alberts, MD;  Location: [place];  Service: Open Heart Surgery;  Laterality: N/A;  four grafts using bilateral leg greater saphenous vein harvested endoscopically.  . Aortic valve replacement  07/04/2011    Procedure: AORTIC VALVE REPLACEMENT (AVR);  Surgeon: Melinda Alberts, MD;  Location: [place];  Service: Open Heart Surgery;  Laterality: N/A;  . Myomectomy  07/04/2011    Procedure: MYOMECTOMY;  Surgeon: Melinda Alberts, MD;  Location: [place];  Service: Open Heart Surgery;   Laterality: N/A;  septal    History reviewed. No pertinent family history. Social History:  reports that she quit smoking about 24 years ago. Her smoking use included Cigarettes. She has a 20 pack-year smoking history. She has never used smokeless tobacco. She reports that she does not drink alcohol or use illicit drugs.  Allergies:  Allergies  Allergen Reactions  . Amiodarone Nausea And Vomiting    Severe nausea    Medications Prior to Admission  Medication Sig Dispense Refill  . aspirin EC 81 MG tablet Take 81 mg by mouth daily.      Marland Kitchen atorvastatin (LIPITOR) 20 MG tablet Take 1 tablet (20 mg total) by mouth daily at 6 PM.  30 tablet  1  . Empagliflozin (JARDIANCE) 10 MG TABS Take 10 mg by mouth daily.      . furosemide (LASIX) 20 MG tablet Take 20 mg by mouth daily.       Marland Kitchen glipiZIDE (GLUCOTROL XL) 10 MG 24 hr tablet Take 10 mg by mouth 2 (two) times daily.      Marland Kitchen levothyroxine (SYNTHROID, LEVOTHROID) 25 MCG tablet Take 1 tablet by mouth Daily.      . metoprolol tartrate (LOPRESSOR) 25 MG tablet Take 1 tablet (25 mg total) by mouth 2 (two) times daily.  60 tablet  1  . polyethylene glycol-electrolytes (TRILYTE) 420 G solution Take 4,000 mLs by mouth once.  4000 mL  0  . potassium chloride (K-DUR,KLOR-CON) 10 MEQ tablet Take 10 mEq by mouth daily.      . raloxifene (EVISTA) 60 MG tablet Take 60 mg by mouth daily.      . saxagliptin HCl (ONGLYZA) 5 MG TABS tablet Take 5 mg by mouth daily.      . vitamin C (ASCORBIC ACID) 500 MG tablet Take 500 mg by mouth daily.      . Vitamin D, Ergocalciferol, (DRISDOL) 50000 UNITS CAPS Take 50,000 Units by mouth every 7 (seven) days. mondays      . HYDROcodone-acetaminophen (NORCO/VICODIN) 5-325 MG per tablet Take 1 tablet by mouth 3 (three) times daily as needed for moderate pain.        Results for orders placed during the hospital encounter of 09/14/13 (from the past 48 hour(s))  GLUCOSE, CAPILLARY     Status: Abnormal   Collection Time     09/14/13  8:12 AM      Result Value Ref Range   Glucose-Capillary 164 (*) 70 - 99 mg/dL   No results found.  ROS  Blood pressure 123/59, temperature 98.2 F (36.8 C), temperature source Oral, resp. rate 15, height 4\' 10"  (1.473 m), weight 179 lb (81.194 kg), SpO2 95.00%. Physical Exam  Constitutional: She appears well-developed and well-nourished.  HENT:  Mouth/Throat: Oropharynx is clear and moist.  Eyes: Conjunctivae are normal. No scleral icterus.  Neck: No thyromegaly present.  Cardiovascular: Normal rate, regular rhythm and normal heart sounds.   Murmur: grade 2/6 systolic ejection murmur best heard at aortic area. Respiratory: Effort normal and breath sounds normal.  GI: Soft. She exhibits no mass. There is no tenderness.  Musculoskeletal: She exhibits no edema.  Lymphadenopathy:    She has no cervical adenopathy.  Neurological: She is alert.  Skin: Skin is warm and dry.     Assessment/Plan History of rectal adenocarcinoma. S/P TEMS in August 2011 for T2 N0 MX lesion. Surveillance colonoscopy.  Melinda Vang Melinda Vang 09/14/2013, 8:45 AM

## 2013-09-15 ENCOUNTER — Encounter (HOSPITAL_COMMUNITY): Payer: Self-pay | Admitting: Internal Medicine

## 2013-09-27 ENCOUNTER — Encounter (INDEPENDENT_AMBULATORY_CARE_PROVIDER_SITE_OTHER): Payer: Self-pay | Admitting: *Deleted

## 2014-03-23 ENCOUNTER — Encounter (HOSPITAL_COMMUNITY): Payer: Self-pay | Admitting: Cardiovascular Disease

## 2014-06-08 ENCOUNTER — Ambulatory Visit: Payer: Medicare Other | Admitting: Cardiology

## 2014-06-29 ENCOUNTER — Ambulatory Visit (INDEPENDENT_AMBULATORY_CARE_PROVIDER_SITE_OTHER): Payer: Medicare Other | Admitting: Cardiology

## 2014-06-29 ENCOUNTER — Encounter: Payer: Self-pay | Admitting: Cardiology

## 2014-06-29 VITALS — BP 127/76 | HR 81 | Ht <= 58 in | Wt 171.8 lb

## 2014-06-29 DIAGNOSIS — Z954 Presence of other heart-valve replacement: Secondary | ICD-10-CM

## 2014-06-29 DIAGNOSIS — I10 Essential (primary) hypertension: Secondary | ICD-10-CM

## 2014-06-29 DIAGNOSIS — I2510 Atherosclerotic heart disease of native coronary artery without angina pectoris: Secondary | ICD-10-CM

## 2014-06-29 DIAGNOSIS — Z953 Presence of xenogenic heart valve: Secondary | ICD-10-CM

## 2014-06-29 NOTE — Patient Instructions (Signed)
Your physician wants you to follow-up in: 1 year with Dr. McDowell You will receive a reminder letter in the mail two months in advance. If you don't receive a letter, please call our office to schedule the follow-up appointment.  Your physician recommends that you continue on your current medications as directed. Please refer to the Current Medication list given to you today.  Thank you for choosing [facility]!!   

## 2014-06-29 NOTE — Progress Notes (Signed)
Cardiology Office Note  Date: 06/29/2014   ID: Melinda Vang, DOB Aug 07, 1945, MRN [id number]  PCP: Glenda Chroman., MD  Primary Cardiologist: Rozann Lesches, MD   Chief Complaint  Patient presents with  . Coronary Artery Disease  . Aortic valve disease    History of Present Illness: Melinda Vang is a 69 y.o. female last seen in January 2015. She is here today with a family member for a follow-up visit. Overall no major change in cardiac status. She is functional with her basic ADLs home. Denies any recent falls. She does not describe any chest pain symptoms and reports compliance with her medications. Follow-up tracing is reviewed below.  She will be seeing Dr. Woody Seller for a physical with lab work soon. Blood pressure looks well controlled today. I reviewed her last echocardiogram results from 2013.   Past Medical History  Diagnosis Date  . Essential hypertension, benign   . Type 2 diabetes mellitus   . Peripheral vascular disease   . History of anemia   . Rectal cancer   . GERD (gastroesophageal reflux disease)   . Gout   . Anxiety   . Developmental delay   . 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 S/P AVR (aortic valve replacement) 07/05/2011    19 mm [facility] Ease pericardial tissue valve with septal myomectomy  . Status post myomectomy 07/05/2011  . Postoperative atrial fibrillation     Initially on amiodarone but discontinued due to severe nausea.    Past Surgical History  Procedure Laterality Date  . Abdominal hysterectomy    . Colonoscopy w/ biopsies and polypectomy    . Low anterior bowel resection  2011    rectal cancer  . Coronary artery bypass graft  07/04/2011    Procedure: CORONARY ARTERY BYPASS GRAFTING (CABG);  Surgeon: Rexene Alberts, MD;  Location: [place];  Service: Open Heart Surgery;  Laterality: N/A;  four grafts using bilateral leg greater saphenous vein harvested endoscopically.  . Aortic valve  replacement  07/04/2011    Procedure: AORTIC VALVE REPLACEMENT (AVR);  Surgeon: Rexene Alberts, MD;  Location: [place];  Service: Open Heart Surgery;  Laterality: N/A;  . Myomectomy  07/04/2011    Procedure: MYOMECTOMY;  Surgeon: Rexene Alberts, MD;  Location: [place];  Service: Open Heart Surgery;  Laterality: N/A;  septal  . Colonoscopy N/A 09/14/2013    Procedure: COLONOSCOPY;  Surgeon: Rogene Houston, MD;  [facility];  Service: Endoscopy;  Laterality: N/A;  830  . Left heart catheterization with coronary angiogram N/A 07/04/2011    Procedure: LEFT HEART CATHETERIZATION WITH CORONARY ANGIOGRAM;  Surgeon: [name], MD;  Location: [facility] CATH LAB;  Service: Cardiovascular;  Laterality: N/A;    Current Outpatient Prescriptions  Medication Sig Dispense Refill  . aspirin EC 81 MG tablet Take 81 mg by mouth daily.    Marland Kitchen atorvastatin (LIPITOR) 20 MG tablet Take 1 tablet (20 mg total) by mouth daily at 6 PM. 30 tablet 1  . Empagliflozin (JARDIANCE) 10 MG TABS Take 10 mg by mouth daily.    . furosemide (LASIX) 20 MG tablet Take 20 mg by mouth daily.     Marland Kitchen glipiZIDE (GLUCOTROL XL) 10 MG 24 hr tablet Take 10 mg by mouth 2 (two) times daily.    Marland Kitchen levothyroxine (SYNTHROID, LEVOTHROID) 25 MCG tablet Take 1 tablet by mouth Daily.    Marland Kitchen  metoprolol tartrate (LOPRESSOR) 25 MG tablet Take 1 tablet (25 mg total) by mouth 2 (two) times daily. 60 tablet 1  . potassium chloride (K-DUR,KLOR-CON) 10 MEQ tablet Take 10 mEq by mouth daily.    . raloxifene (EVISTA) 60 MG tablet Take 60 mg by mouth daily.    . saxagliptin HCl (ONGLYZA) 5 MG TABS tablet Take 5 mg by mouth daily.    . vitamin C (ASCORBIC ACID) 500 MG tablet Take 500 mg by mouth daily.    . Vitamin D, Ergocalciferol, (DRISDOL) 50000 UNITS CAPS Take 50,000 Units by mouth every 7 (seven) days. mondays     No current facility-administered medications for this visit.    Allergies:  Amiodarone   Social History: The patient  reports that she quit  smoking about 25 years ago. Her smoking use included Cigarettes. She has a 20 pack-year smoking history. She has never used smokeless tobacco. She reports that she does not drink alcohol or use illicit drugs.   ROS:  Please see the history of present illness. Otherwise, complete review of systems is positive for none.  All other systems are reviewed and negative.    Physical Exam: VS:  BP 127/76 mmHg  Pulse 81  Ht 4\' 10"  (1.473 m)  Wt 171 lb 12.8 oz (77.928 kg)  BMI 35.92 kg/m2  SpO2 94%, BMI Body mass index is 35.92 kg/(m^2).  Wt Readings from Last 3 Encounters:  06/29/14 171 lb 12.8 oz (77.928 kg)  09/14/13 179 lb (81.194 kg)  04/22/13 171 lb (77.565 kg)     Short statured, overweight woman, appears comfortable at rest. HEENT: Conjunctiva and lids normal, oropharynx clear.  Neck: Supple, no elevated JVP or carotid bruits, no thyromegaly.  Lungs: Clear to auscultation, nonlabored breathing at rest.  Cardiac: Regular rate and rhythm, no S3, 99991111 systolic murmur, no pericardial rub.  Abdomen: Soft, nontender, bowel sounds present.  Extremities: Chronic appearing mild edema/lymphedema, with compression stockings. Skin: Warm and dry.   ECG: ECG is ordered today and reviewed showing sinus rhythm with left anterior fascicular block.   Other Studies Reviewed Today:  Echocardiogram 11/19/2011: Study Conclusions  - Left ventricle: The cavity size was normal. Wall thickness was increased in a pattern of moderate LVH. Systolic function was normal. The estimated ejection fraction was in the range of 55% to 60%. Wall motion was normal; there were no regional wall motion abnormalities. - Aortic valve: A 72mmbioprosthesis was present but poorly visualized. There was no stenosis. - Left atrium: The atrium was mildly dilated.   Assessment and Plan:  1. Symptomatically stable with CAD status post CABG, ECG reviewed. No changes in current medical regimen.  2. Status  post septal myomectomy and pericardial AVR. Echocardiogram from 2013 reviewed above. No change on examination.  3. Essential hypertension, blood pressure is controlled today.  Current medicines are reviewed at length with the patient today.    Orders Placed This Encounter  Procedures  . EKG 12-Lead    Disposition: FU with me in 1 year.   Signed, Satira Sark, MD, [facility] 06/29/2014 3:56 PM    [facility] [place], [place], [place] [address] Phone: [phone]; Fax: [phone]

## 2015-08-06 DIAGNOSIS — Z7189 Other specified counseling: Secondary | ICD-10-CM | POA: Diagnosis not present

## 2015-08-06 DIAGNOSIS — Z79899 Other long term (current) drug therapy: Secondary | ICD-10-CM | POA: Diagnosis not present

## 2015-08-06 DIAGNOSIS — R5383 Other fatigue: Secondary | ICD-10-CM | POA: Diagnosis not present

## 2015-08-06 DIAGNOSIS — Z0000 Encounter for general adult medical examination without abnormal findings: Secondary | ICD-10-CM | POA: Diagnosis not present

## 2015-08-06 DIAGNOSIS — E7800 Pure hypercholesterolemia, unspecified: Secondary | ICD-10-CM | POA: Diagnosis not present

## 2015-08-06 DIAGNOSIS — Z1389 Encounter for screening for other disorder: Secondary | ICD-10-CM | POA: Diagnosis not present

## 2015-08-06 DIAGNOSIS — Z6841 Body Mass Index (BMI) 40.0 and over, adult: Secondary | ICD-10-CM | POA: Diagnosis not present

## 2015-08-06 DIAGNOSIS — Z299 Encounter for prophylactic measures, unspecified: Secondary | ICD-10-CM | POA: Diagnosis not present

## 2015-08-06 DIAGNOSIS — E1165 Type 2 diabetes mellitus with hyperglycemia: Secondary | ICD-10-CM | POA: Diagnosis not present

## 2015-10-09 DIAGNOSIS — M810 Age-related osteoporosis without current pathological fracture: Secondary | ICD-10-CM | POA: Diagnosis not present

## 2015-11-28 DIAGNOSIS — Z6841 Body Mass Index (BMI) 40.0 and over, adult: Secondary | ICD-10-CM | POA: Diagnosis not present

## 2015-11-28 DIAGNOSIS — Z789 Other specified health status: Secondary | ICD-10-CM | POA: Diagnosis not present

## 2015-11-28 DIAGNOSIS — E1165 Type 2 diabetes mellitus with hyperglycemia: Secondary | ICD-10-CM | POA: Diagnosis not present

## 2015-11-28 DIAGNOSIS — I10 Essential (primary) hypertension: Secondary | ICD-10-CM | POA: Diagnosis not present

## 2015-11-28 DIAGNOSIS — I4891 Unspecified atrial fibrillation: Secondary | ICD-10-CM | POA: Diagnosis not present

## 2015-12-11 DIAGNOSIS — E119 Type 2 diabetes mellitus without complications: Secondary | ICD-10-CM | POA: Diagnosis not present

## 2015-12-11 DIAGNOSIS — H52223 Regular astigmatism, bilateral: Secondary | ICD-10-CM | POA: Diagnosis not present

## 2015-12-11 DIAGNOSIS — H5203 Hypermetropia, bilateral: Secondary | ICD-10-CM | POA: Diagnosis not present

## 2015-12-11 DIAGNOSIS — Z7984 Long term (current) use of oral hypoglycemic drugs: Secondary | ICD-10-CM | POA: Diagnosis not present

## 2015-12-14 ENCOUNTER — Encounter: Payer: Self-pay | Admitting: Cardiology

## 2015-12-14 ENCOUNTER — Ambulatory Visit (INDEPENDENT_AMBULATORY_CARE_PROVIDER_SITE_OTHER): Payer: Medicare Other | Admitting: Cardiology

## 2015-12-14 VITALS — BP 128/80 | HR 80 | Ht <= 58 in | Wt 182.0 lb

## 2015-12-14 DIAGNOSIS — Z952 Presence of prosthetic heart valve: Secondary | ICD-10-CM

## 2015-12-14 DIAGNOSIS — Z954 Presence of other heart-valve replacement: Secondary | ICD-10-CM

## 2015-12-14 DIAGNOSIS — I10 Essential (primary) hypertension: Secondary | ICD-10-CM

## 2015-12-14 DIAGNOSIS — I4891 Unspecified atrial fibrillation: Secondary | ICD-10-CM

## 2015-12-14 DIAGNOSIS — I2510 Atherosclerotic heart disease of native coronary artery without angina pectoris: Secondary | ICD-10-CM

## 2015-12-14 DIAGNOSIS — I9789 Other postprocedural complications and disorders of the circulatory system, not elsewhere classified: Secondary | ICD-10-CM

## 2015-12-14 NOTE — Progress Notes (Signed)
Cardiology Office Note  Date: 12/14/2015   ID: Melinda Vang, DOB 02/21/1946, MRN [id number]  PCP: Glenda Chroman, MD  Primary Cardiologist: Rozann Lesches, MD   Chief Complaint  Patient presents with  . Status post AVR  . Coronary Artery Disease    History of Present Illness: Melinda Vang is a 70 y.o. female last seen in March 2016. She is here with a family member today. Remains fairly sedentary, no obvious change in stamina. NYHA class II dyspnea with typical activities, no chest pain.  Last echocardiogram was in 2013 as outlined below. Bioprosthetic AVR was functioning normally based on limited images. She is due for a follow-up study. CABG with bioprosthetic AVR was in 2013.  I reviewed her ECG today which shows sinus rhythm with R' in lead V1.  Past Medical History:  Diagnosis Date  . Anxiety   . Developmental delay   . Essential hypertension, benign   . GERD (gastroesophageal reflux disease)   . Gout   . History of anemia   . Peripheral vascular disease ([place])   . Postoperative atrial fibrillation ([facility])    Initially on amiodarone but discontinued due to severe nausea.  . Rectal cancer ([place])   . S/P AVR (aortic valve replacement) 07/05/2011   19 mm [facility] Ease pericardial tissue valve with septal myomectomy  . 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 Status post myomectomy 07/05/2011  . Type 2 diabetes mellitus ([place])     Past Surgical History:  Procedure Laterality Date  . ABDOMINAL HYSTERECTOMY    . AORTIC VALVE REPLACEMENT  07/04/2011   Procedure: AORTIC VALVE REPLACEMENT (AVR);  Surgeon: Rexene Alberts, MD;  Location: [place];  Service: Open Heart Surgery;  Laterality: N/A;  . COLONOSCOPY N/A 09/14/2013   Procedure: COLONOSCOPY;  Surgeon: Rogene Houston, MD;  [facility];  Service: Endoscopy;  Laterality: N/A;  830  . COLONOSCOPY W/ BIOPSIES AND POLYPECTOMY    . CORONARY ARTERY BYPASS GRAFT  07/04/2011   Procedure: CORONARY ARTERY BYPASS GRAFTING (CABG);  Surgeon: Rexene Alberts, MD;  Location: [place];  Service: Open Heart Surgery;  Laterality: N/A;  four grafts using bilateral leg greater saphenous vein harvested endoscopically.  Marland Kitchen LEFT HEART CATHETERIZATION WITH CORONARY ANGIOGRAM N/A 07/04/2011   Procedure: LEFT HEART CATHETERIZATION WITH CORONARY ANGIOGRAM;  Surgeon: [name], MD;  Location: [facility] CATH LAB;  Service: Cardiovascular;  Laterality: N/A;  . LOW ANTERIOR BOWEL RESECTION  2011   rectal cancer  . MYOMECTOMY  07/04/2011   Procedure: MYOMECTOMY;  Surgeon: Rexene Alberts, MD;  Location: [place];  Service: Open Heart Surgery;  Laterality: N/A;  septal    Current Outpatient Prescriptions  Medication Sig Dispense Refill  . aspirin EC 81 MG tablet Take 81 mg by mouth daily.    Marland Kitchen atorvastatin (LIPITOR) 20 MG tablet Take 1 tablet (20 mg total) by mouth daily at 6 PM. 30 tablet 1  . Empagliflozin (JARDIANCE) 10 MG TABS Take 10 mg by mouth daily.    . furosemide (LASIX) 20 MG tablet Take 20 mg by mouth daily.     Marland Kitchen glipiZIDE (GLUCOTROL XL) 10 MG 24 hr tablet Take 10 mg by mouth 2 (two) times daily.    Marland Kitchen levothyroxine (SYNTHROID, LEVOTHROID) 25 MCG tablet Take 1 tablet by mouth Daily.    . metoprolol tartrate (LOPRESSOR) 25 MG tablet Take 1 tablet (25  mg total) by mouth 2 (two) times daily. 60 tablet 1  . potassium chloride (K-DUR,KLOR-CON) 10 MEQ tablet Take 10 mEq by mouth daily.    . raloxifene (EVISTA) 60 MG tablet Take 60 mg by mouth daily.    . saxagliptin HCl (ONGLYZA) 5 MG TABS tablet Take 5 mg by mouth daily.    . vitamin C (ASCORBIC ACID) 500 MG tablet Take 500 mg by mouth daily.    . Vitamin D, Ergocalciferol, (DRISDOL) 50000 UNITS CAPS Take 50,000 Units by mouth every 7 (seven) days. mondays     No current facility-administered medications for this visit.    Allergies:  Amiodarone   Social History: The patient  reports that she quit smoking about 26 years ago. Her smoking  use included Cigarettes. She has a 20.00 pack-year smoking history. She has never used smokeless tobacco. She reports that she does not drink alcohol or use drugs.   ROS:  Please see the history of present illness. Otherwise, complete review of systems is positive for chronic fatigue.  All other systems are reviewed and negative.   Physical Exam: VS:  BP 128/80   Pulse 80   Ht 4\' 10"  (1.473 m)   Wt 182 lb (82.6 kg)   SpO2 95%   BMI 38.04 kg/m , BMI Body mass index is 38.04 kg/m.  Wt Readings from Last 3 Encounters:  12/14/15 182 lb (82.6 kg)  06/29/14 171 lb 12.8 oz (77.9 kg)  09/14/13 179 lb (81.2 kg)    Short statured, overweight woman, appears comfortable at rest. HEENT: Conjunctiva and lids normal, oropharynx clear.  Neck: Supple, no elevated JVP or carotid bruits, no thyromegaly.  Lungs: Clear to auscultation, nonlabored breathing at rest.  Cardiac: Regular rate and rhythm, no S3, 3/6 systolic murmur, no pericardial rub.  Abdomen: Soft, nontender, bowel sounds present.  Extremities: Chronic appearing mild edema/lymphedema, with compression stockings. Skin: Warm and dry. Musculoskeletal: No kyphosis.  Neuropsychiatric: Alert and oriented 3, pleasant.  ECG: I personally reviewed the tracing from 06/29/2014 which showed sinus rhythm with left anterior fascicular block and decreased R wave progression.  Other Studies Reviewed Today:  Echocardiogram 11/19/2011: Study Conclusions  - Left ventricle: The cavity size was normal. Wall thickness was increased in a pattern of moderate LVH. Systolic function was normal. The estimated ejection fraction was in the range of 55% to 60%. Wall motion was normal; there were no regional wall motion abnormalities. - Aortic valve: A 63mmbioprosthesis was present but poorly visualized. There was no stenosis. - Left atrium: The atrium was mildly dilated.  Assessment and Plan:  1. Aortic valve disease status post  bioprosthetic AVR with septal myomectomy in 2013 at time of CABG. Last echocardiogram was in 2013 postoperatively. We will obtain a follow-up study to ensure stability. Still has reasonably prominent systolic murmur on examination but no obvious change in symptoms with relatively sedentary state.  2. Essential hypertension, blood pressure is reasonably well controlled today.  3. Multivessel CAD status post CABG in 2013, no active angina symptoms. ECG reviewed. She continues on aspirin, beta blocker, and statin.  4. Postoperative atrial fibrillation, no obvious recurrences.  Current medicines were reviewed with the patient today.   Orders Placed This Encounter  Procedures  . EKG 12-Lead  . ECHOCARDIOGRAM COMPLETE    Disposition: Follow-up with me in one year.  Signed, Satira Sark, MD, [facility] 12/14/2015 3:23 PM    [facility] [place], [place], [place] [address]  Phone: [phone]; Fax: [phone]

## 2015-12-14 NOTE — Patient Instructions (Signed)

## 2016-01-02 DIAGNOSIS — N6489 Other specified disorders of breast: Secondary | ICD-10-CM | POA: Diagnosis not present

## 2016-01-02 DIAGNOSIS — R928 Other abnormal and inconclusive findings on diagnostic imaging of breast: Secondary | ICD-10-CM | POA: Diagnosis not present

## 2016-01-16 ENCOUNTER — Other Ambulatory Visit: Payer: Medicare Other

## 2016-01-16 ENCOUNTER — Ambulatory Visit (INDEPENDENT_AMBULATORY_CARE_PROVIDER_SITE_OTHER): Payer: Medicare Other

## 2016-01-16 ENCOUNTER — Telehealth: Payer: Self-pay | Admitting: *Deleted

## 2016-01-16 ENCOUNTER — Other Ambulatory Visit: Payer: Self-pay

## 2016-01-16 DIAGNOSIS — Z952 Presence of prosthetic heart valve: Secondary | ICD-10-CM | POA: Diagnosis not present

## 2016-01-16 DIAGNOSIS — I2510 Atherosclerotic heart disease of native coronary artery without angina pectoris: Secondary | ICD-10-CM

## 2016-01-16 LAB — ECHOCARDIOGRAM COMPLETE
AO mean calculated velocity dopler: 304 cm/s
AV Peak grad: 76 mmHg
AV pk vel: 435 cm/s
AVCELMEANRAT: 0.28
AVG: 43 mmHg
AVLVOTPG: 7 mmHg
Ao pk vel: 0.3 m/s
CHL CUP DOP CALC LVOT VTI: 25 cm
CHL CUP TV REG PEAK VELOCITY: 299 cm/s
E decel time: 222 msec
EERAT: 13
FS: 62 % — AB (ref 28–44)
IVS/LV PW RATIO, ED: 0.78
LA ID, A-P, ES: 42 mm
LA diam end sys: 42 mm
LA diam index: 2.4 cm/m2
LA vol A4C: 44.8 ml
LA vol: 48.2 mL
LAVOLIN: 27.5 mL/m2
LV E/e' medial: 13
LV E/e'average: 13
LV PW d: 12.1 mm — AB (ref 0.6–1.1)
LV SIMPSON'S DISK: 81
LV dias vol index: 46 mL/m2
LV dias vol: 80 mL (ref 46–106)
LV sys vol index: 9 mL/m2
LV sys vol: 16 mL (ref 14–42)
LVELAT: 10 cm/s
LVOT peak VTI: 0.32 cm
LVOT peak vel: 129 cm/s
MV Dec: 222
MV pk A vel: 132 m/s
MVPG: 7 mmHg
MVPKEVEL: 130 m/s
RV sys press: 39 mmHg
Stroke v: 65 ml
TAPSE: 18.8 mm
TDI e' lateral: 10
TDI e' medial: 4.53
TR max vel: 299 cm/s
VTI: 77.3 cm

## 2016-01-16 NOTE — Telephone Encounter (Signed)
-----   Message from Satira Sark, MD sent at 01/16/2016  3:56 PM EDT ----- Results reviewed. I reviewed the images. Transaortic valve gradient is more elevated than one would expect with a bioprosthetic aortic valve, however measurements look to be also affected by prosthesis size and narrow LVOT. The limited view of the aortic leaflets in the parasternal long axis shows relatively normal motion. In light of no progressive symptoms, would continue with observation. Can keep an eye on this going forward. A copy of this test should be forwarded to Glenda Chroman, MD.

## 2016-01-17 NOTE — Telephone Encounter (Signed)
Will be at work until 1.  At home from 1-2 for lunch  [phone]

## 2016-01-23 NOTE — Telephone Encounter (Signed)
Floyde Parkins informed and copy sent to PCP.

## 2016-04-23 DIAGNOSIS — R079 Chest pain, unspecified: Secondary | ICD-10-CM | POA: Diagnosis not present

## 2016-04-23 DIAGNOSIS — Z6838 Body mass index (BMI) 38.0-38.9, adult: Secondary | ICD-10-CM | POA: Diagnosis not present

## 2016-04-23 DIAGNOSIS — Z299 Encounter for prophylactic measures, unspecified: Secondary | ICD-10-CM | POA: Diagnosis not present

## 2016-04-23 DIAGNOSIS — Z951 Presence of aortocoronary bypass graft: Secondary | ICD-10-CM | POA: Diagnosis not present

## 2016-04-23 DIAGNOSIS — I2510 Atherosclerotic heart disease of native coronary artery without angina pectoris: Secondary | ICD-10-CM | POA: Diagnosis not present

## 2016-04-23 DIAGNOSIS — E1165 Type 2 diabetes mellitus with hyperglycemia: Secondary | ICD-10-CM | POA: Diagnosis not present

## 2016-04-23 DIAGNOSIS — Z87891 Personal history of nicotine dependence: Secondary | ICD-10-CM | POA: Diagnosis not present

## 2016-04-23 DIAGNOSIS — I214 Non-ST elevation (NSTEMI) myocardial infarction: Secondary | ICD-10-CM | POA: Diagnosis not present

## 2016-04-24 DIAGNOSIS — E1122 Type 2 diabetes mellitus with diabetic chronic kidney disease: Secondary | ICD-10-CM | POA: Diagnosis present

## 2016-04-24 DIAGNOSIS — Z23 Encounter for immunization: Secondary | ICD-10-CM | POA: Diagnosis not present

## 2016-04-24 DIAGNOSIS — Z7984 Long term (current) use of oral hypoglycemic drugs: Secondary | ICD-10-CM | POA: Diagnosis not present

## 2016-04-24 DIAGNOSIS — M810 Age-related osteoporosis without current pathological fracture: Secondary | ICD-10-CM | POA: Diagnosis present

## 2016-04-24 DIAGNOSIS — N183 Chronic kidney disease, stage 3 (moderate): Secondary | ICD-10-CM | POA: Diagnosis present

## 2016-04-24 DIAGNOSIS — Z79899 Other long term (current) drug therapy: Secondary | ICD-10-CM | POA: Diagnosis not present

## 2016-04-24 DIAGNOSIS — E860 Dehydration: Secondary | ICD-10-CM | POA: Diagnosis present

## 2016-04-24 DIAGNOSIS — Z85048 Personal history of other malignant neoplasm of rectum, rectosigmoid junction, and anus: Secondary | ICD-10-CM | POA: Diagnosis not present

## 2016-04-24 DIAGNOSIS — I214 Non-ST elevation (NSTEMI) myocardial infarction: Secondary | ICD-10-CM | POA: Diagnosis present

## 2016-04-24 DIAGNOSIS — F71 Moderate intellectual disabilities: Secondary | ICD-10-CM | POA: Diagnosis present

## 2016-04-24 DIAGNOSIS — Z952 Presence of prosthetic heart valve: Secondary | ICD-10-CM | POA: Diagnosis not present

## 2016-04-24 DIAGNOSIS — I2510 Atherosclerotic heart disease of native coronary artery without angina pectoris: Secondary | ICD-10-CM | POA: Diagnosis not present

## 2016-04-24 DIAGNOSIS — Z888 Allergy status to other drugs, medicaments and biological substances status: Secondary | ICD-10-CM | POA: Diagnosis not present

## 2016-04-24 DIAGNOSIS — N289 Disorder of kidney and ureter, unspecified: Secondary | ICD-10-CM | POA: Diagnosis present

## 2016-04-24 DIAGNOSIS — J40 Bronchitis, not specified as acute or chronic: Secondary | ICD-10-CM | POA: Diagnosis present

## 2016-04-24 DIAGNOSIS — R079 Chest pain, unspecified: Secondary | ICD-10-CM | POA: Diagnosis not present

## 2016-04-24 DIAGNOSIS — Z7982 Long term (current) use of aspirin: Secondary | ICD-10-CM | POA: Diagnosis not present

## 2016-04-24 DIAGNOSIS — I129 Hypertensive chronic kidney disease with stage 1 through stage 4 chronic kidney disease, or unspecified chronic kidney disease: Secondary | ICD-10-CM | POA: Diagnosis present

## 2016-04-24 DIAGNOSIS — Z951 Presence of aortocoronary bypass graft: Secondary | ICD-10-CM | POA: Diagnosis not present

## 2016-04-29 ENCOUNTER — Encounter: Payer: Self-pay | Admitting: *Deleted

## 2016-04-30 ENCOUNTER — Encounter: Payer: Medicare Other | Admitting: Cardiology

## 2016-05-06 ENCOUNTER — Encounter: Payer: Medicare Other | Admitting: Adult Health

## 2016-05-08 ENCOUNTER — Encounter: Payer: Self-pay | Admitting: Adult Health

## 2016-05-08 ENCOUNTER — Ambulatory Visit (INDEPENDENT_AMBULATORY_CARE_PROVIDER_SITE_OTHER): Payer: Medicare Other | Admitting: Adult Health

## 2016-05-08 VITALS — BP 140/62 | HR 65 | Ht <= 58 in | Wt 183.0 lb

## 2016-05-08 DIAGNOSIS — Z952 Presence of prosthetic heart valve: Secondary | ICD-10-CM | POA: Diagnosis not present

## 2016-05-08 DIAGNOSIS — I2510 Atherosclerotic heart disease of native coronary artery without angina pectoris: Secondary | ICD-10-CM | POA: Diagnosis not present

## 2016-05-08 NOTE — Progress Notes (Signed)
Cardiology Office Note   Date:  05/08/2016   ID:  Melinda Vang, DOB [birth year], MRN [id number]  PCP:  Glenda Chroman, MD  Cardiologist: [place]/  Jory Sims, NP   Chief Complaint  Patient presents with  . Coronary Artery Disease  . Shortness of Breath      History of Present Illness: Melinda Vang is a 71 y.o. female who presents for ongoing assessment and management of coronary artery disease, history of CABG, postoperative atrial fibrillation, New York Heart Association class II dyspnea, status post bioprosthetic aortic valve, most recent echocardiogram in 2013 revealed normally functioning aortic valve. The patient was last seen by Dr. Domenic Polite on 12/14/2015. The patient was asymptomatic and relatively sedentary at home. Blood pressure was well-controlled. No medications were changed.  She has recently been discharged from [facility] within the last week. She was admitted there for bronchitis, and possible CHF. She has no understanding of what she was treated with and also if she had any testing. Her caregiver says that she did have an echo, but she does not know the results, nor does she know if she had medications adjusted. We have no records at this time to review.   She is asymptomatic presently. No coughing, significant dyspnea, or edema. She is medically compliant with the exception of lasix, because she had appointment today. Usually takes it with good results.   Past Medical History:  Diagnosis Date  . Anxiety   . Developmental delay   . Essential hypertension, benign   . GERD (gastroesophageal reflux disease)   . Gout   . History of anemia   . Peripheral vascular disease ([place])   . Postoperative atrial fibrillation (HCC)    Initially on amiodarone but discontinued due to severe nausea.  . Rectal cancer ([place])   . S/P AVR (aortic valve replacement) 07/05/2011   19 mm [facility] Ease pericardial tissue valve with septal myomectomy  . S/P CABG x 4  07/04/2011   SVG to LAD, SVG to OM and LPLB, SVG to LPDA,  . Status post myomectomy 07/05/2011  . Type 2 diabetes mellitus ([place])     Past Surgical History:  Procedure Laterality Date  . ABDOMINAL HYSTERECTOMY    . AORTIC VALVE REPLACEMENT  07/04/2011   Procedure: AORTIC VALVE REPLACEMENT (AVR);  Surgeon: Rexene Alberts, MD;  Location: [place];  Service: Open Heart Surgery;  Laterality: N/A;  . COLONOSCOPY N/A 09/14/2013   Procedure: COLONOSCOPY;  Surgeon: Rogene Houston, MD;  [facility];  Service: Endoscopy;  Laterality: N/A;  830  . COLONOSCOPY W/ BIOPSIES AND POLYPECTOMY    . CORONARY ARTERY BYPASS GRAFT  07/04/2011   Procedure: CORONARY ARTERY BYPASS GRAFTING (CABG);  Surgeon: Rexene Alberts, MD;  Location: [place];  Service: Open Heart Surgery;  Laterality: N/A;  four grafts using bilateral leg greater saphenous vein harvested endoscopically.  Marland Kitchen LEFT HEART CATHETERIZATION WITH CORONARY ANGIOGRAM N/A 07/04/2011   Procedure: LEFT HEART CATHETERIZATION WITH CORONARY ANGIOGRAM;  Surgeon: [name], MD;  Location: [facility] CATH LAB;  Service: Cardiovascular;  Laterality: N/A;  . LOW ANTERIOR BOWEL RESECTION  2011   rectal cancer  . MYOMECTOMY  07/04/2011   Procedure: MYOMECTOMY;  Surgeon: Rexene Alberts, MD;  Location: [place];  Service: Open Heart Surgery;  Laterality: N/A;  septal     Current Outpatient Prescriptions  Medication Sig Dispense Refill  . aspirin EC 81 MG tablet Take 81 mg by mouth daily.    Marland Kitchen  atorvastatin (LIPITOR) 20 MG tablet Take 1 tablet (20 mg total) by mouth daily at 6 PM. 30 tablet 1  . Empagliflozin (JARDIANCE) 10 MG TABS Take 10 mg by mouth daily.    . furosemide (LASIX) 20 MG tablet Take 20 mg by mouth daily.     Marland Kitchen glipiZIDE (GLUCOTROL XL) 10 MG 24 hr tablet Take 10 mg by mouth 2 (two) times daily.    Marland Kitchen levothyroxine (SYNTHROID, LEVOTHROID) 25 MCG tablet Take 1 tablet by mouth Daily.    . metoprolol tartrate (LOPRESSOR) 25 MG tablet Take 1 tablet (25 mg  total) by mouth 2 (two) times daily. 60 tablet 1  . potassium chloride (K-DUR,KLOR-CON) 10 MEQ tablet Take 10 mEq by mouth daily.    . raloxifene (EVISTA) 60 MG tablet Take 60 mg by mouth daily.    . saxagliptin HCl (ONGLYZA) 5 MG TABS tablet Take 5 mg by mouth daily.    . vitamin C (ASCORBIC ACID) 500 MG tablet Take 500 mg by mouth daily.    . Vitamin D, Ergocalciferol, (DRISDOL) 50000 UNITS CAPS Take 50,000 Units by mouth every 7 (seven) days. mondays     No current facility-administered medications for this visit.     Allergies:   Amiodarone    Social History:  The patient  reports that she quit smoking about 27 years ago. Her smoking use included Cigarettes. She has a 20.00 pack-year smoking history. She has never used smokeless tobacco. She reports that she does not drink alcohol or use drugs.   Family History:  The patient's family history includes Diabetes in her sister; Heart attack in her father; Heart failure in her mother; Hypertension in her sister; Stroke in her mother.    ROS: All other systems are reviewed and negative. Unless otherwise mentioned in H&P    PHYSICAL EXAM: VS:  BP 140/62   Pulse 65   Ht 4\' 10"  (1.473 m)   Wt 183 lb (83 kg)   SpO2 93%   BMI 38.25 kg/m  , BMI Body mass index is 38.25 kg/m. GEN: Well nourished, well developed, in no acute distress  HEENT: normal  Neck: no JVD, carotid bruits, or masses Cardiac: RRR; 2/6 holosystolic murmur,  rubs, or gallops,no edema  Respiratory: Poor inspiratory effort. No wheezes or crackles on exam.  MS: no deformity or atrophy  Skin: warm and dry, no rash Neuro:  Strength and sensation are intact Psych: euthymic mood, full affect   Recent Labs: No results found for requested labs within last 8760 hours.    Lipid Panel    Component Value Date/Time   CHOL 200 07/04/2011 0600   TRIG 257 (H) 07/04/2011 0600   HDL 48 07/04/2011 0600   CHOLHDL 4.2 07/04/2011 0600   VLDL 51 (H) 07/04/2011 0600   LDLCALC  101 (H) 07/04/2011 0600      Wt Readings from Last 3 Encounters:  05/08/16 183 lb (83 kg)  12/14/15 182 lb (82.6 kg)  06/29/14 171 lb 12.8 oz (77.9 kg)     ASSESSMENT AND PLAN:  1.  CAD: No complaints of recurrent chest pain since discharge from [facility]. Will request the records to review concerning labs and echo if done. For now she is stable. Will continue medications unless new information warrants changes.   2. S/P AoV repair: Holosystolic murmur is auscultated. Will review recent echo from [place]. She has chronic mild dyspnea, thought to be related to deconditioning.   3. Recent hospitalization for Bronchitis: Will request  records.    Current medicines are reviewed at length with the patient today.    Labs/ tests ordered today include:  No orders of the defined types were placed in this encounter.    Disposition:   FU with 3 months in [place] with Dr. Domenic Polite  Signed, Jory Sims, NP  05/08/2016 3:56 PM    [place]. [address] [place], [place] [address] Phone: [phone]; Fax: [phone]

## 2016-05-08 NOTE — Progress Notes (Signed)
Name: Melinda Vang    DOB: 27-Jan-1946  Age: 71 y.o.  MR#: [id number]       PCP:  Glenda Chroman, MD      Insurance: Payor: MEDICARE / Plan: MEDICARE PART A AND B / Product Type: *No Product type* /   CC:   No chief complaint on file.   VS Vitals:   05/08/16 1513  BP: 140/62  Pulse: 65  SpO2: 93%  Weight: 183 lb (83 kg)  Height: 4\' 10"  (1.473 m)    Weights Current Weight  05/08/16 183 lb (83 kg)  12/14/15 182 lb (82.6 kg)  06/29/14 171 lb 12.8 oz (77.9 kg)    Blood Pressure  BP Readings from Last 3 Encounters:  05/08/16 140/62  12/14/15 128/80  06/29/14 127/76     Admit date:  (Not on file) Last encounter with RMR:  Visit date not found   Allergy Amiodarone  Current Outpatient Prescriptions  Medication Sig Dispense Refill  . aspirin EC 81 MG tablet Take 81 mg by mouth daily.    Marland Kitchen atorvastatin (LIPITOR) 20 MG tablet Take 1 tablet (20 mg total) by mouth daily at 6 PM. 30 tablet 1  . Empagliflozin (JARDIANCE) 10 MG TABS Take 10 mg by mouth daily.    . furosemide (LASIX) 20 MG tablet Take 20 mg by mouth daily.     Marland Kitchen glipiZIDE (GLUCOTROL XL) 10 MG 24 hr tablet Take 10 mg by mouth 2 (two) times daily.    Marland Kitchen levothyroxine (SYNTHROID, LEVOTHROID) 25 MCG tablet Take 1 tablet by mouth Daily.    . metoprolol tartrate (LOPRESSOR) 25 MG tablet Take 1 tablet (25 mg total) by mouth 2 (two) times daily. 60 tablet 1  . potassium chloride (K-DUR,KLOR-CON) 10 MEQ tablet Take 10 mEq by mouth daily.    . raloxifene (EVISTA) 60 MG tablet Take 60 mg by mouth daily.    . saxagliptin HCl (ONGLYZA) 5 MG TABS tablet Take 5 mg by mouth daily.    . vitamin C (ASCORBIC ACID) 500 MG tablet Take 500 mg by mouth daily.    . Vitamin D, Ergocalciferol, (DRISDOL) 50000 UNITS CAPS Take 50,000 Units by mouth every 7 (seven) days. mondays     No current facility-administered medications for this visit.     Discontinued Meds:   There are no discontinued medications.  Patient Active Problem List   Diagnosis Date Noted  . Coronary atherosclerosis of native coronary artery 05/20/2012  . Postoperative atrial fibrillation ([place])   . S/P myomectomy 08/08/2011  . S/P AVR (aortic valve replacement) 07/05/2011  . Dyslipidemia 07/04/2011  . S/P CABG x 4 07/04/2011    LABS    Component Value Date/Time   NA 135 07/16/2011 0532   NA 142 07/13/2011 0500   NA 139 07/12/2011 0800   K 4.2 07/16/2011 0532   K 4.3 07/13/2011 0500   K 4.2 07/12/2011 0800   CL 94 (L) 07/16/2011 0532   CL 101 07/13/2011 0500   CL 97 07/12/2011 0800   CO2 27 07/16/2011 0532   CO2 31 07/13/2011 0500   CO2 29 07/12/2011 0800   GLUCOSE 164 (H) 07/16/2011 0532   GLUCOSE 114 (H) 07/13/2011 0500   GLUCOSE 137 (H) 07/12/2011 0800   BUN 32 (H) 07/16/2011 0532   BUN 31 (H) 07/13/2011 0500   BUN 30 (H) 07/12/2011 0800   CREATININE 1.34 (H) 07/16/2011 0532   CREATININE 1.30 (H) 07/13/2011 0500   CREATININE 1.26 (H) 07/12/2011 0800  CALCIUM 9.0 07/16/2011 0532   CALCIUM 9.0 07/13/2011 0500   CALCIUM 9.4 07/12/2011 0800   GFRNONAA 40 (L) 07/16/2011 0532   GFRNONAA 41 (L) 07/13/2011 0500   GFRNONAA 43 (L) 07/12/2011 0800   GFRAA 46 (L) 07/16/2011 0532   GFRAA 48 (L) 07/13/2011 0500   GFRAA 50 (L) 07/12/2011 0800   CMP     Component Value Date/Time   NA 135 07/16/2011 0532   K 4.2 07/16/2011 0532   CL 94 (L) 07/16/2011 0532   CO2 27 07/16/2011 0532   GLUCOSE 164 (H) 07/16/2011 0532   BUN 32 (H) 07/16/2011 0532   CREATININE 1.34 (H) 07/16/2011 0532   CALCIUM 9.0 07/16/2011 0532   PROT 5.5 (L) 07/03/2011 1250   ALBUMIN 2.8 (L) 07/03/2011 1250   AST 28 07/03/2011 1250   ALT 14 07/03/2011 1250   ALKPHOS 64 07/03/2011 1250   BILITOT 0.3 07/03/2011 1250   GFRNONAA 40 (L) 07/16/2011 0532   GFRAA 46 (L) 07/16/2011 0532       Component Value Date/Time   WBC 10.4 07/10/2011 0450   WBC 10.1 07/08/2011 0330   WBC 11.8 (H) 07/07/2011 0327   HGB 10.3 (L) 07/10/2011 0450   HGB 10.2 (L) 07/08/2011 0330   HGB  10.2 (L) 07/07/2011 0327   HCT 32.2 (L) 07/10/2011 0450   HCT 30.8 (L) 07/08/2011 0330   HCT 30.7 (L) 07/07/2011 0327   MCV 94.4 07/10/2011 0450   MCV 91.7 07/08/2011 0330   MCV 90.6 07/07/2011 0327    Lipid Panel     Component Value Date/Time   CHOL 200 07/04/2011 0600   TRIG 257 (H) 07/04/2011 0600   HDL 48 07/04/2011 0600   CHOLHDL 4.2 07/04/2011 0600   VLDL 51 (H) 07/04/2011 0600   LDLCALC 101 (H) 07/04/2011 0600    ABG    Component Value Date/Time   PHART 7.366 07/05/2011 1322   PCO2ART 36.0 07/05/2011 1322   PO2ART 127.0 (H) 07/05/2011 1322   HCO3 20.5 07/05/2011 1322   TCO2 19 07/05/2011 1704   ACIDBASEDEF 4.0 (H) 07/05/2011 1322   O2SAT 99.0 07/05/2011 1322     Lab Results  Component Value Date   TSH 2.755 07/03/2011   BNP (last 3 results) No results for input(s): BNP in the last 8760 hours.  ProBNP (last 3 results) No results for input(s): PROBNP in the last 8760 hours.  Cardiac Panel (last 3 results) No results for input(s): CKTOTAL, CKMB, TROPONINI, RELINDX in the last 72 hours.  Iron/TIBC/Ferritin/ %Sat    Component Value Date/Time   IRON 47 07/04/2011 1157   TIBC 264 07/04/2011 1157   FERRITIN 75 07/04/2011 1157   IRONPCTSAT 18 (L) 07/04/2011 1157     EKG Orders placed or performed in visit on 04/29/16  . EKG     Prior Assessment and Plan Problem List as of 05/08/2016 Reviewed: 04/22/2013  3:42 PM by Rozann Lesches, MD     Cardiovascular and Mediastinum   Postoperative atrial fibrillation [facility])   Last Assessment & Plan 05/20/2012 Office Visit Written 05/20/2012  3:33 PM by Satira Sark, MD    Maintaining sinus rhythm. This has not been recurrent. Was taken off Coumadin at her last visit.      Coronary atherosclerosis of native coronary artery   Last Assessment & Plan 04/22/2013 Office Visit Written 04/22/2013  3:42 PM by Satira Sark, MD    Multivessel disease status post CABG in March 2013. No active angina. Continues on  aspirin,  statin.        Other   Dyslipidemia   Last Assessment & Plan 04/22/2013 Office Visit Written 04/22/2013  3:42 PM by Satira Sark, MD    On Lipitor, followed by Dr. Woody Seller.      S/P CABG x 4   Last Assessment & Plan 08/14/2011 Office Visit Written 08/17/2011 11:00 AM by Ezra Sites, MD    The patient reports no recurrent substernal chest pain.  She initially presented with a non-ST elevation myocardial infarction and was found to have severe multivessel coronary artery disease.  Her sternotomy scar is well-healed.  There is no drainage or evidence of infection or instability of the sternum.      S/P AVR (aortic valve replacement)   Last Assessment & Plan 04/22/2013 Office Visit Written 04/22/2013  3:41 PM by Satira Sark, MD    Stable examination,  Last echocardiogram in 2013. Plan to continue observation for now.      S/P myomectomy   Last Assessment & Plan 11/27/2011 Office Visit Written 11/27/2011  1:35 PM by Ezra Sites, MD    No recurrent shortness of breath. No subvalvular gradient on echocardiogram.          Imaging: No results found.

## 2016-05-08 NOTE — Patient Instructions (Signed)
Your physician recommends that you schedule a follow-up appointment in: 3 Months with Dr. Domenic Polite in [place] recommends that you continue on your current medications as directed. Please refer to the Current Medication list given to you today.  If you need a refill on your cardiac medications before your next appointment, please call your pharmacy.  Thank you for choosing [facility]!

## 2016-05-09 DIAGNOSIS — E1122 Type 2 diabetes mellitus with diabetic chronic kidney disease: Secondary | ICD-10-CM | POA: Diagnosis not present

## 2016-05-09 DIAGNOSIS — Z6839 Body mass index (BMI) 39.0-39.9, adult: Secondary | ICD-10-CM | POA: Diagnosis not present

## 2016-05-09 DIAGNOSIS — Z299 Encounter for prophylactic measures, unspecified: Secondary | ICD-10-CM | POA: Diagnosis not present

## 2016-05-09 DIAGNOSIS — Z713 Dietary counseling and surveillance: Secondary | ICD-10-CM | POA: Diagnosis not present

## 2016-05-09 DIAGNOSIS — N182 Chronic kidney disease, stage 2 (mild): Secondary | ICD-10-CM | POA: Diagnosis not present

## 2016-05-09 DIAGNOSIS — I2510 Atherosclerotic heart disease of native coronary artery without angina pectoris: Secondary | ICD-10-CM | POA: Diagnosis not present

## 2016-08-07 NOTE — Progress Notes (Signed)
Cardiology Office Note  Date: 08/11/2016   ID: Melinda Vang, DOB 1945/05/02, MRN [id number]  PCP: Glenda Chroman, MD  Primary Cardiologist: Rozann Lesches, MD   Chief Complaint  Patient presents with  . History of AVR    History of Present Illness: Melinda Vang is a 71 y.o. female last seen in January by Ms. Lawrence NP following hospitalization at [facility]. She is here today for a follow-up visit. Reports no overall change in terms of chronic dyspnea on exertion. She has had no palpitations or recurring chest pain.  Echocardiogram from January at [facility] is outlined below. LVEF normal and mean gradient 17 mmHg across bioprosthetic AVR.  I reviewed her medications. Current cardiac regimen includes aspirin, Lipitor, Lasix, Lopressor, and potassium supplements.  Past Medical History:  Diagnosis Date  . Anxiety   . Developmental delay   . Essential hypertension, benign   . GERD (gastroesophageal reflux disease)   . Gout   . History of anemia   . Peripheral vascular disease ([place])   . Postoperative atrial fibrillation (HCC)    Initially on amiodarone but discontinued due to severe nausea.  . Rectal cancer ([place])   . S/P AVR (aortic valve replacement) 07/05/2011   19 mm [facility] Ease pericardial tissue valve with septal myomectomy  . 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 Status post myomectomy 07/05/2011  . Type 2 diabetes mellitus ([place])     Past Surgical History:  Procedure Laterality Date  . ABDOMINAL HYSTERECTOMY    . AORTIC VALVE REPLACEMENT  07/04/2011   Procedure: AORTIC VALVE REPLACEMENT (AVR);  Surgeon: Rexene Alberts, MD;  Location: [place];  Service: Open Heart Surgery;  Laterality: N/A;  . COLONOSCOPY N/A 09/14/2013   Procedure: COLONOSCOPY;  Surgeon: Rogene Houston, MD;  [facility];  Service: Endoscopy;  Laterality: N/A;  830  . COLONOSCOPY W/ BIOPSIES AND POLYPECTOMY    . CORONARY ARTERY BYPASS GRAFT   07/04/2011   Procedure: CORONARY ARTERY BYPASS GRAFTING (CABG);  Surgeon: Rexene Alberts, MD;  Location: [place];  Service: Open Heart Surgery;  Laterality: N/A;  four grafts using bilateral leg greater saphenous vein harvested endoscopically.  Marland Kitchen LEFT HEART CATHETERIZATION WITH CORONARY ANGIOGRAM N/A 07/04/2011   Procedure: LEFT HEART CATHETERIZATION WITH CORONARY ANGIOGRAM;  Surgeon: [name], MD;  Location: [facility] CATH LAB;  Service: Cardiovascular;  Laterality: N/A;  . LOW ANTERIOR BOWEL RESECTION  2011   rectal cancer  . MYOMECTOMY  07/04/2011   Procedure: MYOMECTOMY;  Surgeon: Rexene Alberts, MD;  Location: [place];  Service: Open Heart Surgery;  Laterality: N/A;  septal    Current Outpatient Prescriptions  Medication Sig Dispense Refill  . aspirin EC 81 MG tablet Take 81 mg by mouth daily.    Marland Kitchen atorvastatin (LIPITOR) 20 MG tablet Take 1 tablet (20 mg total) by mouth daily at 6 PM. 30 tablet 1  . Empagliflozin (JARDIANCE) 10 MG TABS Take 10 mg by mouth daily.    . furosemide (LASIX) 20 MG tablet Take 20 mg by mouth daily.     Marland Kitchen glipiZIDE (GLUCOTROL XL) 10 MG 24 hr tablet Take 10 mg by mouth 2 (two) times daily.    Marland Kitchen levothyroxine (SYNTHROID, LEVOTHROID) 25 MCG tablet Take 1 tablet by mouth Daily.    . metoprolol tartrate (LOPRESSOR) 25 MG tablet Take 1 tablet (25 mg total) by mouth 2 (two) times  daily. 60 tablet 1  . potassium chloride (K-DUR,KLOR-CON) 10 MEQ tablet Take 10 mEq by mouth daily.    . raloxifene (EVISTA) 60 MG tablet Take 60 mg by mouth daily.    . saxagliptin HCl (ONGLYZA) 5 MG TABS tablet Take 5 mg by mouth daily.    . vitamin C (ASCORBIC ACID) 500 MG tablet Take 500 mg by mouth daily.    . Vitamin D, Ergocalciferol, (DRISDOL) 50000 UNITS CAPS Take 50,000 Units by mouth every 7 (seven) days. mondays     No current facility-administered medications for this visit.    Allergies:  Amiodarone   Social History: The patient  reports that she quit smoking about 27 years ago.  Her smoking use included Cigarettes. She has a 20.00 pack-year smoking history. She has never used smokeless tobacco. She reports that she does not drink alcohol or use drugs.   ROS:  Please see the history of present illness. Otherwise, complete review of systems is positive for chronic shortness of breath.  All other systems are reviewed and negative.   Physical Exam: VS:  BP 126/65   Pulse 70   Ht 4\' 10"  (1.473 m)   Wt 176 lb 3.2 oz (79.9 kg)   SpO2 98%   BMI 36.83 kg/m , BMI Body mass index is 36.83 kg/m.  Wt Readings from Last 3 Encounters:  08/11/16 176 lb 3.2 oz (79.9 kg)  05/08/16 183 lb (83 kg)  12/14/15 182 lb (82.6 kg)    Short statured, overweight woman, appears comfortable at rest. HEENT: Conjunctiva and lids normal, oropharynx clear.  Neck: Supple, no elevated JVP or carotid bruits, no thyromegaly.  Lungs: Clear to auscultation, nonlabored breathing at rest.  Cardiac: Regular rate and rhythm, no S3, 3/6 systolic murmur, no pericardial rub.  Abdomen: Soft, nontender, bowel sounds present.  Extremities: Chronic appearing mild edema/lymphedema, with compression stockings. Skin: Warm and dry. Musculoskeletal: No kyphosis.  Neuropsychiatric: Alert and oriented 3, pleasant.  ECG: I personally reviewed the tracing from 04/23/2016 which showed sinus rhythm with prolonged PR interval, left anterior vesicular block, poor R-wave progression.  Recent Labwork: No results found for requested labs within last 8760 hours.     Component Value Date/Time   CHOL 200 07/04/2011 0600   TRIG 257 (H) 07/04/2011 0600   HDL 48 07/04/2011 0600   CHOLHDL 4.2 07/04/2011 0600   VLDL 51 (H) 07/04/2011 0600   LDLCALC 101 (H) 07/04/2011 0600   Other Studies Reviewed Today:  Echocardiogram 04/24/2016 [facility]): Mild LVH with LVEF 50-55%, no comment on diastolic function, mild left atrial enlargement, mild right atrial enlargement, mildly dilated right ventricle, bioprosthesis in  aortic position with mean gradient 17 mmHg, mild mitral regurgitation, moderate tricuspid regurgitation.  Assessment and Plan:  1. History of bioprosthetic AVR and septal myomectomy in 2013 at the time of CABG. She is symptomatically stable, chronic dyspnea exertion that has not changed. Echocardiogram done at [facility] back in January as outlined above. Mean gradient of AVR 17 mmHg.  2. CAD status post CABG, no active angina symptoms on present medical therapy. Continue aspirin and statin.  3. History of postoperative atrial fibrillation without obvious recurrences. She is not anticoagulated.  4. Essential hypertension, blood pressure is well controlled today. No changes made.  Current medicines were reviewed with the patient today.  Disposition: Follow-up in 6 months.  Signed, Satira Sark, MD, [facility] 08/11/2016 11:38 AM    [facility] [place], [place], [place]  [place] Phone: [phone]; Fax: [phone]

## 2016-08-11 ENCOUNTER — Ambulatory Visit (INDEPENDENT_AMBULATORY_CARE_PROVIDER_SITE_OTHER): Payer: Medicare Other | Admitting: Cardiology

## 2016-08-11 ENCOUNTER — Encounter: Payer: Self-pay | Admitting: Cardiology

## 2016-08-11 VITALS — BP 126/65 | HR 70 | Ht <= 58 in | Wt 176.2 lb

## 2016-08-11 DIAGNOSIS — I9789 Other postprocedural complications and disorders of the circulatory system, not elsewhere classified: Secondary | ICD-10-CM | POA: Diagnosis not present

## 2016-08-11 DIAGNOSIS — I10 Essential (primary) hypertension: Secondary | ICD-10-CM

## 2016-08-11 DIAGNOSIS — I4891 Unspecified atrial fibrillation: Secondary | ICD-10-CM | POA: Diagnosis not present

## 2016-08-11 DIAGNOSIS — I2510 Atherosclerotic heart disease of native coronary artery without angina pectoris: Secondary | ICD-10-CM

## 2016-08-11 DIAGNOSIS — Z952 Presence of prosthetic heart valve: Secondary | ICD-10-CM

## 2016-08-11 NOTE — Patient Instructions (Signed)

## 2016-08-12 DIAGNOSIS — Z79899 Other long term (current) drug therapy: Secondary | ICD-10-CM | POA: Diagnosis not present

## 2016-08-12 DIAGNOSIS — Z7189 Other specified counseling: Secondary | ICD-10-CM | POA: Diagnosis not present

## 2016-08-12 DIAGNOSIS — Z1389 Encounter for screening for other disorder: Secondary | ICD-10-CM | POA: Diagnosis not present

## 2016-08-12 DIAGNOSIS — R5383 Other fatigue: Secondary | ICD-10-CM | POA: Diagnosis not present

## 2016-08-12 DIAGNOSIS — E7800 Pure hypercholesterolemia, unspecified: Secondary | ICD-10-CM | POA: Diagnosis not present

## 2016-08-12 DIAGNOSIS — E1122 Type 2 diabetes mellitus with diabetic chronic kidney disease: Secondary | ICD-10-CM | POA: Diagnosis not present

## 2016-08-12 DIAGNOSIS — E039 Hypothyroidism, unspecified: Secondary | ICD-10-CM | POA: Diagnosis not present

## 2016-08-12 DIAGNOSIS — Z0000 Encounter for general adult medical examination without abnormal findings: Secondary | ICD-10-CM | POA: Diagnosis not present

## 2016-08-12 DIAGNOSIS — I4891 Unspecified atrial fibrillation: Secondary | ICD-10-CM | POA: Diagnosis not present

## 2016-08-12 DIAGNOSIS — Z299 Encounter for prophylactic measures, unspecified: Secondary | ICD-10-CM | POA: Diagnosis not present

## 2016-08-12 DIAGNOSIS — I10 Essential (primary) hypertension: Secondary | ICD-10-CM | POA: Diagnosis not present

## 2016-08-12 DIAGNOSIS — Z6837 Body mass index (BMI) 37.0-37.9, adult: Secondary | ICD-10-CM | POA: Diagnosis not present

## 2016-08-12 DIAGNOSIS — E1165 Type 2 diabetes mellitus with hyperglycemia: Secondary | ICD-10-CM | POA: Diagnosis not present

## 2016-09-05 DIAGNOSIS — Z79899 Other long term (current) drug therapy: Secondary | ICD-10-CM | POA: Diagnosis not present

## 2016-09-11 ENCOUNTER — Encounter (INDEPENDENT_AMBULATORY_CARE_PROVIDER_SITE_OTHER): Payer: Self-pay | Admitting: *Deleted

## 2016-10-24 DIAGNOSIS — I2510 Atherosclerotic heart disease of native coronary artery without angina pectoris: Secondary | ICD-10-CM | POA: Diagnosis not present

## 2016-10-24 DIAGNOSIS — Z299 Encounter for prophylactic measures, unspecified: Secondary | ICD-10-CM | POA: Diagnosis not present

## 2016-10-24 DIAGNOSIS — M7989 Other specified soft tissue disorders: Secondary | ICD-10-CM | POA: Diagnosis not present

## 2016-10-24 DIAGNOSIS — Z6839 Body mass index (BMI) 39.0-39.9, adult: Secondary | ICD-10-CM | POA: Diagnosis not present

## 2016-10-24 DIAGNOSIS — I10 Essential (primary) hypertension: Secondary | ICD-10-CM | POA: Diagnosis not present

## 2016-10-24 DIAGNOSIS — I4891 Unspecified atrial fibrillation: Secondary | ICD-10-CM | POA: Diagnosis not present

## 2016-10-28 DIAGNOSIS — M7989 Other specified soft tissue disorders: Secondary | ICD-10-CM | POA: Diagnosis not present

## 2016-10-29 DIAGNOSIS — I10 Essential (primary) hypertension: Secondary | ICD-10-CM | POA: Diagnosis not present

## 2016-10-29 DIAGNOSIS — M7989 Other specified soft tissue disorders: Secondary | ICD-10-CM | POA: Diagnosis not present

## 2016-10-29 DIAGNOSIS — Z6839 Body mass index (BMI) 39.0-39.9, adult: Secondary | ICD-10-CM | POA: Diagnosis not present

## 2016-10-29 DIAGNOSIS — I4891 Unspecified atrial fibrillation: Secondary | ICD-10-CM | POA: Diagnosis not present

## 2016-10-29 DIAGNOSIS — E1165 Type 2 diabetes mellitus with hyperglycemia: Secondary | ICD-10-CM | POA: Diagnosis not present

## 2016-10-29 DIAGNOSIS — E039 Hypothyroidism, unspecified: Secondary | ICD-10-CM | POA: Diagnosis not present

## 2016-10-29 DIAGNOSIS — L97909 Non-pressure chronic ulcer of unspecified part of unspecified lower leg with unspecified severity: Secondary | ICD-10-CM | POA: Diagnosis not present

## 2016-10-29 DIAGNOSIS — Z299 Encounter for prophylactic measures, unspecified: Secondary | ICD-10-CM | POA: Diagnosis not present

## 2016-10-29 DIAGNOSIS — I2510 Atherosclerotic heart disease of native coronary artery without angina pectoris: Secondary | ICD-10-CM | POA: Diagnosis not present

## 2016-10-29 DIAGNOSIS — E7800 Pure hypercholesterolemia, unspecified: Secondary | ICD-10-CM | POA: Diagnosis not present

## 2016-10-29 DIAGNOSIS — E860 Dehydration: Secondary | ICD-10-CM | POA: Diagnosis not present

## 2016-10-29 DIAGNOSIS — I83009 Varicose veins of unspecified lower extremity with ulcer of unspecified site: Secondary | ICD-10-CM | POA: Diagnosis not present

## 2016-11-10 DIAGNOSIS — Z79899 Other long term (current) drug therapy: Secondary | ICD-10-CM | POA: Diagnosis not present

## 2016-11-12 DIAGNOSIS — R600 Localized edema: Secondary | ICD-10-CM | POA: Diagnosis not present

## 2016-11-12 DIAGNOSIS — F79 Unspecified intellectual disabilities: Secondary | ICD-10-CM | POA: Diagnosis present

## 2016-11-12 DIAGNOSIS — L97909 Non-pressure chronic ulcer of unspecified part of unspecified lower leg with unspecified severity: Secondary | ICD-10-CM | POA: Diagnosis not present

## 2016-11-12 DIAGNOSIS — Z7984 Long term (current) use of oral hypoglycemic drugs: Secondary | ICD-10-CM | POA: Diagnosis not present

## 2016-11-12 DIAGNOSIS — N179 Acute kidney failure, unspecified: Secondary | ICD-10-CM | POA: Diagnosis not present

## 2016-11-12 DIAGNOSIS — L97819 Non-pressure chronic ulcer of other part of right lower leg with unspecified severity: Secondary | ICD-10-CM | POA: Diagnosis present

## 2016-11-12 DIAGNOSIS — Z6841 Body Mass Index (BMI) 40.0 and over, adult: Secondary | ICD-10-CM | POA: Diagnosis not present

## 2016-11-12 DIAGNOSIS — E7800 Pure hypercholesterolemia, unspecified: Secondary | ICD-10-CM | POA: Diagnosis present

## 2016-11-12 DIAGNOSIS — I129 Hypertensive chronic kidney disease with stage 1 through stage 4 chronic kidney disease, or unspecified chronic kidney disease: Secondary | ICD-10-CM | POA: Diagnosis present

## 2016-11-12 DIAGNOSIS — I872 Venous insufficiency (chronic) (peripheral): Secondary | ICD-10-CM | POA: Diagnosis not present

## 2016-11-12 DIAGNOSIS — Z79899 Other long term (current) drug therapy: Secondary | ICD-10-CM | POA: Diagnosis not present

## 2016-11-12 DIAGNOSIS — I739 Peripheral vascular disease, unspecified: Secondary | ICD-10-CM | POA: Diagnosis not present

## 2016-11-12 DIAGNOSIS — E039 Hypothyroidism, unspecified: Secondary | ICD-10-CM | POA: Diagnosis present

## 2016-11-12 DIAGNOSIS — L03115 Cellulitis of right lower limb: Secondary | ICD-10-CM | POA: Diagnosis not present

## 2016-11-12 DIAGNOSIS — N189 Chronic kidney disease, unspecified: Secondary | ICD-10-CM | POA: Diagnosis present

## 2016-11-12 DIAGNOSIS — E669 Obesity, unspecified: Secondary | ICD-10-CM | POA: Diagnosis present

## 2016-11-12 DIAGNOSIS — Z7982 Long term (current) use of aspirin: Secondary | ICD-10-CM | POA: Diagnosis not present

## 2016-11-12 DIAGNOSIS — I4891 Unspecified atrial fibrillation: Secondary | ICD-10-CM | POA: Diagnosis present

## 2016-11-12 DIAGNOSIS — M1990 Unspecified osteoarthritis, unspecified site: Secondary | ICD-10-CM | POA: Diagnosis present

## 2016-11-12 DIAGNOSIS — M810 Age-related osteoporosis without current pathological fracture: Secondary | ICD-10-CM | POA: Diagnosis present

## 2016-11-12 DIAGNOSIS — E1151 Type 2 diabetes mellitus with diabetic peripheral angiopathy without gangrene: Secondary | ICD-10-CM | POA: Diagnosis present

## 2016-11-12 DIAGNOSIS — E1122 Type 2 diabetes mellitus with diabetic chronic kidney disease: Secondary | ICD-10-CM | POA: Diagnosis present

## 2016-11-12 DIAGNOSIS — L03116 Cellulitis of left lower limb: Secondary | ICD-10-CM | POA: Diagnosis present

## 2016-11-12 DIAGNOSIS — E1165 Type 2 diabetes mellitus with hyperglycemia: Secondary | ICD-10-CM | POA: Diagnosis present

## 2016-11-12 DIAGNOSIS — L97829 Non-pressure chronic ulcer of other part of left lower leg with unspecified severity: Secondary | ICD-10-CM | POA: Diagnosis not present

## 2016-11-12 DIAGNOSIS — Z85048 Personal history of other malignant neoplasm of rectum, rectosigmoid junction, and anus: Secondary | ICD-10-CM | POA: Diagnosis not present

## 2016-11-12 DIAGNOSIS — I83028 Varicose veins of left lower extremity with ulcer other part of lower leg: Secondary | ICD-10-CM | POA: Diagnosis present

## 2016-11-12 DIAGNOSIS — I83018 Varicose veins of right lower extremity with ulcer other part of lower leg: Secondary | ICD-10-CM | POA: Diagnosis present

## 2016-11-19 DIAGNOSIS — L03115 Cellulitis of right lower limb: Secondary | ICD-10-CM | POA: Diagnosis not present

## 2016-11-19 DIAGNOSIS — E1151 Type 2 diabetes mellitus with diabetic peripheral angiopathy without gangrene: Secondary | ICD-10-CM | POA: Diagnosis not present

## 2016-11-19 DIAGNOSIS — L97212 Non-pressure chronic ulcer of right calf with fat layer exposed: Secondary | ICD-10-CM | POA: Diagnosis not present

## 2016-11-19 DIAGNOSIS — Z4800 Encounter for change or removal of nonsurgical wound dressing: Secondary | ICD-10-CM | POA: Diagnosis not present

## 2016-11-19 DIAGNOSIS — I10 Essential (primary) hypertension: Secondary | ICD-10-CM | POA: Diagnosis not present

## 2016-11-19 DIAGNOSIS — I4891 Unspecified atrial fibrillation: Secondary | ICD-10-CM | POA: Diagnosis not present

## 2016-11-19 DIAGNOSIS — L03116 Cellulitis of left lower limb: Secondary | ICD-10-CM | POA: Diagnosis not present

## 2016-11-19 DIAGNOSIS — I2510 Atherosclerotic heart disease of native coronary artery without angina pectoris: Secondary | ICD-10-CM | POA: Diagnosis not present

## 2016-11-19 DIAGNOSIS — I83012 Varicose veins of right lower extremity with ulcer of calf: Secondary | ICD-10-CM | POA: Diagnosis not present

## 2016-11-19 DIAGNOSIS — Z7984 Long term (current) use of oral hypoglycemic drugs: Secondary | ICD-10-CM | POA: Diagnosis not present

## 2016-11-19 DIAGNOSIS — F79 Unspecified intellectual disabilities: Secondary | ICD-10-CM | POA: Diagnosis not present

## 2016-11-19 DIAGNOSIS — I83022 Varicose veins of left lower extremity with ulcer of calf: Secondary | ICD-10-CM | POA: Diagnosis not present

## 2016-11-19 DIAGNOSIS — Z9181 History of falling: Secondary | ICD-10-CM | POA: Diagnosis not present

## 2016-11-19 DIAGNOSIS — L97222 Non-pressure chronic ulcer of left calf with fat layer exposed: Secondary | ICD-10-CM | POA: Diagnosis not present

## 2016-11-21 DIAGNOSIS — L03115 Cellulitis of right lower limb: Secondary | ICD-10-CM | POA: Diagnosis not present

## 2016-11-21 DIAGNOSIS — I83022 Varicose veins of left lower extremity with ulcer of calf: Secondary | ICD-10-CM | POA: Diagnosis not present

## 2016-11-21 DIAGNOSIS — L97222 Non-pressure chronic ulcer of left calf with fat layer exposed: Secondary | ICD-10-CM | POA: Diagnosis not present

## 2016-11-21 DIAGNOSIS — L97212 Non-pressure chronic ulcer of right calf with fat layer exposed: Secondary | ICD-10-CM | POA: Diagnosis not present

## 2016-11-21 DIAGNOSIS — L03116 Cellulitis of left lower limb: Secondary | ICD-10-CM | POA: Diagnosis not present

## 2016-11-21 DIAGNOSIS — I83012 Varicose veins of right lower extremity with ulcer of calf: Secondary | ICD-10-CM | POA: Diagnosis not present

## 2016-11-24 DIAGNOSIS — L97909 Non-pressure chronic ulcer of unspecified part of unspecified lower leg with unspecified severity: Secondary | ICD-10-CM | POA: Diagnosis not present

## 2016-11-24 DIAGNOSIS — E7800 Pure hypercholesterolemia, unspecified: Secondary | ICD-10-CM | POA: Diagnosis not present

## 2016-11-24 DIAGNOSIS — Z299 Encounter for prophylactic measures, unspecified: Secondary | ICD-10-CM | POA: Diagnosis not present

## 2016-11-24 DIAGNOSIS — J209 Acute bronchitis, unspecified: Secondary | ICD-10-CM | POA: Diagnosis not present

## 2016-11-24 DIAGNOSIS — I4891 Unspecified atrial fibrillation: Secondary | ICD-10-CM | POA: Diagnosis not present

## 2016-11-24 DIAGNOSIS — I10 Essential (primary) hypertension: Secondary | ICD-10-CM | POA: Diagnosis not present

## 2016-11-24 DIAGNOSIS — Z09 Encounter for follow-up examination after completed treatment for conditions other than malignant neoplasm: Secondary | ICD-10-CM | POA: Diagnosis not present

## 2016-11-24 DIAGNOSIS — E1122 Type 2 diabetes mellitus with diabetic chronic kidney disease: Secondary | ICD-10-CM | POA: Diagnosis not present

## 2016-11-24 DIAGNOSIS — I2510 Atherosclerotic heart disease of native coronary artery without angina pectoris: Secondary | ICD-10-CM | POA: Diagnosis not present

## 2016-11-24 DIAGNOSIS — L98499 Non-pressure chronic ulcer of skin of other sites with unspecified severity: Secondary | ICD-10-CM | POA: Diagnosis not present

## 2016-11-24 DIAGNOSIS — Z6838 Body mass index (BMI) 38.0-38.9, adult: Secondary | ICD-10-CM | POA: Diagnosis not present

## 2016-11-24 DIAGNOSIS — I83009 Varicose veins of unspecified lower extremity with ulcer of unspecified site: Secondary | ICD-10-CM | POA: Diagnosis not present

## 2016-11-25 DIAGNOSIS — L97212 Non-pressure chronic ulcer of right calf with fat layer exposed: Secondary | ICD-10-CM | POA: Diagnosis not present

## 2016-11-25 DIAGNOSIS — L97222 Non-pressure chronic ulcer of left calf with fat layer exposed: Secondary | ICD-10-CM | POA: Diagnosis not present

## 2016-11-25 DIAGNOSIS — L03115 Cellulitis of right lower limb: Secondary | ICD-10-CM | POA: Diagnosis not present

## 2016-11-25 DIAGNOSIS — I83012 Varicose veins of right lower extremity with ulcer of calf: Secondary | ICD-10-CM | POA: Diagnosis not present

## 2016-11-25 DIAGNOSIS — L03116 Cellulitis of left lower limb: Secondary | ICD-10-CM | POA: Diagnosis not present

## 2016-11-25 DIAGNOSIS — I83022 Varicose veins of left lower extremity with ulcer of calf: Secondary | ICD-10-CM | POA: Diagnosis not present

## 2016-11-26 DIAGNOSIS — L97222 Non-pressure chronic ulcer of left calf with fat layer exposed: Secondary | ICD-10-CM | POA: Diagnosis not present

## 2016-11-26 DIAGNOSIS — L97909 Non-pressure chronic ulcer of unspecified part of unspecified lower leg with unspecified severity: Secondary | ICD-10-CM | POA: Diagnosis not present

## 2016-11-26 DIAGNOSIS — I83012 Varicose veins of right lower extremity with ulcer of calf: Secondary | ICD-10-CM | POA: Diagnosis not present

## 2016-11-26 DIAGNOSIS — L03116 Cellulitis of left lower limb: Secondary | ICD-10-CM | POA: Diagnosis not present

## 2016-11-26 DIAGNOSIS — I83022 Varicose veins of left lower extremity with ulcer of calf: Secondary | ICD-10-CM | POA: Diagnosis not present

## 2016-11-26 DIAGNOSIS — L97212 Non-pressure chronic ulcer of right calf with fat layer exposed: Secondary | ICD-10-CM | POA: Diagnosis not present

## 2016-11-26 DIAGNOSIS — I872 Venous insufficiency (chronic) (peripheral): Secondary | ICD-10-CM | POA: Diagnosis not present

## 2016-11-26 DIAGNOSIS — L03115 Cellulitis of right lower limb: Secondary | ICD-10-CM | POA: Diagnosis not present

## 2016-11-28 DIAGNOSIS — I83022 Varicose veins of left lower extremity with ulcer of calf: Secondary | ICD-10-CM | POA: Diagnosis not present

## 2016-11-28 DIAGNOSIS — I83012 Varicose veins of right lower extremity with ulcer of calf: Secondary | ICD-10-CM | POA: Diagnosis not present

## 2016-11-28 DIAGNOSIS — L03115 Cellulitis of right lower limb: Secondary | ICD-10-CM | POA: Diagnosis not present

## 2016-11-28 DIAGNOSIS — L03116 Cellulitis of left lower limb: Secondary | ICD-10-CM | POA: Diagnosis not present

## 2016-11-28 DIAGNOSIS — L97212 Non-pressure chronic ulcer of right calf with fat layer exposed: Secondary | ICD-10-CM | POA: Diagnosis not present

## 2016-11-28 DIAGNOSIS — L97222 Non-pressure chronic ulcer of left calf with fat layer exposed: Secondary | ICD-10-CM | POA: Diagnosis not present

## 2016-12-02 DIAGNOSIS — L03115 Cellulitis of right lower limb: Secondary | ICD-10-CM | POA: Diagnosis not present

## 2016-12-02 DIAGNOSIS — I83022 Varicose veins of left lower extremity with ulcer of calf: Secondary | ICD-10-CM | POA: Diagnosis not present

## 2016-12-02 DIAGNOSIS — L97212 Non-pressure chronic ulcer of right calf with fat layer exposed: Secondary | ICD-10-CM | POA: Diagnosis not present

## 2016-12-02 DIAGNOSIS — L03116 Cellulitis of left lower limb: Secondary | ICD-10-CM | POA: Diagnosis not present

## 2016-12-02 DIAGNOSIS — I83012 Varicose veins of right lower extremity with ulcer of calf: Secondary | ICD-10-CM | POA: Diagnosis not present

## 2016-12-02 DIAGNOSIS — L97222 Non-pressure chronic ulcer of left calf with fat layer exposed: Secondary | ICD-10-CM | POA: Diagnosis not present

## 2016-12-04 DIAGNOSIS — I4891 Unspecified atrial fibrillation: Secondary | ICD-10-CM | POA: Diagnosis not present

## 2016-12-04 DIAGNOSIS — I509 Heart failure, unspecified: Secondary | ICD-10-CM | POA: Diagnosis not present

## 2016-12-04 DIAGNOSIS — Z7984 Long term (current) use of oral hypoglycemic drugs: Secondary | ICD-10-CM | POA: Diagnosis not present

## 2016-12-04 DIAGNOSIS — I872 Venous insufficiency (chronic) (peripheral): Secondary | ICD-10-CM | POA: Diagnosis not present

## 2016-12-04 DIAGNOSIS — Z85048 Personal history of other malignant neoplasm of rectum, rectosigmoid junction, and anus: Secondary | ICD-10-CM | POA: Diagnosis not present

## 2016-12-04 DIAGNOSIS — L97818 Non-pressure chronic ulcer of other part of right lower leg with other specified severity: Secondary | ICD-10-CM | POA: Diagnosis not present

## 2016-12-04 DIAGNOSIS — L97828 Non-pressure chronic ulcer of other part of left lower leg with other specified severity: Secondary | ICD-10-CM | POA: Diagnosis not present

## 2016-12-04 DIAGNOSIS — E119 Type 2 diabetes mellitus without complications: Secondary | ICD-10-CM | POA: Diagnosis not present

## 2016-12-04 DIAGNOSIS — I83028 Varicose veins of left lower extremity with ulcer other part of lower leg: Secondary | ICD-10-CM | POA: Diagnosis not present

## 2016-12-04 DIAGNOSIS — L97811 Non-pressure chronic ulcer of other part of right lower leg limited to breakdown of skin: Secondary | ICD-10-CM | POA: Diagnosis not present

## 2016-12-04 DIAGNOSIS — E1151 Type 2 diabetes mellitus with diabetic peripheral angiopathy without gangrene: Secondary | ICD-10-CM | POA: Diagnosis not present

## 2016-12-04 DIAGNOSIS — I110 Hypertensive heart disease with heart failure: Secondary | ICD-10-CM | POA: Diagnosis not present

## 2016-12-04 DIAGNOSIS — L97821 Non-pressure chronic ulcer of other part of left lower leg limited to breakdown of skin: Secondary | ICD-10-CM | POA: Diagnosis not present

## 2016-12-04 DIAGNOSIS — I252 Old myocardial infarction: Secondary | ICD-10-CM | POA: Diagnosis not present

## 2016-12-04 DIAGNOSIS — Z79899 Other long term (current) drug therapy: Secondary | ICD-10-CM | POA: Diagnosis not present

## 2016-12-04 DIAGNOSIS — I83018 Varicose veins of right lower extremity with ulcer other part of lower leg: Secondary | ICD-10-CM | POA: Diagnosis not present

## 2016-12-04 DIAGNOSIS — E039 Hypothyroidism, unspecified: Secondary | ICD-10-CM | POA: Diagnosis not present

## 2016-12-05 DIAGNOSIS — L97222 Non-pressure chronic ulcer of left calf with fat layer exposed: Secondary | ICD-10-CM | POA: Diagnosis not present

## 2016-12-05 DIAGNOSIS — L03115 Cellulitis of right lower limb: Secondary | ICD-10-CM | POA: Diagnosis not present

## 2016-12-05 DIAGNOSIS — L03116 Cellulitis of left lower limb: Secondary | ICD-10-CM | POA: Diagnosis not present

## 2016-12-05 DIAGNOSIS — I83012 Varicose veins of right lower extremity with ulcer of calf: Secondary | ICD-10-CM | POA: Diagnosis not present

## 2016-12-05 DIAGNOSIS — I83022 Varicose veins of left lower extremity with ulcer of calf: Secondary | ICD-10-CM | POA: Diagnosis not present

## 2016-12-05 DIAGNOSIS — L97212 Non-pressure chronic ulcer of right calf with fat layer exposed: Secondary | ICD-10-CM | POA: Diagnosis not present

## 2016-12-08 DIAGNOSIS — L03116 Cellulitis of left lower limb: Secondary | ICD-10-CM | POA: Diagnosis not present

## 2016-12-08 DIAGNOSIS — L03115 Cellulitis of right lower limb: Secondary | ICD-10-CM | POA: Diagnosis not present

## 2016-12-08 DIAGNOSIS — I83012 Varicose veins of right lower extremity with ulcer of calf: Secondary | ICD-10-CM | POA: Diagnosis not present

## 2016-12-08 DIAGNOSIS — I83022 Varicose veins of left lower extremity with ulcer of calf: Secondary | ICD-10-CM | POA: Diagnosis not present

## 2016-12-08 DIAGNOSIS — L97212 Non-pressure chronic ulcer of right calf with fat layer exposed: Secondary | ICD-10-CM | POA: Diagnosis not present

## 2016-12-08 DIAGNOSIS — L97222 Non-pressure chronic ulcer of left calf with fat layer exposed: Secondary | ICD-10-CM | POA: Diagnosis not present

## 2016-12-11 DIAGNOSIS — L97811 Non-pressure chronic ulcer of other part of right lower leg limited to breakdown of skin: Secondary | ICD-10-CM | POA: Diagnosis not present

## 2016-12-11 DIAGNOSIS — I110 Hypertensive heart disease with heart failure: Secondary | ICD-10-CM | POA: Diagnosis not present

## 2016-12-11 DIAGNOSIS — I872 Venous insufficiency (chronic) (peripheral): Secondary | ICD-10-CM | POA: Diagnosis not present

## 2016-12-11 DIAGNOSIS — I83018 Varicose veins of right lower extremity with ulcer other part of lower leg: Secondary | ICD-10-CM | POA: Diagnosis not present

## 2016-12-11 DIAGNOSIS — I83028 Varicose veins of left lower extremity with ulcer other part of lower leg: Secondary | ICD-10-CM | POA: Diagnosis not present

## 2016-12-11 DIAGNOSIS — E119 Type 2 diabetes mellitus without complications: Secondary | ICD-10-CM | POA: Diagnosis not present

## 2016-12-11 DIAGNOSIS — L97818 Non-pressure chronic ulcer of other part of right lower leg with other specified severity: Secondary | ICD-10-CM | POA: Diagnosis not present

## 2016-12-11 DIAGNOSIS — L97828 Non-pressure chronic ulcer of other part of left lower leg with other specified severity: Secondary | ICD-10-CM | POA: Diagnosis not present

## 2016-12-11 DIAGNOSIS — L97821 Non-pressure chronic ulcer of other part of left lower leg limited to breakdown of skin: Secondary | ICD-10-CM | POA: Diagnosis not present

## 2016-12-16 DIAGNOSIS — L03116 Cellulitis of left lower limb: Secondary | ICD-10-CM | POA: Diagnosis not present

## 2016-12-16 DIAGNOSIS — I83012 Varicose veins of right lower extremity with ulcer of calf: Secondary | ICD-10-CM | POA: Diagnosis not present

## 2016-12-16 DIAGNOSIS — I83022 Varicose veins of left lower extremity with ulcer of calf: Secondary | ICD-10-CM | POA: Diagnosis not present

## 2016-12-16 DIAGNOSIS — L03115 Cellulitis of right lower limb: Secondary | ICD-10-CM | POA: Diagnosis not present

## 2016-12-16 DIAGNOSIS — L97212 Non-pressure chronic ulcer of right calf with fat layer exposed: Secondary | ICD-10-CM | POA: Diagnosis not present

## 2016-12-16 DIAGNOSIS — L97222 Non-pressure chronic ulcer of left calf with fat layer exposed: Secondary | ICD-10-CM | POA: Diagnosis not present

## 2016-12-18 DIAGNOSIS — Z87898 Personal history of other specified conditions: Secondary | ICD-10-CM | POA: Diagnosis not present

## 2016-12-18 DIAGNOSIS — L97811 Non-pressure chronic ulcer of other part of right lower leg limited to breakdown of skin: Secondary | ICD-10-CM | POA: Diagnosis not present

## 2016-12-18 DIAGNOSIS — L97821 Non-pressure chronic ulcer of other part of left lower leg limited to breakdown of skin: Secondary | ICD-10-CM | POA: Diagnosis not present

## 2016-12-18 DIAGNOSIS — E119 Type 2 diabetes mellitus without complications: Secondary | ICD-10-CM | POA: Diagnosis not present

## 2016-12-18 DIAGNOSIS — Z09 Encounter for follow-up examination after completed treatment for conditions other than malignant neoplasm: Secondary | ICD-10-CM | POA: Diagnosis not present

## 2016-12-18 DIAGNOSIS — I83018 Varicose veins of right lower extremity with ulcer other part of lower leg: Secondary | ICD-10-CM | POA: Diagnosis not present

## 2017-02-26 NOTE — Progress Notes (Signed)
Cardiology Office Note  Date: 02/27/2017   ID: Melinda Vang, DOB [birth year], MRN [id number]  PCP: Glenda Chroman, MD  Primary Cardiologist: Rozann Lesches, MD   Chief Complaint  Patient presents with  . Aortic valve disease    History of Present Illness: Melinda Vang is a 71 y.o. female last seen in April.  She presents today with family member for a follow-up visit.  Chronic shortness of breath has been stable with typical activities.  She does not report any chest pain or palpitations, no recent falls.  She states that she was hospitalized due to leg cellulitis in the interim, was subsequently seen in the wound clinic and is doing much better.  She uses compression hose now.  We went over her medications.  Current cardiac regimen includes aspirin, Lipitor, Lopressor, Lasix, and potassium supplements.  Echocardiogram done at [facility] in January revealed LVEF 50-55%, bioprosthetic aortic valve with mean gradient 17 mmHg.  Past Medical History:  Diagnosis Date  . Anxiety   . Developmental delay   . Essential hypertension, benign   . GERD (gastroesophageal reflux disease)   . Gout   . History of anemia   . Peripheral vascular disease ([place])   . Postoperative atrial fibrillation (HCC)    Initially on amiodarone but discontinued due to severe nausea.  . Rectal cancer ([place])   . S/P AVR (aortic valve replacement) 07/05/2011   19 mm [facility] Ease pericardial tissue valve with septal myomectomy  . 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 Status post myomectomy 07/05/2011  . Type 2 diabetes mellitus ([place])     Past Surgical History:  Procedure Laterality Date  . ABDOMINAL HYSTERECTOMY    . AORTIC VALVE REPLACEMENT (AVR) N/A 07/04/2011   Performed by Rexene Alberts, MD at [facility] OR  . COLONOSCOPY N/A 09/14/2013   Performed by Rogene Houston, MD at [place]  . COLONOSCOPY W/ BIOPSIES AND POLYPECTOMY    . CORONARY ARTERY BYPASS  GRAFTING (CABG) N/A 07/04/2011   Performed by Rexene Alberts, MD at [place]  . LEFT HEART CATHETERIZATION WITH CORONARY ANGIOGRAM N/A 07/04/2011   Performed by [name], MD at [facility] CATH LAB  . LOW ANTERIOR BOWEL RESECTION  2011   rectal cancer  . MYOMECTOMY N/A 07/04/2011   Performed by Rexene Alberts, MD at [facility] OR    Current Outpatient Medications  Medication Sig Dispense Refill  . aspirin EC 81 MG tablet Take 81 mg by mouth daily.    . Empagliflozin (JARDIANCE) 10 MG TABS Take 10 mg by mouth daily.    . furosemide (LASIX) 20 MG tablet Take 20 mg by mouth daily.     Marland Kitchen glipiZIDE (GLUCOTROL XL) 10 MG 24 hr tablet Take 10 mg by mouth 2 (two) times daily.    Marland Kitchen levothyroxine (SYNTHROID, LEVOTHROID) 25 MCG tablet Take 1 tablet by mouth Daily.    . metoprolol tartrate (LOPRESSOR) 25 MG tablet Take 1 tablet (25 mg total) by mouth 2 (two) times daily. 60 tablet 1  . potassium chloride (K-DUR,KLOR-CON) 10 MEQ tablet Take 10 mEq by mouth daily.    . raloxifene (EVISTA) 60 MG tablet Take 60 mg by mouth daily.    . saxagliptin HCl (ONGLYZA) 5 MG TABS tablet Take 5 mg by mouth daily.    . vitamin C (ASCORBIC ACID) 500 MG tablet Take 500 mg by mouth  daily.    . Vitamin D, Ergocalciferol, (DRISDOL) 50000 UNITS CAPS Take 50,000 Units by mouth every 7 (seven) days. mondays    . atorvastatin (LIPITOR) 20 MG tablet Take 1 tablet (20 mg total) by mouth daily at 6 PM. (Patient not taking: Reported on 02/27/2017) 30 tablet 1   No current facility-administered medications for this visit.    Allergies:  Amiodarone   Social History: The patient  reports that she quit smoking about 27 years ago. Her smoking use included cigarettes. She has a 20.00 pack-year smoking history. she has never used smokeless tobacco. She reports that she does not drink alcohol or use drugs.   ROS:  Please see the history of present illness. Otherwise, complete review of systems is positive for intermittent leg swelling.  All other  systems are reviewed and negative.   Physical Exam: VS:  BP (!) 160/78   Pulse 75   Ht 4\' 10"  (1.473 m)   Wt 182 lb 12.8 oz (82.9 kg)   SpO2 97%   BMI 38.21 kg/m , BMI Body mass index is 38.21 kg/m.  Wt Readings from Last 3 Encounters:  02/27/17 182 lb 12.8 oz (82.9 kg)  08/11/16 176 lb 3.2 oz (79.9 kg)  05/08/16 183 lb (83 kg)    General: Short statured obese woman, appears comfortable at rest. HEENT: Conjunctiva and lids normal, oropharynx clear. Neck: Supple, no elevated JVP or carotid bruits, no thyromegaly. Lungs: Clear to auscultation, nonlabored breathing at rest. Cardiac: Regular rate and rhythm, no S3, 3/6 systolic murmur, no pericardial rub. Abdomen: Soft, nontender, no hepatomegaly, bowel sounds present, no guarding or rebound. Extremities: Compression hose in place bilaterally below the knees. Skin: Warm and dry. Musculoskeletal: No kyphosis. Neuropsychiatric: Alert and oriented x3, affect grossly appropriate.  ECG: I personally reviewed the tracing from 04/23/2016 which showed sinus rhythm with left anterior fascicular block and decreased R wave progression.  Recent Labwork:  January 2018: BUN 45, creatinine 1.53, potassium 4.7, peak troponin T 0.18, hemoglobin 11.4, platelets 213  Other Studies Reviewed Today:  Echocardiogram 04/24/2016 [facility]): Mild LVH with LVEF 50-55%, no comment on diastolic function, mild biatrial enlargement, mild right ventricular enlargement, bioprosthetic aortic valve with reportedly moderate stenosis, mild mitral regurgitation, moderate tricuspid regurgitation.  Mean AV gradient 17 mmHg with peak gradient 29 mmHg.  Assessment and Plan:  1.  CAD status post CABG in 2013.  She reports no angina symptoms and continues on aspirin and statin therapy.  2.  Bioprosthetic AVR and septal myomectomy in 2013.  Plan to follow-up echocardiogram for next visit in 6 months due to increased transaortic gradient.  3.  History of  postoperative atrial fibrillation.  She has had no recurrences.  Continue with observation.  4.  Essential hypertension, continue with current regimen and keep follow-up with Dr. Woody Seller.  Current medicines were reviewed with the patient today.   Orders Placed This Encounter  Procedures  . ECHOCARDIOGRAM COMPLETE    Disposition: Follow-up in 6 months.  Signed, Satira Sark, MD, [facility] 02/27/2017 2:05 PM    [facility] [place] at [place], [place],  [address] Phone: [phone]; Fax: [phone]

## 2017-02-27 ENCOUNTER — Ambulatory Visit (INDEPENDENT_AMBULATORY_CARE_PROVIDER_SITE_OTHER): Payer: Medicare Other | Admitting: Cardiology

## 2017-02-27 ENCOUNTER — Encounter: Payer: Self-pay | Admitting: *Deleted

## 2017-02-27 ENCOUNTER — Encounter: Payer: Self-pay | Admitting: Cardiology

## 2017-02-27 VITALS — BP 160/78 | HR 75 | Ht <= 58 in | Wt 182.8 lb

## 2017-02-27 DIAGNOSIS — I10 Essential (primary) hypertension: Secondary | ICD-10-CM | POA: Diagnosis not present

## 2017-02-27 DIAGNOSIS — Z952 Presence of prosthetic heart valve: Secondary | ICD-10-CM

## 2017-02-27 DIAGNOSIS — I9789 Other postprocedural complications and disorders of the circulatory system, not elsewhere classified: Secondary | ICD-10-CM

## 2017-02-27 DIAGNOSIS — I25119 Atherosclerotic heart disease of native coronary artery with unspecified angina pectoris: Secondary | ICD-10-CM | POA: Diagnosis not present

## 2017-02-27 DIAGNOSIS — I4891 Unspecified atrial fibrillation: Secondary | ICD-10-CM

## 2017-02-27 DIAGNOSIS — I2510 Atherosclerotic heart disease of native coronary artery without angina pectoris: Secondary | ICD-10-CM

## 2017-02-27 DIAGNOSIS — I209 Angina pectoris, unspecified: Secondary | ICD-10-CM

## 2017-02-27 NOTE — Patient Instructions (Signed)
Your physician wants you to follow-up in: [place] will receive a reminder letter in the mail two months in advance. If you don't receive a letter, please call our office to schedule the follow-up appointment.  Your physician recommends that you continue on your current medications as directed. Please refer to the Current Medication list given to you today.  Your physician has requested that you have an echocardiogram IN 6 MONTHS JUST PRIOR TO YOUR NEXT VISIT. Echocardiography is a painless test that uses sound waves to create images of your heart. It provides your doctor with information about the size and shape of your heart and how well your heart's chambers and valves are working. This procedure takes approximately one hour. There are no restrictions for this procedure.  Thank you for choosing [facility]!!

## 2017-04-09 DIAGNOSIS — E1165 Type 2 diabetes mellitus with hyperglycemia: Secondary | ICD-10-CM | POA: Diagnosis not present

## 2017-04-09 DIAGNOSIS — M791 Myalgia, unspecified site: Secondary | ICD-10-CM | POA: Diagnosis not present

## 2017-04-09 DIAGNOSIS — Z299 Encounter for prophylactic measures, unspecified: Secondary | ICD-10-CM | POA: Diagnosis not present

## 2017-04-09 DIAGNOSIS — I10 Essential (primary) hypertension: Secondary | ICD-10-CM | POA: Diagnosis not present

## 2017-04-09 DIAGNOSIS — Z6837 Body mass index (BMI) 37.0-37.9, adult: Secondary | ICD-10-CM | POA: Diagnosis not present

## 2017-04-09 DIAGNOSIS — Z79899 Other long term (current) drug therapy: Secondary | ICD-10-CM | POA: Diagnosis not present

## 2017-04-09 DIAGNOSIS — R609 Edema, unspecified: Secondary | ICD-10-CM | POA: Diagnosis not present

## 2017-04-17 DIAGNOSIS — Z79899 Other long term (current) drug therapy: Secondary | ICD-10-CM | POA: Diagnosis not present

## 2017-05-07 DIAGNOSIS — Z79899 Other long term (current) drug therapy: Secondary | ICD-10-CM | POA: Diagnosis not present

## 2017-05-10 ENCOUNTER — Telehealth: Payer: Self-pay | Admitting: Internal Medicine

## 2017-05-10 DIAGNOSIS — E119 Type 2 diabetes mellitus without complications: Secondary | ICD-10-CM | POA: Diagnosis not present

## 2017-05-10 DIAGNOSIS — I10 Essential (primary) hypertension: Secondary | ICD-10-CM | POA: Diagnosis present

## 2017-05-10 DIAGNOSIS — Z6841 Body Mass Index (BMI) 40.0 and over, adult: Secondary | ICD-10-CM | POA: Diagnosis not present

## 2017-05-10 DIAGNOSIS — Z951 Presence of aortocoronary bypass graft: Secondary | ICD-10-CM | POA: Diagnosis not present

## 2017-05-10 DIAGNOSIS — I509 Heart failure, unspecified: Secondary | ICD-10-CM | POA: Diagnosis not present

## 2017-05-10 DIAGNOSIS — I2510 Atherosclerotic heart disease of native coronary artery without angina pectoris: Secondary | ICD-10-CM | POA: Diagnosis present

## 2017-05-10 DIAGNOSIS — I214 Non-ST elevation (NSTEMI) myocardial infarction: Secondary | ICD-10-CM | POA: Diagnosis not present

## 2017-05-10 DIAGNOSIS — E669 Obesity, unspecified: Secondary | ICD-10-CM | POA: Diagnosis present

## 2017-05-10 DIAGNOSIS — R079 Chest pain, unspecified: Secondary | ICD-10-CM | POA: Diagnosis not present

## 2017-05-10 DIAGNOSIS — E039 Hypothyroidism, unspecified: Secondary | ICD-10-CM | POA: Diagnosis not present

## 2017-05-10 DIAGNOSIS — T45515A Adverse effect of anticoagulants, initial encounter: Secondary | ICD-10-CM | POA: Diagnosis present

## 2017-05-10 DIAGNOSIS — Z881 Allergy status to other antibiotic agents status: Secondary | ICD-10-CM | POA: Diagnosis not present

## 2017-05-10 DIAGNOSIS — D649 Anemia, unspecified: Secondary | ICD-10-CM | POA: Diagnosis not present

## 2017-05-10 DIAGNOSIS — Z7982 Long term (current) use of aspirin: Secondary | ICD-10-CM | POA: Diagnosis not present

## 2017-05-10 DIAGNOSIS — K922 Gastrointestinal hemorrhage, unspecified: Secondary | ICD-10-CM | POA: Diagnosis not present

## 2017-05-10 DIAGNOSIS — D6832 Hemorrhagic disorder due to extrinsic circulating anticoagulants: Secondary | ICD-10-CM | POA: Diagnosis not present

## 2017-05-10 DIAGNOSIS — M810 Age-related osteoporosis without current pathological fracture: Secondary | ICD-10-CM | POA: Diagnosis present

## 2017-05-10 DIAGNOSIS — R0602 Shortness of breath: Secondary | ICD-10-CM | POA: Diagnosis not present

## 2017-05-10 DIAGNOSIS — M79606 Pain in leg, unspecified: Secondary | ICD-10-CM | POA: Diagnosis not present

## 2017-05-10 DIAGNOSIS — R0789 Other chest pain: Secondary | ICD-10-CM | POA: Diagnosis not present

## 2017-05-10 DIAGNOSIS — I5023 Acute on chronic systolic (congestive) heart failure: Secondary | ICD-10-CM | POA: Diagnosis not present

## 2017-05-10 DIAGNOSIS — F79 Unspecified intellectual disabilities: Secondary | ICD-10-CM | POA: Diagnosis present

## 2017-05-10 DIAGNOSIS — Z79899 Other long term (current) drug therapy: Secondary | ICD-10-CM | POA: Diagnosis not present

## 2017-05-10 DIAGNOSIS — Z85048 Personal history of other malignant neoplasm of rectum, rectosigmoid junction, and anus: Secondary | ICD-10-CM | POA: Diagnosis not present

## 2017-05-10 DIAGNOSIS — I5021 Acute systolic (congestive) heart failure: Secondary | ICD-10-CM | POA: Diagnosis present

## 2017-05-10 DIAGNOSIS — I4891 Unspecified atrial fibrillation: Secondary | ICD-10-CM | POA: Diagnosis present

## 2017-05-10 DIAGNOSIS — D500 Iron deficiency anemia secondary to blood loss (chronic): Secondary | ICD-10-CM | POA: Diagnosis not present

## 2017-05-10 DIAGNOSIS — L03116 Cellulitis of left lower limb: Secondary | ICD-10-CM | POA: Diagnosis not present

## 2017-05-10 DIAGNOSIS — R040 Epistaxis: Secondary | ICD-10-CM | POA: Diagnosis not present

## 2017-05-10 DIAGNOSIS — R7989 Other specified abnormal findings of blood chemistry: Secondary | ICD-10-CM | POA: Diagnosis not present

## 2017-05-10 DIAGNOSIS — Z888 Allergy status to other drugs, medicaments and biological substances status: Secondary | ICD-10-CM | POA: Diagnosis not present

## 2017-05-10 DIAGNOSIS — I110 Hypertensive heart disease with heart failure: Secondary | ICD-10-CM | POA: Diagnosis not present

## 2017-05-10 DIAGNOSIS — M6281 Muscle weakness (generalized): Secondary | ICD-10-CM | POA: Diagnosis not present

## 2017-05-10 NOTE — Telephone Encounter (Signed)
I discussed  With Dr Manuella Ghazi. Melinda Vang  At [facility]  About  Pt  Admission at ER  And borderline trop elevation upto 0.14,  Pt  Has  Creatinine  upto 1.5 In lieu of  CABg IN 2013 and  Well known pt  To [place] , I ADVISED, pt  Can be  Accepted  To [facility] , after  Med  optimisation  And  Anticoagulation , in lieu of  Symptoms and  Trop  Elevation , No urgent  Need for  Cardiac cath ,  Subsequent plan to eval for  PCI ,  further  eval based on transfer ,  And  Arrival

## 2017-05-15 DIAGNOSIS — D649 Anemia, unspecified: Secondary | ICD-10-CM | POA: Diagnosis not present

## 2017-05-15 DIAGNOSIS — E039 Hypothyroidism, unspecified: Secondary | ICD-10-CM | POA: Diagnosis not present

## 2017-05-15 DIAGNOSIS — I4891 Unspecified atrial fibrillation: Secondary | ICD-10-CM | POA: Diagnosis not present

## 2017-05-15 DIAGNOSIS — I5020 Unspecified systolic (congestive) heart failure: Secondary | ICD-10-CM | POA: Diagnosis not present

## 2017-05-15 DIAGNOSIS — I5021 Acute systolic (congestive) heart failure: Secondary | ICD-10-CM | POA: Diagnosis not present

## 2017-05-15 DIAGNOSIS — K922 Gastrointestinal hemorrhage, unspecified: Secondary | ICD-10-CM | POA: Diagnosis not present

## 2017-05-15 DIAGNOSIS — I214 Non-ST elevation (NSTEMI) myocardial infarction: Secondary | ICD-10-CM | POA: Diagnosis not present

## 2017-05-15 DIAGNOSIS — L03116 Cellulitis of left lower limb: Secondary | ICD-10-CM | POA: Diagnosis not present

## 2017-05-15 DIAGNOSIS — F79 Unspecified intellectual disabilities: Secondary | ICD-10-CM | POA: Diagnosis not present

## 2017-05-15 DIAGNOSIS — I10 Essential (primary) hypertension: Secondary | ICD-10-CM | POA: Diagnosis not present

## 2017-05-15 DIAGNOSIS — J40 Bronchitis, not specified as acute or chronic: Secondary | ICD-10-CM | POA: Diagnosis not present

## 2017-05-15 DIAGNOSIS — M6281 Muscle weakness (generalized): Secondary | ICD-10-CM | POA: Diagnosis not present

## 2017-05-15 DIAGNOSIS — I5031 Acute diastolic (congestive) heart failure: Secondary | ICD-10-CM | POA: Diagnosis not present

## 2017-05-15 DIAGNOSIS — E119 Type 2 diabetes mellitus without complications: Secondary | ICD-10-CM | POA: Diagnosis not present

## 2017-05-15 DIAGNOSIS — I110 Hypertensive heart disease with heart failure: Secondary | ICD-10-CM | POA: Diagnosis not present

## 2017-05-15 DIAGNOSIS — R079 Chest pain, unspecified: Secondary | ICD-10-CM | POA: Diagnosis not present

## 2017-05-15 DIAGNOSIS — R05 Cough: Secondary | ICD-10-CM | POA: Diagnosis not present

## 2017-05-15 DIAGNOSIS — I5022 Chronic systolic (congestive) heart failure: Secondary | ICD-10-CM | POA: Diagnosis not present

## 2017-05-16 DIAGNOSIS — E119 Type 2 diabetes mellitus without complications: Secondary | ICD-10-CM | POA: Diagnosis not present

## 2017-05-16 DIAGNOSIS — I214 Non-ST elevation (NSTEMI) myocardial infarction: Secondary | ICD-10-CM | POA: Diagnosis not present

## 2017-05-16 DIAGNOSIS — I5031 Acute diastolic (congestive) heart failure: Secondary | ICD-10-CM | POA: Diagnosis not present

## 2017-05-16 DIAGNOSIS — I10 Essential (primary) hypertension: Secondary | ICD-10-CM | POA: Diagnosis not present

## 2017-05-28 DIAGNOSIS — I5022 Chronic systolic (congestive) heart failure: Secondary | ICD-10-CM | POA: Diagnosis not present

## 2017-05-28 DIAGNOSIS — E119 Type 2 diabetes mellitus without complications: Secondary | ICD-10-CM | POA: Diagnosis not present

## 2017-05-28 DIAGNOSIS — J40 Bronchitis, not specified as acute or chronic: Secondary | ICD-10-CM | POA: Diagnosis not present

## 2017-06-04 DIAGNOSIS — E119 Type 2 diabetes mellitus without complications: Secondary | ICD-10-CM | POA: Diagnosis not present

## 2017-06-04 DIAGNOSIS — I5020 Unspecified systolic (congestive) heart failure: Secondary | ICD-10-CM | POA: Diagnosis not present

## 2017-06-04 DIAGNOSIS — J40 Bronchitis, not specified as acute or chronic: Secondary | ICD-10-CM | POA: Diagnosis not present

## 2017-06-05 DIAGNOSIS — Z7984 Long term (current) use of oral hypoglycemic drugs: Secondary | ICD-10-CM | POA: Diagnosis not present

## 2017-06-05 DIAGNOSIS — Z951 Presence of aortocoronary bypass graft: Secondary | ICD-10-CM | POA: Diagnosis not present

## 2017-06-05 DIAGNOSIS — F79 Unspecified intellectual disabilities: Secondary | ICD-10-CM | POA: Diagnosis not present

## 2017-06-05 DIAGNOSIS — Z9181 History of falling: Secondary | ICD-10-CM | POA: Diagnosis not present

## 2017-06-05 DIAGNOSIS — I2510 Atherosclerotic heart disease of native coronary artery without angina pectoris: Secondary | ICD-10-CM | POA: Diagnosis not present

## 2017-06-05 DIAGNOSIS — E119 Type 2 diabetes mellitus without complications: Secondary | ICD-10-CM | POA: Diagnosis not present

## 2017-06-05 DIAGNOSIS — M6281 Muscle weakness (generalized): Secondary | ICD-10-CM | POA: Diagnosis not present

## 2017-06-05 DIAGNOSIS — I110 Hypertensive heart disease with heart failure: Secondary | ICD-10-CM | POA: Diagnosis not present

## 2017-06-05 DIAGNOSIS — Z7982 Long term (current) use of aspirin: Secondary | ICD-10-CM | POA: Diagnosis not present

## 2017-06-05 DIAGNOSIS — I214 Non-ST elevation (NSTEMI) myocardial infarction: Secondary | ICD-10-CM | POA: Diagnosis not present

## 2017-06-05 DIAGNOSIS — I872 Venous insufficiency (chronic) (peripheral): Secondary | ICD-10-CM | POA: Diagnosis not present

## 2017-06-05 DIAGNOSIS — I5031 Acute diastolic (congestive) heart failure: Secondary | ICD-10-CM | POA: Diagnosis not present

## 2017-06-08 DIAGNOSIS — I214 Non-ST elevation (NSTEMI) myocardial infarction: Secondary | ICD-10-CM | POA: Diagnosis not present

## 2017-06-08 DIAGNOSIS — E119 Type 2 diabetes mellitus without complications: Secondary | ICD-10-CM | POA: Diagnosis not present

## 2017-06-08 DIAGNOSIS — I5031 Acute diastolic (congestive) heart failure: Secondary | ICD-10-CM | POA: Diagnosis not present

## 2017-06-08 DIAGNOSIS — M6281 Muscle weakness (generalized): Secondary | ICD-10-CM | POA: Diagnosis not present

## 2017-06-08 DIAGNOSIS — I110 Hypertensive heart disease with heart failure: Secondary | ICD-10-CM | POA: Diagnosis not present

## 2017-06-08 DIAGNOSIS — F79 Unspecified intellectual disabilities: Secondary | ICD-10-CM | POA: Diagnosis not present

## 2017-06-09 DIAGNOSIS — F79 Unspecified intellectual disabilities: Secondary | ICD-10-CM | POA: Diagnosis not present

## 2017-06-09 DIAGNOSIS — E119 Type 2 diabetes mellitus without complications: Secondary | ICD-10-CM | POA: Diagnosis not present

## 2017-06-09 DIAGNOSIS — I5031 Acute diastolic (congestive) heart failure: Secondary | ICD-10-CM | POA: Diagnosis not present

## 2017-06-09 DIAGNOSIS — M6281 Muscle weakness (generalized): Secondary | ICD-10-CM | POA: Diagnosis not present

## 2017-06-09 DIAGNOSIS — I214 Non-ST elevation (NSTEMI) myocardial infarction: Secondary | ICD-10-CM | POA: Diagnosis not present

## 2017-06-09 DIAGNOSIS — I110 Hypertensive heart disease with heart failure: Secondary | ICD-10-CM | POA: Diagnosis not present

## 2017-06-10 DIAGNOSIS — F79 Unspecified intellectual disabilities: Secondary | ICD-10-CM | POA: Diagnosis not present

## 2017-06-10 DIAGNOSIS — I214 Non-ST elevation (NSTEMI) myocardial infarction: Secondary | ICD-10-CM | POA: Diagnosis not present

## 2017-06-10 DIAGNOSIS — I110 Hypertensive heart disease with heart failure: Secondary | ICD-10-CM | POA: Diagnosis not present

## 2017-06-10 DIAGNOSIS — M6281 Muscle weakness (generalized): Secondary | ICD-10-CM | POA: Diagnosis not present

## 2017-06-10 DIAGNOSIS — I5031 Acute diastolic (congestive) heart failure: Secondary | ICD-10-CM | POA: Diagnosis not present

## 2017-06-10 DIAGNOSIS — E119 Type 2 diabetes mellitus without complications: Secondary | ICD-10-CM | POA: Diagnosis not present

## 2017-06-11 DIAGNOSIS — F79 Unspecified intellectual disabilities: Secondary | ICD-10-CM | POA: Diagnosis not present

## 2017-06-11 DIAGNOSIS — I110 Hypertensive heart disease with heart failure: Secondary | ICD-10-CM | POA: Diagnosis not present

## 2017-06-11 DIAGNOSIS — M6281 Muscle weakness (generalized): Secondary | ICD-10-CM | POA: Diagnosis not present

## 2017-06-11 DIAGNOSIS — I5031 Acute diastolic (congestive) heart failure: Secondary | ICD-10-CM | POA: Diagnosis not present

## 2017-06-11 DIAGNOSIS — E119 Type 2 diabetes mellitus without complications: Secondary | ICD-10-CM | POA: Diagnosis not present

## 2017-06-11 DIAGNOSIS — I214 Non-ST elevation (NSTEMI) myocardial infarction: Secondary | ICD-10-CM | POA: Diagnosis not present

## 2017-06-12 DIAGNOSIS — I5022 Chronic systolic (congestive) heart failure: Secondary | ICD-10-CM | POA: Diagnosis not present

## 2017-06-12 DIAGNOSIS — I5031 Acute diastolic (congestive) heart failure: Secondary | ICD-10-CM | POA: Diagnosis not present

## 2017-06-12 DIAGNOSIS — M6281 Muscle weakness (generalized): Secondary | ICD-10-CM | POA: Diagnosis not present

## 2017-06-12 DIAGNOSIS — E1165 Type 2 diabetes mellitus with hyperglycemia: Secondary | ICD-10-CM | POA: Diagnosis not present

## 2017-06-12 DIAGNOSIS — L97909 Non-pressure chronic ulcer of unspecified part of unspecified lower leg with unspecified severity: Secondary | ICD-10-CM | POA: Diagnosis not present

## 2017-06-12 DIAGNOSIS — I10 Essential (primary) hypertension: Secondary | ICD-10-CM | POA: Diagnosis not present

## 2017-06-12 DIAGNOSIS — E039 Hypothyroidism, unspecified: Secondary | ICD-10-CM | POA: Diagnosis not present

## 2017-06-12 DIAGNOSIS — I4891 Unspecified atrial fibrillation: Secondary | ICD-10-CM | POA: Diagnosis not present

## 2017-06-12 DIAGNOSIS — E119 Type 2 diabetes mellitus without complications: Secondary | ICD-10-CM | POA: Diagnosis not present

## 2017-06-12 DIAGNOSIS — I83009 Varicose veins of unspecified lower extremity with ulcer of unspecified site: Secondary | ICD-10-CM | POA: Diagnosis not present

## 2017-06-12 DIAGNOSIS — E7800 Pure hypercholesterolemia, unspecified: Secondary | ICD-10-CM | POA: Diagnosis not present

## 2017-06-12 DIAGNOSIS — I110 Hypertensive heart disease with heart failure: Secondary | ICD-10-CM | POA: Diagnosis not present

## 2017-06-12 DIAGNOSIS — Z6837 Body mass index (BMI) 37.0-37.9, adult: Secondary | ICD-10-CM | POA: Diagnosis not present

## 2017-06-12 DIAGNOSIS — Z299 Encounter for prophylactic measures, unspecified: Secondary | ICD-10-CM | POA: Diagnosis not present

## 2017-06-12 DIAGNOSIS — I214 Non-ST elevation (NSTEMI) myocardial infarction: Secondary | ICD-10-CM | POA: Diagnosis not present

## 2017-06-12 DIAGNOSIS — F79 Unspecified intellectual disabilities: Secondary | ICD-10-CM | POA: Diagnosis not present

## 2017-06-15 DIAGNOSIS — I5031 Acute diastolic (congestive) heart failure: Secondary | ICD-10-CM | POA: Diagnosis not present

## 2017-06-15 DIAGNOSIS — F79 Unspecified intellectual disabilities: Secondary | ICD-10-CM | POA: Diagnosis not present

## 2017-06-15 DIAGNOSIS — I110 Hypertensive heart disease with heart failure: Secondary | ICD-10-CM | POA: Diagnosis not present

## 2017-06-15 DIAGNOSIS — I214 Non-ST elevation (NSTEMI) myocardial infarction: Secondary | ICD-10-CM | POA: Diagnosis not present

## 2017-06-15 DIAGNOSIS — M6281 Muscle weakness (generalized): Secondary | ICD-10-CM | POA: Diagnosis not present

## 2017-06-15 DIAGNOSIS — E119 Type 2 diabetes mellitus without complications: Secondary | ICD-10-CM | POA: Diagnosis not present

## 2017-06-17 DIAGNOSIS — I5031 Acute diastolic (congestive) heart failure: Secondary | ICD-10-CM | POA: Diagnosis not present

## 2017-06-17 DIAGNOSIS — I110 Hypertensive heart disease with heart failure: Secondary | ICD-10-CM | POA: Diagnosis not present

## 2017-06-17 DIAGNOSIS — I214 Non-ST elevation (NSTEMI) myocardial infarction: Secondary | ICD-10-CM | POA: Diagnosis not present

## 2017-06-17 DIAGNOSIS — M6281 Muscle weakness (generalized): Secondary | ICD-10-CM | POA: Diagnosis not present

## 2017-06-17 DIAGNOSIS — E119 Type 2 diabetes mellitus without complications: Secondary | ICD-10-CM | POA: Diagnosis not present

## 2017-06-17 DIAGNOSIS — F79 Unspecified intellectual disabilities: Secondary | ICD-10-CM | POA: Diagnosis not present

## 2017-06-19 DIAGNOSIS — I5031 Acute diastolic (congestive) heart failure: Secondary | ICD-10-CM | POA: Diagnosis not present

## 2017-06-19 DIAGNOSIS — E119 Type 2 diabetes mellitus without complications: Secondary | ICD-10-CM | POA: Diagnosis not present

## 2017-06-19 DIAGNOSIS — M6281 Muscle weakness (generalized): Secondary | ICD-10-CM | POA: Diagnosis not present

## 2017-06-19 DIAGNOSIS — I214 Non-ST elevation (NSTEMI) myocardial infarction: Secondary | ICD-10-CM | POA: Diagnosis not present

## 2017-06-19 DIAGNOSIS — F79 Unspecified intellectual disabilities: Secondary | ICD-10-CM | POA: Diagnosis not present

## 2017-06-19 DIAGNOSIS — I110 Hypertensive heart disease with heart failure: Secondary | ICD-10-CM | POA: Diagnosis not present

## 2017-06-22 DIAGNOSIS — I214 Non-ST elevation (NSTEMI) myocardial infarction: Secondary | ICD-10-CM | POA: Diagnosis not present

## 2017-06-22 DIAGNOSIS — E119 Type 2 diabetes mellitus without complications: Secondary | ICD-10-CM | POA: Diagnosis not present

## 2017-06-22 DIAGNOSIS — M6281 Muscle weakness (generalized): Secondary | ICD-10-CM | POA: Diagnosis not present

## 2017-06-22 DIAGNOSIS — I110 Hypertensive heart disease with heart failure: Secondary | ICD-10-CM | POA: Diagnosis not present

## 2017-06-22 DIAGNOSIS — F79 Unspecified intellectual disabilities: Secondary | ICD-10-CM | POA: Diagnosis not present

## 2017-06-22 DIAGNOSIS — I5031 Acute diastolic (congestive) heart failure: Secondary | ICD-10-CM | POA: Diagnosis not present

## 2017-06-23 DIAGNOSIS — I110 Hypertensive heart disease with heart failure: Secondary | ICD-10-CM | POA: Diagnosis not present

## 2017-06-23 DIAGNOSIS — I214 Non-ST elevation (NSTEMI) myocardial infarction: Secondary | ICD-10-CM | POA: Diagnosis not present

## 2017-06-23 DIAGNOSIS — I5031 Acute diastolic (congestive) heart failure: Secondary | ICD-10-CM | POA: Diagnosis not present

## 2017-06-23 DIAGNOSIS — E119 Type 2 diabetes mellitus without complications: Secondary | ICD-10-CM | POA: Diagnosis not present

## 2017-06-23 DIAGNOSIS — F79 Unspecified intellectual disabilities: Secondary | ICD-10-CM | POA: Diagnosis not present

## 2017-06-23 DIAGNOSIS — M6281 Muscle weakness (generalized): Secondary | ICD-10-CM | POA: Diagnosis not present

## 2017-06-24 DIAGNOSIS — I5031 Acute diastolic (congestive) heart failure: Secondary | ICD-10-CM | POA: Diagnosis not present

## 2017-06-24 DIAGNOSIS — F79 Unspecified intellectual disabilities: Secondary | ICD-10-CM | POA: Diagnosis not present

## 2017-06-24 DIAGNOSIS — I214 Non-ST elevation (NSTEMI) myocardial infarction: Secondary | ICD-10-CM | POA: Diagnosis not present

## 2017-06-24 DIAGNOSIS — I110 Hypertensive heart disease with heart failure: Secondary | ICD-10-CM | POA: Diagnosis not present

## 2017-06-24 DIAGNOSIS — E119 Type 2 diabetes mellitus without complications: Secondary | ICD-10-CM | POA: Diagnosis not present

## 2017-06-24 DIAGNOSIS — M6281 Muscle weakness (generalized): Secondary | ICD-10-CM | POA: Diagnosis not present

## 2017-06-26 DIAGNOSIS — I110 Hypertensive heart disease with heart failure: Secondary | ICD-10-CM | POA: Diagnosis not present

## 2017-06-26 DIAGNOSIS — M6281 Muscle weakness (generalized): Secondary | ICD-10-CM | POA: Diagnosis not present

## 2017-06-26 DIAGNOSIS — I5031 Acute diastolic (congestive) heart failure: Secondary | ICD-10-CM | POA: Diagnosis not present

## 2017-06-26 DIAGNOSIS — F79 Unspecified intellectual disabilities: Secondary | ICD-10-CM | POA: Diagnosis not present

## 2017-06-26 DIAGNOSIS — E119 Type 2 diabetes mellitus without complications: Secondary | ICD-10-CM | POA: Diagnosis not present

## 2017-06-26 DIAGNOSIS — I214 Non-ST elevation (NSTEMI) myocardial infarction: Secondary | ICD-10-CM | POA: Diagnosis not present

## 2017-06-29 DIAGNOSIS — I5031 Acute diastolic (congestive) heart failure: Secondary | ICD-10-CM | POA: Diagnosis not present

## 2017-06-29 DIAGNOSIS — M6281 Muscle weakness (generalized): Secondary | ICD-10-CM | POA: Diagnosis not present

## 2017-06-29 DIAGNOSIS — E119 Type 2 diabetes mellitus without complications: Secondary | ICD-10-CM | POA: Diagnosis not present

## 2017-06-29 DIAGNOSIS — I214 Non-ST elevation (NSTEMI) myocardial infarction: Secondary | ICD-10-CM | POA: Diagnosis not present

## 2017-06-29 DIAGNOSIS — F79 Unspecified intellectual disabilities: Secondary | ICD-10-CM | POA: Diagnosis not present

## 2017-06-29 DIAGNOSIS — I110 Hypertensive heart disease with heart failure: Secondary | ICD-10-CM | POA: Diagnosis not present

## 2017-07-01 DIAGNOSIS — I5031 Acute diastolic (congestive) heart failure: Secondary | ICD-10-CM | POA: Diagnosis not present

## 2017-07-01 DIAGNOSIS — M6281 Muscle weakness (generalized): Secondary | ICD-10-CM | POA: Diagnosis not present

## 2017-07-01 DIAGNOSIS — I110 Hypertensive heart disease with heart failure: Secondary | ICD-10-CM | POA: Diagnosis not present

## 2017-07-01 DIAGNOSIS — E119 Type 2 diabetes mellitus without complications: Secondary | ICD-10-CM | POA: Diagnosis not present

## 2017-07-01 DIAGNOSIS — I214 Non-ST elevation (NSTEMI) myocardial infarction: Secondary | ICD-10-CM | POA: Diagnosis not present

## 2017-07-01 DIAGNOSIS — F79 Unspecified intellectual disabilities: Secondary | ICD-10-CM | POA: Diagnosis not present

## 2017-07-02 DIAGNOSIS — I110 Hypertensive heart disease with heart failure: Secondary | ICD-10-CM | POA: Diagnosis not present

## 2017-07-02 DIAGNOSIS — I214 Non-ST elevation (NSTEMI) myocardial infarction: Secondary | ICD-10-CM | POA: Diagnosis not present

## 2017-07-02 DIAGNOSIS — M6281 Muscle weakness (generalized): Secondary | ICD-10-CM | POA: Diagnosis not present

## 2017-07-02 DIAGNOSIS — I5031 Acute diastolic (congestive) heart failure: Secondary | ICD-10-CM | POA: Diagnosis not present

## 2017-07-02 DIAGNOSIS — E119 Type 2 diabetes mellitus without complications: Secondary | ICD-10-CM | POA: Diagnosis not present

## 2017-07-02 DIAGNOSIS — F79 Unspecified intellectual disabilities: Secondary | ICD-10-CM | POA: Diagnosis not present

## 2017-07-07 DIAGNOSIS — I214 Non-ST elevation (NSTEMI) myocardial infarction: Secondary | ICD-10-CM | POA: Diagnosis not present

## 2017-07-07 DIAGNOSIS — I5031 Acute diastolic (congestive) heart failure: Secondary | ICD-10-CM | POA: Diagnosis not present

## 2017-07-07 DIAGNOSIS — E119 Type 2 diabetes mellitus without complications: Secondary | ICD-10-CM | POA: Diagnosis not present

## 2017-07-07 DIAGNOSIS — F79 Unspecified intellectual disabilities: Secondary | ICD-10-CM | POA: Diagnosis not present

## 2017-07-07 DIAGNOSIS — I110 Hypertensive heart disease with heart failure: Secondary | ICD-10-CM | POA: Diagnosis not present

## 2017-07-07 DIAGNOSIS — M6281 Muscle weakness (generalized): Secondary | ICD-10-CM | POA: Diagnosis not present

## 2017-07-08 DIAGNOSIS — I214 Non-ST elevation (NSTEMI) myocardial infarction: Secondary | ICD-10-CM | POA: Diagnosis not present

## 2017-07-08 DIAGNOSIS — I110 Hypertensive heart disease with heart failure: Secondary | ICD-10-CM | POA: Diagnosis not present

## 2017-07-08 DIAGNOSIS — I5031 Acute diastolic (congestive) heart failure: Secondary | ICD-10-CM | POA: Diagnosis not present

## 2017-07-08 DIAGNOSIS — M6281 Muscle weakness (generalized): Secondary | ICD-10-CM | POA: Diagnosis not present

## 2017-07-08 DIAGNOSIS — F79 Unspecified intellectual disabilities: Secondary | ICD-10-CM | POA: Diagnosis not present

## 2017-07-08 DIAGNOSIS — E119 Type 2 diabetes mellitus without complications: Secondary | ICD-10-CM | POA: Diagnosis not present

## 2017-07-23 DIAGNOSIS — E0590 Thyrotoxicosis, unspecified without thyrotoxic crisis or storm: Secondary | ICD-10-CM | POA: Diagnosis not present

## 2017-07-23 DIAGNOSIS — R609 Edema, unspecified: Secondary | ICD-10-CM | POA: Diagnosis not present

## 2017-07-23 DIAGNOSIS — Z299 Encounter for prophylactic measures, unspecified: Secondary | ICD-10-CM | POA: Diagnosis not present

## 2017-07-23 DIAGNOSIS — I5022 Chronic systolic (congestive) heart failure: Secondary | ICD-10-CM | POA: Diagnosis not present

## 2017-07-23 DIAGNOSIS — I10 Essential (primary) hypertension: Secondary | ICD-10-CM | POA: Diagnosis not present

## 2017-07-23 DIAGNOSIS — I4891 Unspecified atrial fibrillation: Secondary | ICD-10-CM | POA: Diagnosis not present

## 2017-07-23 DIAGNOSIS — E1165 Type 2 diabetes mellitus with hyperglycemia: Secondary | ICD-10-CM | POA: Diagnosis not present

## 2017-07-23 DIAGNOSIS — Z6836 Body mass index (BMI) 36.0-36.9, adult: Secondary | ICD-10-CM | POA: Diagnosis not present

## 2017-09-01 DIAGNOSIS — Z79899 Other long term (current) drug therapy: Secondary | ICD-10-CM | POA: Diagnosis not present

## 2017-09-01 DIAGNOSIS — Z7189 Other specified counseling: Secondary | ICD-10-CM | POA: Diagnosis not present

## 2017-09-01 DIAGNOSIS — Z6836 Body mass index (BMI) 36.0-36.9, adult: Secondary | ICD-10-CM | POA: Diagnosis not present

## 2017-09-01 DIAGNOSIS — R5383 Other fatigue: Secondary | ICD-10-CM | POA: Diagnosis not present

## 2017-09-01 DIAGNOSIS — I10 Essential (primary) hypertension: Secondary | ICD-10-CM | POA: Diagnosis not present

## 2017-09-01 DIAGNOSIS — Z299 Encounter for prophylactic measures, unspecified: Secondary | ICD-10-CM | POA: Diagnosis not present

## 2017-09-01 DIAGNOSIS — E7800 Pure hypercholesterolemia, unspecified: Secondary | ICD-10-CM | POA: Diagnosis not present

## 2017-09-01 DIAGNOSIS — E039 Hypothyroidism, unspecified: Secondary | ICD-10-CM | POA: Diagnosis not present

## 2017-09-01 DIAGNOSIS — Z0000 Encounter for general adult medical examination without abnormal findings: Secondary | ICD-10-CM | POA: Diagnosis not present

## 2017-09-01 DIAGNOSIS — Z1339 Encounter for screening examination for other mental health and behavioral disorders: Secondary | ICD-10-CM | POA: Diagnosis not present

## 2017-09-01 DIAGNOSIS — Z1331 Encounter for screening for depression: Secondary | ICD-10-CM | POA: Diagnosis not present

## 2017-09-10 ENCOUNTER — Other Ambulatory Visit: Payer: Self-pay

## 2017-09-10 ENCOUNTER — Ambulatory Visit (INDEPENDENT_AMBULATORY_CARE_PROVIDER_SITE_OTHER): Payer: Medicare Other

## 2017-09-10 DIAGNOSIS — Z952 Presence of prosthetic heart valve: Secondary | ICD-10-CM

## 2017-09-11 ENCOUNTER — Telehealth: Payer: Self-pay | Admitting: *Deleted

## 2017-09-11 NOTE — Telephone Encounter (Signed)
-----   Message from Satira Sark, MD sent at 09/11/2017  8:20 AM EDT ----- Results reviewed.  LVEF vigorous at 65 to 70%.  Bioprosthetic aortic valve visualized with mean gradient 23 mmHg, reportedly within normal range for that specific valve.  We will continue with observation at this time. A copy of this test should be forwarded to Glenda Chroman, MD.

## 2017-09-11 NOTE — Telephone Encounter (Signed)
Patient's sister informed Marcie Bal) informed and copy sent to PCP.

## 2017-10-29 DIAGNOSIS — Z299 Encounter for prophylactic measures, unspecified: Secondary | ICD-10-CM | POA: Diagnosis not present

## 2017-10-29 DIAGNOSIS — Z6836 Body mass index (BMI) 36.0-36.9, adult: Secondary | ICD-10-CM | POA: Diagnosis not present

## 2017-10-29 DIAGNOSIS — Z789 Other specified health status: Secondary | ICD-10-CM | POA: Diagnosis not present

## 2017-10-29 DIAGNOSIS — I5022 Chronic systolic (congestive) heart failure: Secondary | ICD-10-CM | POA: Diagnosis not present

## 2017-10-29 DIAGNOSIS — E1122 Type 2 diabetes mellitus with diabetic chronic kidney disease: Secondary | ICD-10-CM | POA: Diagnosis not present

## 2017-10-29 DIAGNOSIS — I10 Essential (primary) hypertension: Secondary | ICD-10-CM | POA: Diagnosis not present

## 2017-10-29 DIAGNOSIS — N184 Chronic kidney disease, stage 4 (severe): Secondary | ICD-10-CM | POA: Diagnosis not present

## 2017-10-29 DIAGNOSIS — E1165 Type 2 diabetes mellitus with hyperglycemia: Secondary | ICD-10-CM | POA: Diagnosis not present

## 2018-04-20 DIAGNOSIS — Z299 Encounter for prophylactic measures, unspecified: Secondary | ICD-10-CM | POA: Diagnosis not present

## 2018-04-20 DIAGNOSIS — I4891 Unspecified atrial fibrillation: Secondary | ICD-10-CM | POA: Diagnosis not present

## 2018-04-20 DIAGNOSIS — I10 Essential (primary) hypertension: Secondary | ICD-10-CM | POA: Diagnosis not present

## 2018-04-20 DIAGNOSIS — E1122 Type 2 diabetes mellitus with diabetic chronic kidney disease: Secondary | ICD-10-CM | POA: Diagnosis not present

## 2018-04-20 DIAGNOSIS — I5022 Chronic systolic (congestive) heart failure: Secondary | ICD-10-CM | POA: Diagnosis not present

## 2018-04-20 DIAGNOSIS — Z6835 Body mass index (BMI) 35.0-35.9, adult: Secondary | ICD-10-CM | POA: Diagnosis not present

## 2018-04-20 DIAGNOSIS — E1165 Type 2 diabetes mellitus with hyperglycemia: Secondary | ICD-10-CM | POA: Diagnosis not present

## 2018-09-09 DIAGNOSIS — Z79899 Other long term (current) drug therapy: Secondary | ICD-10-CM | POA: Diagnosis not present

## 2018-09-09 DIAGNOSIS — Z299 Encounter for prophylactic measures, unspecified: Secondary | ICD-10-CM | POA: Diagnosis not present

## 2018-09-09 DIAGNOSIS — Z1331 Encounter for screening for depression: Secondary | ICD-10-CM | POA: Diagnosis not present

## 2018-09-09 DIAGNOSIS — E039 Hypothyroidism, unspecified: Secondary | ICD-10-CM | POA: Diagnosis not present

## 2018-09-09 DIAGNOSIS — Z1339 Encounter for screening examination for other mental health and behavioral disorders: Secondary | ICD-10-CM | POA: Diagnosis not present

## 2018-09-09 DIAGNOSIS — Z6835 Body mass index (BMI) 35.0-35.9, adult: Secondary | ICD-10-CM | POA: Diagnosis not present

## 2018-09-09 DIAGNOSIS — E1165 Type 2 diabetes mellitus with hyperglycemia: Secondary | ICD-10-CM | POA: Diagnosis not present

## 2018-09-09 DIAGNOSIS — I10 Essential (primary) hypertension: Secondary | ICD-10-CM | POA: Diagnosis not present

## 2018-09-09 DIAGNOSIS — Z7189 Other specified counseling: Secondary | ICD-10-CM | POA: Diagnosis not present

## 2018-09-09 DIAGNOSIS — E7800 Pure hypercholesterolemia, unspecified: Secondary | ICD-10-CM | POA: Diagnosis not present

## 2018-09-09 DIAGNOSIS — I5022 Chronic systolic (congestive) heart failure: Secondary | ICD-10-CM | POA: Diagnosis not present

## 2018-09-09 DIAGNOSIS — R5383 Other fatigue: Secondary | ICD-10-CM | POA: Diagnosis not present

## 2018-09-09 DIAGNOSIS — Z0000 Encounter for general adult medical examination without abnormal findings: Secondary | ICD-10-CM | POA: Diagnosis not present

## 2019-06-15 DIAGNOSIS — E1165 Type 2 diabetes mellitus with hyperglycemia: Secondary | ICD-10-CM | POA: Diagnosis not present

## 2019-06-15 DIAGNOSIS — Z6836 Body mass index (BMI) 36.0-36.9, adult: Secondary | ICD-10-CM | POA: Diagnosis not present

## 2019-06-15 DIAGNOSIS — I5022 Chronic systolic (congestive) heart failure: Secondary | ICD-10-CM | POA: Diagnosis not present

## 2019-06-15 DIAGNOSIS — L98499 Non-pressure chronic ulcer of skin of other sites with unspecified severity: Secondary | ICD-10-CM | POA: Diagnosis not present

## 2019-06-15 DIAGNOSIS — I4891 Unspecified atrial fibrillation: Secondary | ICD-10-CM | POA: Diagnosis not present

## 2019-06-15 DIAGNOSIS — Z299 Encounter for prophylactic measures, unspecified: Secondary | ICD-10-CM | POA: Diagnosis not present

## 2019-06-15 DIAGNOSIS — I10 Essential (primary) hypertension: Secondary | ICD-10-CM | POA: Diagnosis not present

## 2019-08-12 DIAGNOSIS — R079 Chest pain, unspecified: Secondary | ICD-10-CM | POA: Diagnosis not present

## 2019-08-12 DIAGNOSIS — Z7982 Long term (current) use of aspirin: Secondary | ICD-10-CM | POA: Diagnosis not present

## 2019-08-12 DIAGNOSIS — R7989 Other specified abnormal findings of blood chemistry: Secondary | ICD-10-CM | POA: Diagnosis not present

## 2019-08-12 DIAGNOSIS — Z952 Presence of prosthetic heart valve: Secondary | ICD-10-CM | POA: Diagnosis not present

## 2019-08-12 DIAGNOSIS — Z20822 Contact with and (suspected) exposure to covid-19: Secondary | ICD-10-CM | POA: Diagnosis not present

## 2019-08-12 DIAGNOSIS — Z955 Presence of coronary angioplasty implant and graft: Secondary | ICD-10-CM | POA: Diagnosis not present

## 2019-08-12 DIAGNOSIS — R0602 Shortness of breath: Secondary | ICD-10-CM | POA: Diagnosis not present

## 2019-08-12 DIAGNOSIS — R778 Other specified abnormalities of plasma proteins: Secondary | ICD-10-CM | POA: Diagnosis not present

## 2019-08-12 DIAGNOSIS — R110 Nausea: Secondary | ICD-10-CM | POA: Diagnosis not present

## 2019-08-12 DIAGNOSIS — R790 Abnormal level of blood mineral: Secondary | ICD-10-CM | POA: Diagnosis not present

## 2019-08-12 DIAGNOSIS — Z85048 Personal history of other malignant neoplasm of rectum, rectosigmoid junction, and anus: Secondary | ICD-10-CM | POA: Diagnosis not present

## 2019-08-12 DIAGNOSIS — E119 Type 2 diabetes mellitus without complications: Secondary | ICD-10-CM | POA: Diagnosis not present

## 2019-08-12 DIAGNOSIS — I110 Hypertensive heart disease with heart failure: Secondary | ICD-10-CM | POA: Diagnosis not present

## 2019-08-12 DIAGNOSIS — Z79899 Other long term (current) drug therapy: Secondary | ICD-10-CM | POA: Diagnosis not present

## 2019-08-12 DIAGNOSIS — I4891 Unspecified atrial fibrillation: Secondary | ICD-10-CM | POA: Diagnosis not present

## 2019-08-12 DIAGNOSIS — R200 Anesthesia of skin: Secondary | ICD-10-CM | POA: Diagnosis not present

## 2019-08-12 DIAGNOSIS — Z7984 Long term (current) use of oral hypoglycemic drugs: Secondary | ICD-10-CM | POA: Diagnosis not present

## 2019-08-12 DIAGNOSIS — R000 Tachycardia, unspecified: Secondary | ICD-10-CM | POA: Diagnosis not present

## 2019-08-12 DIAGNOSIS — I509 Heart failure, unspecified: Secondary | ICD-10-CM | POA: Diagnosis not present

## 2019-08-13 DIAGNOSIS — N189 Chronic kidney disease, unspecified: Secondary | ICD-10-CM | POA: Diagnosis present

## 2019-08-13 DIAGNOSIS — E119 Type 2 diabetes mellitus without complications: Secondary | ICD-10-CM | POA: Diagnosis not present

## 2019-08-13 DIAGNOSIS — Z20822 Contact with and (suspected) exposure to covid-19: Secondary | ICD-10-CM | POA: Diagnosis not present

## 2019-08-13 DIAGNOSIS — R079 Chest pain, unspecified: Secondary | ICD-10-CM | POA: Diagnosis not present

## 2019-08-13 DIAGNOSIS — Z951 Presence of aortocoronary bypass graft: Secondary | ICD-10-CM | POA: Diagnosis not present

## 2019-08-13 DIAGNOSIS — R0602 Shortness of breath: Secondary | ICD-10-CM | POA: Diagnosis not present

## 2019-08-13 DIAGNOSIS — I272 Pulmonary hypertension, unspecified: Secondary | ICD-10-CM | POA: Diagnosis not present

## 2019-08-13 DIAGNOSIS — I10 Essential (primary) hypertension: Secondary | ICD-10-CM | POA: Diagnosis not present

## 2019-08-13 DIAGNOSIS — Z952 Presence of prosthetic heart valve: Secondary | ICD-10-CM | POA: Diagnosis not present

## 2019-08-13 DIAGNOSIS — E1122 Type 2 diabetes mellitus with diabetic chronic kidney disease: Secondary | ICD-10-CM | POA: Diagnosis present

## 2019-08-13 DIAGNOSIS — I248 Other forms of acute ischemic heart disease: Secondary | ICD-10-CM | POA: Diagnosis present

## 2019-08-13 DIAGNOSIS — I130 Hypertensive heart and chronic kidney disease with heart failure and stage 1 through stage 4 chronic kidney disease, or unspecified chronic kidney disease: Secondary | ICD-10-CM | POA: Diagnosis present

## 2019-08-13 DIAGNOSIS — Z953 Presence of xenogenic heart valve: Secondary | ICD-10-CM | POA: Diagnosis not present

## 2019-08-13 DIAGNOSIS — I5032 Chronic diastolic (congestive) heart failure: Secondary | ICD-10-CM | POA: Diagnosis present

## 2019-08-13 DIAGNOSIS — Z859 Personal history of malignant neoplasm, unspecified: Secondary | ICD-10-CM | POA: Diagnosis not present

## 2019-08-13 DIAGNOSIS — E039 Hypothyroidism, unspecified: Secondary | ICD-10-CM | POA: Diagnosis present

## 2019-08-13 DIAGNOSIS — I2510 Atherosclerotic heart disease of native coronary artery without angina pectoris: Secondary | ICD-10-CM | POA: Diagnosis present

## 2019-08-13 DIAGNOSIS — E785 Hyperlipidemia, unspecified: Secondary | ICD-10-CM | POA: Diagnosis present

## 2019-08-13 DIAGNOSIS — R790 Abnormal level of blood mineral: Secondary | ICD-10-CM | POA: Diagnosis not present

## 2019-08-13 DIAGNOSIS — I350 Nonrheumatic aortic (valve) stenosis: Secondary | ICD-10-CM | POA: Diagnosis not present

## 2019-08-13 DIAGNOSIS — R200 Anesthesia of skin: Secondary | ICD-10-CM | POA: Diagnosis not present

## 2019-08-13 DIAGNOSIS — I4891 Unspecified atrial fibrillation: Secondary | ICD-10-CM | POA: Diagnosis present

## 2019-08-13 DIAGNOSIS — Z87891 Personal history of nicotine dependence: Secondary | ICD-10-CM | POA: Diagnosis not present

## 2019-08-13 DIAGNOSIS — N179 Acute kidney failure, unspecified: Secondary | ICD-10-CM | POA: Diagnosis not present

## 2019-08-13 DIAGNOSIS — Z809 Family history of malignant neoplasm, unspecified: Secondary | ICD-10-CM | POA: Diagnosis not present

## 2019-08-13 DIAGNOSIS — R7989 Other specified abnormal findings of blood chemistry: Secondary | ICD-10-CM | POA: Diagnosis not present

## 2019-08-13 DIAGNOSIS — Z8249 Family history of ischemic heart disease and other diseases of the circulatory system: Secondary | ICD-10-CM | POA: Diagnosis not present

## 2019-08-17 DIAGNOSIS — Z299 Encounter for prophylactic measures, unspecified: Secondary | ICD-10-CM | POA: Diagnosis not present

## 2019-08-17 DIAGNOSIS — I5022 Chronic systolic (congestive) heart failure: Secondary | ICD-10-CM | POA: Diagnosis not present

## 2019-08-17 DIAGNOSIS — I83009 Varicose veins of unspecified lower extremity with ulcer of unspecified site: Secondary | ICD-10-CM | POA: Diagnosis not present

## 2019-08-17 DIAGNOSIS — I4891 Unspecified atrial fibrillation: Secondary | ICD-10-CM | POA: Diagnosis not present

## 2019-08-17 DIAGNOSIS — N184 Chronic kidney disease, stage 4 (severe): Secondary | ICD-10-CM | POA: Diagnosis not present

## 2019-08-17 DIAGNOSIS — E1165 Type 2 diabetes mellitus with hyperglycemia: Secondary | ICD-10-CM | POA: Diagnosis not present

## 2019-08-17 DIAGNOSIS — I10 Essential (primary) hypertension: Secondary | ICD-10-CM | POA: Diagnosis not present

## 2019-08-17 DIAGNOSIS — Z6835 Body mass index (BMI) 35.0-35.9, adult: Secondary | ICD-10-CM | POA: Diagnosis not present

## 2019-08-24 ENCOUNTER — Ambulatory Visit (INDEPENDENT_AMBULATORY_CARE_PROVIDER_SITE_OTHER): Payer: Medicare Other | Admitting: Cardiology

## 2019-08-24 ENCOUNTER — Encounter: Payer: Self-pay | Admitting: *Deleted

## 2019-08-24 ENCOUNTER — Encounter: Payer: Self-pay | Admitting: Cardiology

## 2019-08-24 ENCOUNTER — Other Ambulatory Visit: Payer: Self-pay

## 2019-08-24 VITALS — BP 134/88 | HR 63 | Ht 59.0 in | Wt 170.0 lb

## 2019-08-24 DIAGNOSIS — I480 Paroxysmal atrial fibrillation: Secondary | ICD-10-CM | POA: Diagnosis not present

## 2019-08-24 DIAGNOSIS — I214 Non-ST elevation (NSTEMI) myocardial infarction: Secondary | ICD-10-CM | POA: Diagnosis not present

## 2019-08-24 DIAGNOSIS — I25119 Atherosclerotic heart disease of native coronary artery with unspecified angina pectoris: Secondary | ICD-10-CM | POA: Diagnosis not present

## 2019-08-24 NOTE — Progress Notes (Signed)
Cardiology Office Note  Date: 08/24/2019   ID: BRENEE GAJDA, DOB 12/31/1945, MRN [id number]  PCP:  Glenda Chroman, MD  Cardiologist:  Rozann Lesches, MD Electrophysiologist:  None   Chief Complaint  Patient presents with  . Hospitalization Follow-up    History of Present Illness: Melinda Vang is a 74 y.o. female that I have not seen in the office since November 2018.  I reviewed extensive interval records and updated the chart.  She had a recent hospitalization at [facility] in early May. She initially presented to [facility] with chest discomfort and abnormal troponin T levels in the setting of rapid atrial fibrillation which resulted in transfer to [facility] (not certain if [facility] was contacted or not).  Discharge summary indicates suspected demand ischemia in the setting of rapid atrial fibrillation (which spontaneously converted to sinus rhythm), she was started on Multaq and continued on metoprolol.  CHA2DS2-VASc score is 6, but she was not started on anticoagulation during her hospitalization (decision was deferred by cardiologist Dr. Quillian Quince).  Echocardiogram performed during her stay revealed LVEF 60 to 65% with increased gradient across the bioprosthetic AVR suggesting at least moderate prosthetic stenosis (mean gradient 35 mmHg with dimensionless index of 0.23).  She is here today with her daughter.  We discussed the recent hospital evaluation in [facility].  Ms. Iribe remains in sinus rhythm today by follow-up ECG.  I had a long discussion with the patient and her daughter regarding risk of stroke with paroxysmal atrial fibrillation and indication for anticoagulation.  As noted previously she was not able to tolerate amiodarone due to severe nausea, patient's daughter indicates that they did not get Multaq filled due to extremely high cost, we will check on prior authorization.  Technically speaking Coumadin would be indicated in light of her prosthetic  aortic valve, although we are using DOAC's more commonly now and per the patient's daughter it would be much easier if she did not have to come in frequently for PT/INR.  She reportedly had follow-up lab work with her PCP recently which we are requesting.  I also discussed the issues with her prosthetic stenosis.  This is a difficult situation, she is not a good candidate for an open redo valve operation.  Although valve in valve TAVR is done, I am not certain whether this would be an option or not without seeking further consultation with my colleagues.  Past Medical History:  Diagnosis Date  . Anxiety   . Developmental delay   . Essential hypertension   . GERD (gastroesophageal reflux disease)   . Gout   . History of anemia   . Peripheral vascular disease ([place])   . Postoperative atrial fibrillation (HCC)    Initially on amiodarone but discontinued due to severe nausea.  . Rectal cancer ([place])   . S/P AVR (aortic valve replacement) 07/05/2011   19 mm [facility] Ease pericardial tissue valve with septal myomectomy  . 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 Status post myomectomy 07/05/2011  . Type 2 diabetes mellitus ([place])     Past Surgical History:  Procedure Laterality Date  . ABDOMINAL HYSTERECTOMY    . AORTIC VALVE REPLACEMENT  07/04/2011   Procedure: AORTIC VALVE REPLACEMENT (AVR);  Surgeon: Rexene Alberts, MD;  Location: [place];  Service: Open Heart Surgery;  Laterality: N/A;  . COLONOSCOPY N/A 09/14/2013   Procedure: COLONOSCOPY;  Surgeon: Rogene Houston, MD;  [facility];  Service: Endoscopy;  Laterality: N/A;  830  . COLONOSCOPY W/ BIOPSIES AND POLYPECTOMY    . CORONARY ARTERY BYPASS GRAFT  07/04/2011   Procedure: CORONARY ARTERY BYPASS GRAFTING (CABG);  Surgeon: Rexene Alberts, MD;  Location: [place];  Service: Open Heart Surgery;  Laterality: N/A;  four grafts using bilateral leg greater saphenous vein harvested endoscopically.  Marland Kitchen  LEFT HEART CATHETERIZATION WITH CORONARY ANGIOGRAM N/A 07/04/2011   Procedure: LEFT HEART CATHETERIZATION WITH CORONARY ANGIOGRAM;  Surgeon: [name], MD;  Location: [facility] CATH LAB;  Service: Cardiovascular;  Laterality: N/A;  . LOW ANTERIOR BOWEL RESECTION  2011   rectal cancer  . MYOMECTOMY  07/04/2011   Procedure: MYOMECTOMY;  Surgeon: Rexene Alberts, MD;  Location: [place];  Service: Open Heart Surgery;  Laterality: N/A;  septal    Current Outpatient Medications  Medication Sig Dispense Refill  . aspirin EC 81 MG tablet Take 81 mg by mouth daily.    Marland Kitchen atorvastatin (LIPITOR) 20 MG tablet Take 1 tablet (20 mg total) by mouth daily at 6 PM. 30 tablet 1  . furosemide (LASIX) 20 MG tablet Take 20 mg by mouth. 5 days weekly    . glipiZIDE (GLUCOTROL XL) 10 MG 24 hr tablet Take 10 mg by mouth 2 (two) times daily.    Marland Kitchen levothyroxine (SYNTHROID, LEVOTHROID) 25 MCG tablet Take 1 tablet by mouth Daily.    . metoprolol tartrate (LOPRESSOR) 25 MG tablet Take 1 tablet (25 mg total) by mouth 2 (two) times daily. 60 tablet 1  . potassium chloride (K-DUR,KLOR-CON) 10 MEQ tablet Take 10 mEq by mouth daily.    . vitamin C (ASCORBIC ACID) 500 MG tablet Take 500 mg by mouth daily.    . Vitamin D, Ergocalciferol, (DRISDOL) 50000 UNITS CAPS Take 50,000 Units by mouth every 7 (seven) days. mondays     No current facility-administered medications for this visit.   Allergies:  Amiodarone   Social History: The patient  reports that she quit smoking about 30 years ago. Her smoking use included cigarettes. She has a 20.00 pack-year smoking history. She has never used smokeless tobacco. She reports that she does not drink alcohol or use drugs.   Family History: The patient's family history includes Diabetes in her sister; Heart attack in her father; Heart failure in her mother; Hypertension in her sister; Stroke in her mother.   ROS:   No recent palpitations or chest pain.  Dyspnea on exertion, uses a  walker.  Physical Exam: VS:  BP 134/88   Pulse 63   Ht 4\' 11"  (1.499 m)   Wt 170 lb (77.1 kg)   SpO2 93%   BMI 34.34 kg/m , BMI Body mass index is 34.34 kg/m.  Wt Readings from Last 3 Encounters:  08/24/19 170 lb (77.1 kg)  02/27/17 182 lb 12.8 oz (82.9 kg)  08/11/16 176 lb 3.2 oz (79.9 kg)    General: Elderly woman, using walker. HEENT: Conjunctiva and lids normal, wearing a mask. Neck: Supple, no elevated JVP or carotid bruits, no thyromegaly. Lungs: Decreased breath sounds without wheezing, nonlabored breathing at rest. Cardiac: Regular rate and rhythm, no S3, 3/6 systolic murmur, no pericardial rub. Abdomen: Soft, bowel sounds present, no guarding or rebound. Extremities: Mild ankle edema, distal pulses 2+. Skin: Warm and dry. Musculoskeletal: No kyphosis.  ECG:  An ECG dated 04/23/2016 was personally reviewed today and demonstrated:  Sinus rhythm with IVCD, left anterior  fascicular block.  Recent Labwork:  May 2021: Potassium 4.8, BUN 36, creatinine 2.16, hemoglobin 10.8, platelets 155, troponin I 958  Other Studies Reviewed Today:  Echocardiogram 08/13/2019 ([facility]): SUMMARY  Left ventricular systolic function is normal.  LV ejection fraction = 60-65%.  Unable to fully assess LV regional wall motion  Left ventricular filling pattern is pseudonormal.  The right ventricular systolic function is normal.  There is a bioprosthetic aortic valve.  There is moderate to severe aortic stenosis.  There is no aortic regurgitation.  There is trace tricuspid regurgitation.  Moderate pulmonary hypertension.  Estimated right ventricular systolic pressure is 58-85 mmHg.  IVC size was mildly dilated.  Assessment and Plan:  1.  Patient status post recent episode of atrial fibrillation with RVR.  CHA2DS2-VASc score is 6.  Requesting recent lab work from Dr. Woody Seller.  Plan to pursue anticoagulation instead of aspirin for more effective stroke risk reduction, will decide on  Coumadin versus DOAC.  Also check on prior authorization for Multaq as they cannot afford the medication now.  She has a prior intolerance to amiodarone given severe nausea.  Continue Lopressor.  2.  Prosthetic aortic stenosis, moderate to severe.  She has a bioprosthesis in place.  Recent echocardiogram showed mean gradient of 35 mmHg with dimensionless index of 0.23.  This is a difficult situation as she is not a good candidate for open redo valve operation.  Whether she would be a candidate for valve in valve TAVR is not clear.  We will plan a follow-up echocardiogram within 6 months.  3.  CAD status post CABG.  Recent troponin levels are consistent with NSTEMI, likely type II event in the setting of rapid atrial fibrillation.  She does not report any chest pain at this time.  For now she remains on aspirin and statin along with beta-blocker.  Medication Adjustments/Labs and Tests Ordered: Current medicines are reviewed at length with the patient today.  Concerns regarding medicines are outlined above.   Tests Ordered: Orders Placed This Encounter  Procedures  . EKG 12-Lead    Medication Changes: No orders of the defined types were placed in this encounter.   Disposition:  Follow up lab work and determine next step.  Signed, Satira Sark, MD, [facility] 08/24/2019 4:14 PM    [facility] at [place], [place], [place] [address] Phone: [phone]; Fax: [phone]

## 2019-08-24 NOTE — Patient Instructions (Addendum)
Your physician recommends that you schedule a follow-up appointment in: PENDING WITH DR Viera West  Your physician recommends that you continue on your current medications as directed. Please refer to the Current Medication list given to you today.  WE WILL CONTACT YOU ABOUT MEDICATIONS   Thank you for choosing [facility]!!

## 2019-08-25 ENCOUNTER — Encounter: Payer: Self-pay | Admitting: *Deleted

## 2019-08-25 ENCOUNTER — Telehealth: Payer: Self-pay | Admitting: *Deleted

## 2019-08-25 MED ORDER — MULTAQ 400 MG PO TABS: 400.0000 mg | ORAL_TABLET | Freq: Two times a day (BID) | ORAL | 0 refills | Status: DC

## 2019-08-25 NOTE — Telephone Encounter (Signed)
Prior Auth done through covermymeds today and has been approved: [id number];LAGTXM:[id number];Review Type:Prior Auth;Coverage Start Date:07/26/2019;Coverage End Date:08/24/2020;  Information faxed to [facility].

## 2019-08-25 NOTE — Telephone Encounter (Signed)
Contacted [facility] to start prior auth for multaq. Multaq 400 mg by mouth twice daily called in.

## 2019-09-02 ENCOUNTER — Telehealth: Payer: Self-pay | Admitting: *Deleted

## 2019-09-02 NOTE — Telephone Encounter (Signed)
-----   Message from Satira Sark, MD sent at 08/31/2019  8:26 AM EDT ----- Results reviewed.  Recent lab work from PCP shows creatinine 2.08.  Please check with them regarding whether they have discussed further preference for Coumadin with follow-up in the anticoagulation clinic versus Eliquis which we would start at 5 mg twice daily.  Cost of prescription coverage may have some bearing on this decision.

## 2019-09-06 NOTE — Telephone Encounter (Signed)
Patient's sister Melinda Vang informed and wants to discuss this with Dr. Woody Seller before starting anticoagulation therapy. Copy sent to PCP

## 2019-09-14 DIAGNOSIS — I10 Essential (primary) hypertension: Secondary | ICD-10-CM | POA: Diagnosis not present

## 2019-09-14 DIAGNOSIS — E7800 Pure hypercholesterolemia, unspecified: Secondary | ICD-10-CM | POA: Diagnosis not present

## 2019-09-14 DIAGNOSIS — Z1339 Encounter for screening examination for other mental health and behavioral disorders: Secondary | ICD-10-CM | POA: Diagnosis not present

## 2019-09-14 DIAGNOSIS — E039 Hypothyroidism, unspecified: Secondary | ICD-10-CM | POA: Diagnosis not present

## 2019-09-14 DIAGNOSIS — Z7189 Other specified counseling: Secondary | ICD-10-CM | POA: Diagnosis not present

## 2019-09-14 DIAGNOSIS — Z299 Encounter for prophylactic measures, unspecified: Secondary | ICD-10-CM | POA: Diagnosis not present

## 2019-09-14 DIAGNOSIS — Z1211 Encounter for screening for malignant neoplasm of colon: Secondary | ICD-10-CM | POA: Diagnosis not present

## 2019-09-14 DIAGNOSIS — R5383 Other fatigue: Secondary | ICD-10-CM | POA: Diagnosis not present

## 2019-09-14 DIAGNOSIS — Z6841 Body Mass Index (BMI) 40.0 and over, adult: Secondary | ICD-10-CM | POA: Diagnosis not present

## 2019-09-14 DIAGNOSIS — Z1331 Encounter for screening for depression: Secondary | ICD-10-CM | POA: Diagnosis not present

## 2019-09-14 DIAGNOSIS — Z0000 Encounter for general adult medical examination without abnormal findings: Secondary | ICD-10-CM | POA: Diagnosis not present

## 2019-09-14 DIAGNOSIS — Z79899 Other long term (current) drug therapy: Secondary | ICD-10-CM | POA: Diagnosis not present

## 2019-09-15 ENCOUNTER — Telehealth: Payer: Self-pay | Admitting: Cardiology

## 2019-09-15 NOTE — Telephone Encounter (Signed)
Spoke with Marcie Bal who states that the pt is having nausea, weakness, diarrhea, and SOB that started 2 weeks ago. Marcie Bal states that these are that same side effects listed online for Multaq. Pt was seen by Dr.Vyas on Tuesday. Please advise.

## 2019-09-15 NOTE — Telephone Encounter (Signed)
She could hold the Multaq to see if symptoms improve, but we have very limited options left to manage her atrial fibrillation. There is no lower dose of Multaq.

## 2019-09-15 NOTE — Telephone Encounter (Signed)
Marcie Bal called stating that Dr. Woody Seller suggested  Eliquis for patient's anticoagulation therapy States that she saw Dr. Woody Seller on 09/14/2019 in regards to her being very weak wanting to know if the Multaq is to strong.  Please call Marcie Bal at [phone] ext [phone] or after 500pm call [phone].

## 2019-09-16 NOTE — Telephone Encounter (Signed)
I spoke with Marcie Bal, she will speak with patient and advise her to hold multaq and see if sx's resolve

## 2019-09-20 DIAGNOSIS — E875 Hyperkalemia: Secondary | ICD-10-CM | POA: Diagnosis not present

## 2019-11-17 DIAGNOSIS — N184 Chronic kidney disease, stage 4 (severe): Secondary | ICD-10-CM | POA: Diagnosis not present

## 2019-11-17 DIAGNOSIS — E1165 Type 2 diabetes mellitus with hyperglycemia: Secondary | ICD-10-CM | POA: Diagnosis not present

## 2019-11-17 DIAGNOSIS — E1122 Type 2 diabetes mellitus with diabetic chronic kidney disease: Secondary | ICD-10-CM | POA: Diagnosis not present

## 2019-11-17 DIAGNOSIS — Z6834 Body mass index (BMI) 34.0-34.9, adult: Secondary | ICD-10-CM | POA: Diagnosis not present

## 2019-11-17 DIAGNOSIS — I4891 Unspecified atrial fibrillation: Secondary | ICD-10-CM | POA: Diagnosis not present

## 2019-11-17 DIAGNOSIS — I10 Essential (primary) hypertension: Secondary | ICD-10-CM | POA: Diagnosis not present

## 2019-11-17 DIAGNOSIS — Z299 Encounter for prophylactic measures, unspecified: Secondary | ICD-10-CM | POA: Diagnosis not present

## 2019-12-16 DIAGNOSIS — Z299 Encounter for prophylactic measures, unspecified: Secondary | ICD-10-CM | POA: Diagnosis not present

## 2019-12-16 DIAGNOSIS — M109 Gout, unspecified: Secondary | ICD-10-CM | POA: Diagnosis not present

## 2019-12-16 DIAGNOSIS — M79671 Pain in right foot: Secondary | ICD-10-CM | POA: Diagnosis not present

## 2019-12-16 DIAGNOSIS — R5383 Other fatigue: Secondary | ICD-10-CM | POA: Diagnosis not present

## 2019-12-16 DIAGNOSIS — I4891 Unspecified atrial fibrillation: Secondary | ICD-10-CM | POA: Diagnosis not present

## 2020-03-13 DIAGNOSIS — E1122 Type 2 diabetes mellitus with diabetic chronic kidney disease: Secondary | ICD-10-CM | POA: Diagnosis not present

## 2020-03-13 DIAGNOSIS — I10 Essential (primary) hypertension: Secondary | ICD-10-CM | POA: Diagnosis not present

## 2020-03-13 DIAGNOSIS — Z299 Encounter for prophylactic measures, unspecified: Secondary | ICD-10-CM | POA: Diagnosis not present

## 2020-03-13 DIAGNOSIS — Z23 Encounter for immunization: Secondary | ICD-10-CM | POA: Diagnosis not present

## 2020-03-13 DIAGNOSIS — I5022 Chronic systolic (congestive) heart failure: Secondary | ICD-10-CM | POA: Diagnosis not present

## 2020-03-13 DIAGNOSIS — N184 Chronic kidney disease, stage 4 (severe): Secondary | ICD-10-CM | POA: Diagnosis not present

## 2020-03-13 DIAGNOSIS — E1165 Type 2 diabetes mellitus with hyperglycemia: Secondary | ICD-10-CM | POA: Diagnosis not present

## 2020-05-17 DIAGNOSIS — U071 COVID-19: Secondary | ICD-10-CM | POA: Diagnosis not present

## 2020-05-17 DIAGNOSIS — Z299 Encounter for prophylactic measures, unspecified: Secondary | ICD-10-CM | POA: Diagnosis not present

## 2020-07-04 DIAGNOSIS — N184 Chronic kidney disease, stage 4 (severe): Secondary | ICD-10-CM | POA: Diagnosis not present

## 2020-07-04 DIAGNOSIS — E1165 Type 2 diabetes mellitus with hyperglycemia: Secondary | ICD-10-CM | POA: Diagnosis not present

## 2020-07-04 DIAGNOSIS — E1122 Type 2 diabetes mellitus with diabetic chronic kidney disease: Secondary | ICD-10-CM | POA: Diagnosis not present

## 2020-07-04 DIAGNOSIS — I10 Essential (primary) hypertension: Secondary | ICD-10-CM | POA: Diagnosis not present

## 2020-07-04 DIAGNOSIS — I4891 Unspecified atrial fibrillation: Secondary | ICD-10-CM | POA: Diagnosis not present

## 2020-07-04 DIAGNOSIS — Z299 Encounter for prophylactic measures, unspecified: Secondary | ICD-10-CM | POA: Diagnosis not present

## 2020-08-10 DIAGNOSIS — L97911 Non-pressure chronic ulcer of unspecified part of right lower leg limited to breakdown of skin: Secondary | ICD-10-CM | POA: Diagnosis not present

## 2020-08-10 DIAGNOSIS — I10 Essential (primary) hypertension: Secondary | ICD-10-CM | POA: Diagnosis not present

## 2020-08-10 DIAGNOSIS — I5022 Chronic systolic (congestive) heart failure: Secondary | ICD-10-CM | POA: Diagnosis not present

## 2020-08-10 DIAGNOSIS — N184 Chronic kidney disease, stage 4 (severe): Secondary | ICD-10-CM | POA: Diagnosis not present

## 2020-08-10 DIAGNOSIS — Z299 Encounter for prophylactic measures, unspecified: Secondary | ICD-10-CM | POA: Diagnosis not present

## 2020-08-13 DIAGNOSIS — L0390 Cellulitis, unspecified: Secondary | ICD-10-CM | POA: Diagnosis not present

## 2020-08-13 DIAGNOSIS — Z299 Encounter for prophylactic measures, unspecified: Secondary | ICD-10-CM | POA: Diagnosis not present

## 2020-08-13 DIAGNOSIS — I272 Pulmonary hypertension, unspecified: Secondary | ICD-10-CM | POA: Diagnosis not present

## 2020-08-13 DIAGNOSIS — I5022 Chronic systolic (congestive) heart failure: Secondary | ICD-10-CM | POA: Diagnosis not present

## 2020-08-13 DIAGNOSIS — E1165 Type 2 diabetes mellitus with hyperglycemia: Secondary | ICD-10-CM | POA: Diagnosis not present

## 2020-08-13 DIAGNOSIS — I10 Essential (primary) hypertension: Secondary | ICD-10-CM | POA: Diagnosis not present

## 2020-08-14 DIAGNOSIS — I4891 Unspecified atrial fibrillation: Secondary | ICD-10-CM | POA: Diagnosis not present

## 2020-08-14 DIAGNOSIS — Z4800 Encounter for change or removal of nonsurgical wound dressing: Secondary | ICD-10-CM | POA: Diagnosis not present

## 2020-08-14 DIAGNOSIS — I5022 Chronic systolic (congestive) heart failure: Secondary | ICD-10-CM | POA: Diagnosis not present

## 2020-08-14 DIAGNOSIS — I10 Essential (primary) hypertension: Secondary | ICD-10-CM | POA: Diagnosis not present

## 2020-08-14 DIAGNOSIS — S81801D Unspecified open wound, right lower leg, subsequent encounter: Secondary | ICD-10-CM | POA: Diagnosis not present

## 2020-08-14 DIAGNOSIS — S81802D Unspecified open wound, left lower leg, subsequent encounter: Secondary | ICD-10-CM | POA: Diagnosis not present

## 2020-08-14 DIAGNOSIS — E559 Vitamin D deficiency, unspecified: Secondary | ICD-10-CM | POA: Diagnosis not present

## 2020-08-14 DIAGNOSIS — L03115 Cellulitis of right lower limb: Secondary | ICD-10-CM | POA: Diagnosis not present

## 2020-08-14 DIAGNOSIS — Z7982 Long term (current) use of aspirin: Secondary | ICD-10-CM | POA: Diagnosis not present

## 2020-08-14 DIAGNOSIS — E1165 Type 2 diabetes mellitus with hyperglycemia: Secondary | ICD-10-CM | POA: Diagnosis not present

## 2020-08-14 DIAGNOSIS — E2839 Other primary ovarian failure: Secondary | ICD-10-CM | POA: Diagnosis not present

## 2020-08-14 DIAGNOSIS — M810 Age-related osteoporosis without current pathological fracture: Secondary | ICD-10-CM | POA: Diagnosis not present

## 2020-08-14 DIAGNOSIS — F79 Unspecified intellectual disabilities: Secondary | ICD-10-CM | POA: Diagnosis not present

## 2020-08-14 DIAGNOSIS — E039 Hypothyroidism, unspecified: Secondary | ICD-10-CM | POA: Diagnosis not present

## 2020-08-14 DIAGNOSIS — I8390 Asymptomatic varicose veins of unspecified lower extremity: Secondary | ICD-10-CM | POA: Diagnosis not present

## 2020-08-14 DIAGNOSIS — M1990 Unspecified osteoarthritis, unspecified site: Secondary | ICD-10-CM | POA: Diagnosis not present

## 2020-08-14 DIAGNOSIS — N184 Chronic kidney disease, stage 4 (severe): Secondary | ICD-10-CM | POA: Diagnosis not present

## 2020-08-14 DIAGNOSIS — I130 Hypertensive heart and chronic kidney disease with heart failure and stage 1 through stage 4 chronic kidney disease, or unspecified chronic kidney disease: Secondary | ICD-10-CM | POA: Diagnosis not present

## 2020-08-14 DIAGNOSIS — I2510 Atherosclerotic heart disease of native coronary artery without angina pectoris: Secondary | ICD-10-CM | POA: Diagnosis not present

## 2020-08-14 DIAGNOSIS — L03119 Cellulitis of unspecified part of limb: Secondary | ICD-10-CM | POA: Diagnosis not present

## 2020-08-14 DIAGNOSIS — Z79899 Other long term (current) drug therapy: Secondary | ICD-10-CM | POA: Diagnosis not present

## 2020-08-14 DIAGNOSIS — I272 Pulmonary hypertension, unspecified: Secondary | ICD-10-CM | POA: Diagnosis not present

## 2020-08-14 DIAGNOSIS — M1030 Gout due to renal impairment, unspecified site: Secondary | ICD-10-CM | POA: Diagnosis not present

## 2020-08-14 DIAGNOSIS — E7800 Pure hypercholesterolemia, unspecified: Secondary | ICD-10-CM | POA: Diagnosis not present

## 2020-08-14 DIAGNOSIS — E1122 Type 2 diabetes mellitus with diabetic chronic kidney disease: Secondary | ICD-10-CM | POA: Diagnosis not present

## 2020-08-14 DIAGNOSIS — Z299 Encounter for prophylactic measures, unspecified: Secondary | ICD-10-CM | POA: Diagnosis not present

## 2020-08-14 DIAGNOSIS — Z7984 Long term (current) use of oral hypoglycemic drugs: Secondary | ICD-10-CM | POA: Diagnosis not present

## 2020-08-14 DIAGNOSIS — L03116 Cellulitis of left lower limb: Secondary | ICD-10-CM | POA: Diagnosis not present

## 2020-08-17 DIAGNOSIS — I130 Hypertensive heart and chronic kidney disease with heart failure and stage 1 through stage 4 chronic kidney disease, or unspecified chronic kidney disease: Secondary | ICD-10-CM | POA: Diagnosis not present

## 2020-08-17 DIAGNOSIS — S81801D Unspecified open wound, right lower leg, subsequent encounter: Secondary | ICD-10-CM | POA: Diagnosis not present

## 2020-08-17 DIAGNOSIS — S81802D Unspecified open wound, left lower leg, subsequent encounter: Secondary | ICD-10-CM | POA: Diagnosis not present

## 2020-08-17 DIAGNOSIS — E1165 Type 2 diabetes mellitus with hyperglycemia: Secondary | ICD-10-CM | POA: Diagnosis not present

## 2020-08-17 DIAGNOSIS — L03115 Cellulitis of right lower limb: Secondary | ICD-10-CM | POA: Diagnosis not present

## 2020-08-17 DIAGNOSIS — L03116 Cellulitis of left lower limb: Secondary | ICD-10-CM | POA: Diagnosis not present

## 2020-08-21 DIAGNOSIS — I130 Hypertensive heart and chronic kidney disease with heart failure and stage 1 through stage 4 chronic kidney disease, or unspecified chronic kidney disease: Secondary | ICD-10-CM | POA: Diagnosis not present

## 2020-08-21 DIAGNOSIS — S81802D Unspecified open wound, left lower leg, subsequent encounter: Secondary | ICD-10-CM | POA: Diagnosis not present

## 2020-08-21 DIAGNOSIS — E1165 Type 2 diabetes mellitus with hyperglycemia: Secondary | ICD-10-CM | POA: Diagnosis not present

## 2020-08-21 DIAGNOSIS — L03116 Cellulitis of left lower limb: Secondary | ICD-10-CM | POA: Diagnosis not present

## 2020-08-21 DIAGNOSIS — S81801D Unspecified open wound, right lower leg, subsequent encounter: Secondary | ICD-10-CM | POA: Diagnosis not present

## 2020-08-21 DIAGNOSIS — L03115 Cellulitis of right lower limb: Secondary | ICD-10-CM | POA: Diagnosis not present

## 2020-08-24 DIAGNOSIS — S81801D Unspecified open wound, right lower leg, subsequent encounter: Secondary | ICD-10-CM | POA: Diagnosis not present

## 2020-08-24 DIAGNOSIS — E1165 Type 2 diabetes mellitus with hyperglycemia: Secondary | ICD-10-CM | POA: Diagnosis not present

## 2020-08-24 DIAGNOSIS — L03115 Cellulitis of right lower limb: Secondary | ICD-10-CM | POA: Diagnosis not present

## 2020-08-24 DIAGNOSIS — L03116 Cellulitis of left lower limb: Secondary | ICD-10-CM | POA: Diagnosis not present

## 2020-08-24 DIAGNOSIS — I130 Hypertensive heart and chronic kidney disease with heart failure and stage 1 through stage 4 chronic kidney disease, or unspecified chronic kidney disease: Secondary | ICD-10-CM | POA: Diagnosis not present

## 2020-08-24 DIAGNOSIS — S81802D Unspecified open wound, left lower leg, subsequent encounter: Secondary | ICD-10-CM | POA: Diagnosis not present

## 2020-08-27 DIAGNOSIS — E1165 Type 2 diabetes mellitus with hyperglycemia: Secondary | ICD-10-CM | POA: Diagnosis not present

## 2020-08-27 DIAGNOSIS — S81802D Unspecified open wound, left lower leg, subsequent encounter: Secondary | ICD-10-CM | POA: Diagnosis not present

## 2020-08-27 DIAGNOSIS — I130 Hypertensive heart and chronic kidney disease with heart failure and stage 1 through stage 4 chronic kidney disease, or unspecified chronic kidney disease: Secondary | ICD-10-CM | POA: Diagnosis not present

## 2020-08-27 DIAGNOSIS — L03115 Cellulitis of right lower limb: Secondary | ICD-10-CM | POA: Diagnosis not present

## 2020-08-27 DIAGNOSIS — L03116 Cellulitis of left lower limb: Secondary | ICD-10-CM | POA: Diagnosis not present

## 2020-08-27 DIAGNOSIS — S81801D Unspecified open wound, right lower leg, subsequent encounter: Secondary | ICD-10-CM | POA: Diagnosis not present

## 2020-08-29 DIAGNOSIS — E1165 Type 2 diabetes mellitus with hyperglycemia: Secondary | ICD-10-CM | POA: Diagnosis not present

## 2020-08-29 DIAGNOSIS — I130 Hypertensive heart and chronic kidney disease with heart failure and stage 1 through stage 4 chronic kidney disease, or unspecified chronic kidney disease: Secondary | ICD-10-CM | POA: Diagnosis not present

## 2020-08-29 DIAGNOSIS — L03115 Cellulitis of right lower limb: Secondary | ICD-10-CM | POA: Diagnosis not present

## 2020-08-29 DIAGNOSIS — S81802D Unspecified open wound, left lower leg, subsequent encounter: Secondary | ICD-10-CM | POA: Diagnosis not present

## 2020-08-29 DIAGNOSIS — S81801D Unspecified open wound, right lower leg, subsequent encounter: Secondary | ICD-10-CM | POA: Diagnosis not present

## 2020-08-29 DIAGNOSIS — L03116 Cellulitis of left lower limb: Secondary | ICD-10-CM | POA: Diagnosis not present

## 2020-08-29 DIAGNOSIS — N179 Acute kidney failure, unspecified: Secondary | ICD-10-CM | POA: Diagnosis not present

## 2020-09-04 DIAGNOSIS — L03115 Cellulitis of right lower limb: Secondary | ICD-10-CM | POA: Diagnosis not present

## 2020-09-04 DIAGNOSIS — S81802D Unspecified open wound, left lower leg, subsequent encounter: Secondary | ICD-10-CM | POA: Diagnosis not present

## 2020-09-04 DIAGNOSIS — S81801D Unspecified open wound, right lower leg, subsequent encounter: Secondary | ICD-10-CM | POA: Diagnosis not present

## 2020-09-04 DIAGNOSIS — I130 Hypertensive heart and chronic kidney disease with heart failure and stage 1 through stage 4 chronic kidney disease, or unspecified chronic kidney disease: Secondary | ICD-10-CM | POA: Diagnosis not present

## 2020-09-04 DIAGNOSIS — E1165 Type 2 diabetes mellitus with hyperglycemia: Secondary | ICD-10-CM | POA: Diagnosis not present

## 2020-09-04 DIAGNOSIS — L03116 Cellulitis of left lower limb: Secondary | ICD-10-CM | POA: Diagnosis not present

## 2020-09-05 DIAGNOSIS — S81801D Unspecified open wound, right lower leg, subsequent encounter: Secondary | ICD-10-CM | POA: Diagnosis not present

## 2020-09-06 DIAGNOSIS — S81802D Unspecified open wound, left lower leg, subsequent encounter: Secondary | ICD-10-CM | POA: Diagnosis not present

## 2020-09-06 DIAGNOSIS — E1165 Type 2 diabetes mellitus with hyperglycemia: Secondary | ICD-10-CM | POA: Diagnosis not present

## 2020-09-06 DIAGNOSIS — L03115 Cellulitis of right lower limb: Secondary | ICD-10-CM | POA: Diagnosis not present

## 2020-09-06 DIAGNOSIS — I130 Hypertensive heart and chronic kidney disease with heart failure and stage 1 through stage 4 chronic kidney disease, or unspecified chronic kidney disease: Secondary | ICD-10-CM | POA: Diagnosis not present

## 2020-09-06 DIAGNOSIS — S81801D Unspecified open wound, right lower leg, subsequent encounter: Secondary | ICD-10-CM | POA: Diagnosis not present

## 2020-09-06 DIAGNOSIS — L03116 Cellulitis of left lower limb: Secondary | ICD-10-CM | POA: Diagnosis not present

## 2020-09-10 DIAGNOSIS — L03116 Cellulitis of left lower limb: Secondary | ICD-10-CM | POA: Diagnosis not present

## 2020-09-10 DIAGNOSIS — E1165 Type 2 diabetes mellitus with hyperglycemia: Secondary | ICD-10-CM | POA: Diagnosis not present

## 2020-09-10 DIAGNOSIS — S81802D Unspecified open wound, left lower leg, subsequent encounter: Secondary | ICD-10-CM | POA: Diagnosis not present

## 2020-09-10 DIAGNOSIS — L03115 Cellulitis of right lower limb: Secondary | ICD-10-CM | POA: Diagnosis not present

## 2020-09-10 DIAGNOSIS — I130 Hypertensive heart and chronic kidney disease with heart failure and stage 1 through stage 4 chronic kidney disease, or unspecified chronic kidney disease: Secondary | ICD-10-CM | POA: Diagnosis not present

## 2020-09-10 DIAGNOSIS — S81801D Unspecified open wound, right lower leg, subsequent encounter: Secondary | ICD-10-CM | POA: Diagnosis not present

## 2020-09-13 DIAGNOSIS — I2510 Atherosclerotic heart disease of native coronary artery without angina pectoris: Secondary | ICD-10-CM | POA: Diagnosis not present

## 2020-09-13 DIAGNOSIS — I8390 Asymptomatic varicose veins of unspecified lower extremity: Secondary | ICD-10-CM | POA: Diagnosis not present

## 2020-09-13 DIAGNOSIS — M1030 Gout due to renal impairment, unspecified site: Secondary | ICD-10-CM | POA: Diagnosis not present

## 2020-09-13 DIAGNOSIS — Z4800 Encounter for change or removal of nonsurgical wound dressing: Secondary | ICD-10-CM | POA: Diagnosis not present

## 2020-09-13 DIAGNOSIS — I4891 Unspecified atrial fibrillation: Secondary | ICD-10-CM | POA: Diagnosis not present

## 2020-09-13 DIAGNOSIS — N184 Chronic kidney disease, stage 4 (severe): Secondary | ICD-10-CM | POA: Diagnosis not present

## 2020-09-13 DIAGNOSIS — S81801D Unspecified open wound, right lower leg, subsequent encounter: Secondary | ICD-10-CM | POA: Diagnosis not present

## 2020-09-13 DIAGNOSIS — I5022 Chronic systolic (congestive) heart failure: Secondary | ICD-10-CM | POA: Diagnosis not present

## 2020-09-13 DIAGNOSIS — Z79899 Other long term (current) drug therapy: Secondary | ICD-10-CM | POA: Diagnosis not present

## 2020-09-13 DIAGNOSIS — M810 Age-related osteoporosis without current pathological fracture: Secondary | ICD-10-CM | POA: Diagnosis not present

## 2020-09-13 DIAGNOSIS — L97911 Non-pressure chronic ulcer of unspecified part of right lower leg limited to breakdown of skin: Secondary | ICD-10-CM | POA: Diagnosis not present

## 2020-09-13 DIAGNOSIS — M1990 Unspecified osteoarthritis, unspecified site: Secondary | ICD-10-CM | POA: Diagnosis not present

## 2020-09-13 DIAGNOSIS — F79 Unspecified intellectual disabilities: Secondary | ICD-10-CM | POA: Diagnosis not present

## 2020-09-13 DIAGNOSIS — L03115 Cellulitis of right lower limb: Secondary | ICD-10-CM | POA: Diagnosis not present

## 2020-09-13 DIAGNOSIS — E1165 Type 2 diabetes mellitus with hyperglycemia: Secondary | ICD-10-CM | POA: Diagnosis not present

## 2020-09-13 DIAGNOSIS — I272 Pulmonary hypertension, unspecified: Secondary | ICD-10-CM | POA: Diagnosis not present

## 2020-09-13 DIAGNOSIS — E7800 Pure hypercholesterolemia, unspecified: Secondary | ICD-10-CM | POA: Diagnosis not present

## 2020-09-13 DIAGNOSIS — Z7982 Long term (current) use of aspirin: Secondary | ICD-10-CM | POA: Diagnosis not present

## 2020-09-13 DIAGNOSIS — L97919 Non-pressure chronic ulcer of unspecified part of right lower leg with unspecified severity: Secondary | ICD-10-CM | POA: Diagnosis not present

## 2020-09-13 DIAGNOSIS — S81802D Unspecified open wound, left lower leg, subsequent encounter: Secondary | ICD-10-CM | POA: Diagnosis not present

## 2020-09-13 DIAGNOSIS — L03116 Cellulitis of left lower limb: Secondary | ICD-10-CM | POA: Diagnosis not present

## 2020-09-13 DIAGNOSIS — Z7984 Long term (current) use of oral hypoglycemic drugs: Secondary | ICD-10-CM | POA: Diagnosis not present

## 2020-09-13 DIAGNOSIS — E2839 Other primary ovarian failure: Secondary | ICD-10-CM | POA: Diagnosis not present

## 2020-09-13 DIAGNOSIS — E559 Vitamin D deficiency, unspecified: Secondary | ICD-10-CM | POA: Diagnosis not present

## 2020-09-13 DIAGNOSIS — E1122 Type 2 diabetes mellitus with diabetic chronic kidney disease: Secondary | ICD-10-CM | POA: Diagnosis not present

## 2020-09-13 DIAGNOSIS — I130 Hypertensive heart and chronic kidney disease with heart failure and stage 1 through stage 4 chronic kidney disease, or unspecified chronic kidney disease: Secondary | ICD-10-CM | POA: Diagnosis not present

## 2020-09-13 DIAGNOSIS — E039 Hypothyroidism, unspecified: Secondary | ICD-10-CM | POA: Diagnosis not present

## 2020-09-14 DIAGNOSIS — L03116 Cellulitis of left lower limb: Secondary | ICD-10-CM | POA: Diagnosis not present

## 2020-09-14 DIAGNOSIS — S81802D Unspecified open wound, left lower leg, subsequent encounter: Secondary | ICD-10-CM | POA: Diagnosis not present

## 2020-09-14 DIAGNOSIS — I130 Hypertensive heart and chronic kidney disease with heart failure and stage 1 through stage 4 chronic kidney disease, or unspecified chronic kidney disease: Secondary | ICD-10-CM | POA: Diagnosis not present

## 2020-09-14 DIAGNOSIS — E1165 Type 2 diabetes mellitus with hyperglycemia: Secondary | ICD-10-CM | POA: Diagnosis not present

## 2020-09-14 DIAGNOSIS — S81801D Unspecified open wound, right lower leg, subsequent encounter: Secondary | ICD-10-CM | POA: Diagnosis not present

## 2020-09-14 DIAGNOSIS — L03115 Cellulitis of right lower limb: Secondary | ICD-10-CM | POA: Diagnosis not present

## 2020-09-17 DIAGNOSIS — I482 Chronic atrial fibrillation, unspecified: Secondary | ICD-10-CM | POA: Diagnosis present

## 2020-09-17 DIAGNOSIS — Z6841 Body Mass Index (BMI) 40.0 and over, adult: Secondary | ICD-10-CM | POA: Diagnosis not present

## 2020-09-17 DIAGNOSIS — E162 Hypoglycemia, unspecified: Secondary | ICD-10-CM | POA: Diagnosis not present

## 2020-09-17 DIAGNOSIS — I081 Rheumatic disorders of both mitral and tricuspid valves: Secondary | ICD-10-CM | POA: Diagnosis not present

## 2020-09-17 DIAGNOSIS — E161 Other hypoglycemia: Secondary | ICD-10-CM | POA: Diagnosis not present

## 2020-09-17 DIAGNOSIS — S81802D Unspecified open wound, left lower leg, subsequent encounter: Secondary | ICD-10-CM | POA: Diagnosis not present

## 2020-09-17 DIAGNOSIS — I272 Pulmonary hypertension, unspecified: Secondary | ICD-10-CM | POA: Diagnosis present

## 2020-09-17 DIAGNOSIS — I2510 Atherosclerotic heart disease of native coronary artery without angina pectoris: Secondary | ICD-10-CM | POA: Diagnosis present

## 2020-09-17 DIAGNOSIS — L97921 Non-pressure chronic ulcer of unspecified part of left lower leg limited to breakdown of skin: Secondary | ICD-10-CM | POA: Diagnosis present

## 2020-09-17 DIAGNOSIS — R0902 Hypoxemia: Secondary | ICD-10-CM | POA: Diagnosis not present

## 2020-09-17 DIAGNOSIS — E875 Hyperkalemia: Secondary | ICD-10-CM | POA: Diagnosis not present

## 2020-09-17 DIAGNOSIS — T82857A Stenosis of cardiac prosthetic devices, implants and grafts, initial encounter: Secondary | ICD-10-CM | POA: Diagnosis present

## 2020-09-17 DIAGNOSIS — N179 Acute kidney failure, unspecified: Secondary | ICD-10-CM | POA: Diagnosis not present

## 2020-09-17 DIAGNOSIS — N185 Chronic kidney disease, stage 5: Secondary | ICD-10-CM | POA: Diagnosis present

## 2020-09-17 DIAGNOSIS — E079 Disorder of thyroid, unspecified: Secondary | ICD-10-CM | POA: Diagnosis present

## 2020-09-17 DIAGNOSIS — J9601 Acute respiratory failure with hypoxia: Secondary | ICD-10-CM | POA: Diagnosis not present

## 2020-09-17 DIAGNOSIS — R0600 Dyspnea, unspecified: Secondary | ICD-10-CM | POA: Diagnosis not present

## 2020-09-17 DIAGNOSIS — E1122 Type 2 diabetes mellitus with diabetic chronic kidney disease: Secondary | ICD-10-CM | POA: Diagnosis present

## 2020-09-17 DIAGNOSIS — I890 Lymphedema, not elsewhere classified: Secondary | ICD-10-CM | POA: Diagnosis present

## 2020-09-17 DIAGNOSIS — E11622 Type 2 diabetes mellitus with other skin ulcer: Secondary | ICD-10-CM | POA: Diagnosis present

## 2020-09-17 DIAGNOSIS — I509 Heart failure, unspecified: Secondary | ICD-10-CM | POA: Diagnosis not present

## 2020-09-17 DIAGNOSIS — Z7982 Long term (current) use of aspirin: Secondary | ICD-10-CM | POA: Diagnosis not present

## 2020-09-17 DIAGNOSIS — L03115 Cellulitis of right lower limb: Secondary | ICD-10-CM | POA: Diagnosis present

## 2020-09-17 DIAGNOSIS — Z66 Do not resuscitate: Secondary | ICD-10-CM | POA: Diagnosis present

## 2020-09-17 DIAGNOSIS — Z87891 Personal history of nicotine dependence: Secondary | ICD-10-CM | POA: Diagnosis not present

## 2020-09-17 DIAGNOSIS — N189 Chronic kidney disease, unspecified: Secondary | ICD-10-CM | POA: Diagnosis not present

## 2020-09-17 DIAGNOSIS — L97918 Non-pressure chronic ulcer of unspecified part of right lower leg with other specified severity: Secondary | ICD-10-CM | POA: Diagnosis not present

## 2020-09-17 DIAGNOSIS — L97928 Non-pressure chronic ulcer of unspecified part of left lower leg with other specified severity: Secondary | ICD-10-CM | POA: Diagnosis not present

## 2020-09-17 DIAGNOSIS — I517 Cardiomegaly: Secondary | ICD-10-CM | POA: Diagnosis not present

## 2020-09-17 DIAGNOSIS — L03119 Cellulitis of unspecified part of limb: Secondary | ICD-10-CM | POA: Diagnosis not present

## 2020-09-17 DIAGNOSIS — E872 Acidosis: Secondary | ICD-10-CM | POA: Diagnosis not present

## 2020-09-17 DIAGNOSIS — I130 Hypertensive heart and chronic kidney disease with heart failure and stage 1 through stage 4 chronic kidney disease, or unspecified chronic kidney disease: Secondary | ICD-10-CM | POA: Diagnosis not present

## 2020-09-17 DIAGNOSIS — Z20822 Contact with and (suspected) exposure to covid-19: Secondary | ICD-10-CM | POA: Diagnosis not present

## 2020-09-17 DIAGNOSIS — L97911 Non-pressure chronic ulcer of unspecified part of right lower leg limited to breakdown of skin: Secondary | ICD-10-CM | POA: Diagnosis present

## 2020-09-17 DIAGNOSIS — L03116 Cellulitis of left lower limb: Secondary | ICD-10-CM | POA: Diagnosis present

## 2020-09-17 DIAGNOSIS — J90 Pleural effusion, not elsewhere classified: Secondary | ICD-10-CM | POA: Diagnosis not present

## 2020-09-17 DIAGNOSIS — I350 Nonrheumatic aortic (valve) stenosis: Secondary | ICD-10-CM | POA: Diagnosis not present

## 2020-09-17 DIAGNOSIS — I120 Hypertensive chronic kidney disease with stage 5 chronic kidney disease or end stage renal disease: Secondary | ICD-10-CM | POA: Diagnosis not present

## 2020-09-17 DIAGNOSIS — S81801D Unspecified open wound, right lower leg, subsequent encounter: Secondary | ICD-10-CM | POA: Diagnosis not present

## 2020-09-17 DIAGNOSIS — I5033 Acute on chronic diastolic (congestive) heart failure: Secondary | ICD-10-CM | POA: Diagnosis present

## 2020-09-17 DIAGNOSIS — E1165 Type 2 diabetes mellitus with hyperglycemia: Secondary | ICD-10-CM | POA: Diagnosis not present

## 2020-09-19 ENCOUNTER — Inpatient Hospital Stay (HOSPITAL_COMMUNITY)
Admission: EM | Admit: 2020-09-19 | Discharge: 2020-09-22 | DRG: 291 | Disposition: A | Discharge status: Hospice | Payer: Medicare Other | Source: Other Acute Inpatient Hospital | Attending: Family Medicine | Admitting: Family Medicine

## 2020-09-19 ENCOUNTER — Telehealth: Payer: Self-pay | Admitting: Internal Medicine

## 2020-09-19 DIAGNOSIS — Z79899 Other long term (current) drug therapy: Secondary | ICD-10-CM

## 2020-09-19 DIAGNOSIS — T82857A Stenosis of cardiac prosthetic devices, implants and grafts, initial encounter: Secondary | ICD-10-CM | POA: Diagnosis present

## 2020-09-19 DIAGNOSIS — Z7982 Long term (current) use of aspirin: Secondary | ICD-10-CM

## 2020-09-19 DIAGNOSIS — I350 Nonrheumatic aortic (valve) stenosis: Secondary | ICD-10-CM | POA: Diagnosis present

## 2020-09-19 DIAGNOSIS — L03119 Cellulitis of unspecified part of limb: Secondary | ICD-10-CM | POA: Diagnosis present

## 2020-09-19 DIAGNOSIS — I482 Chronic atrial fibrillation, unspecified: Secondary | ICD-10-CM | POA: Diagnosis present

## 2020-09-19 DIAGNOSIS — J9690 Respiratory failure, unspecified, unspecified whether with hypoxia or hypercapnia: Secondary | ICD-10-CM

## 2020-09-19 DIAGNOSIS — R0902 Hypoxemia: Secondary | ICD-10-CM | POA: Diagnosis not present

## 2020-09-19 DIAGNOSIS — R54 Age-related physical debility: Secondary | ICD-10-CM | POA: Diagnosis present

## 2020-09-19 DIAGNOSIS — C218 Malignant neoplasm of overlapping sites of rectum, anus and anal canal: Secondary | ICD-10-CM | POA: Diagnosis not present

## 2020-09-19 DIAGNOSIS — Z6838 Body mass index (BMI) 38.0-38.9, adult: Secondary | ICD-10-CM | POA: Diagnosis not present

## 2020-09-19 DIAGNOSIS — R682 Dry mouth, unspecified: Secondary | ICD-10-CM | POA: Diagnosis not present

## 2020-09-19 DIAGNOSIS — R0602 Shortness of breath: Secondary | ICD-10-CM | POA: Diagnosis not present

## 2020-09-19 DIAGNOSIS — E1122 Type 2 diabetes mellitus with diabetic chronic kidney disease: Secondary | ICD-10-CM | POA: Diagnosis present

## 2020-09-19 DIAGNOSIS — E11622 Type 2 diabetes mellitus with other skin ulcer: Secondary | ICD-10-CM | POA: Diagnosis not present

## 2020-09-19 DIAGNOSIS — E1151 Type 2 diabetes mellitus with diabetic peripheral angiopathy without gangrene: Secondary | ICD-10-CM | POA: Diagnosis present

## 2020-09-19 DIAGNOSIS — Z20822 Contact with and (suspected) exposure to covid-19: Secondary | ICD-10-CM | POA: Diagnosis present

## 2020-09-19 DIAGNOSIS — L97918 Non-pressure chronic ulcer of unspecified part of right lower leg with other specified severity: Secondary | ICD-10-CM | POA: Diagnosis not present

## 2020-09-19 DIAGNOSIS — I259 Chronic ischemic heart disease, unspecified: Secondary | ICD-10-CM | POA: Diagnosis not present

## 2020-09-19 DIAGNOSIS — Z952 Presence of prosthetic heart valve: Secondary | ICD-10-CM

## 2020-09-19 DIAGNOSIS — E119 Type 2 diabetes mellitus without complications: Secondary | ICD-10-CM | POA: Diagnosis not present

## 2020-09-19 DIAGNOSIS — N179 Acute kidney failure, unspecified: Secondary | ICD-10-CM | POA: Diagnosis present

## 2020-09-19 DIAGNOSIS — I130 Hypertensive heart and chronic kidney disease with heart failure and stage 1 through stage 4 chronic kidney disease, or unspecified chronic kidney disease: Secondary | ICD-10-CM | POA: Diagnosis present

## 2020-09-19 DIAGNOSIS — E039 Hypothyroidism, unspecified: Secondary | ICD-10-CM | POA: Diagnosis present

## 2020-09-19 DIAGNOSIS — I2510 Atherosclerotic heart disease of native coronary artery without angina pectoris: Secondary | ICD-10-CM | POA: Diagnosis present

## 2020-09-19 DIAGNOSIS — R000 Tachycardia, unspecified: Secondary | ICD-10-CM | POA: Diagnosis not present

## 2020-09-19 DIAGNOSIS — Z743 Need for continuous supervision: Secondary | ICD-10-CM | POA: Diagnosis not present

## 2020-09-19 DIAGNOSIS — R6250 Unspecified lack of expected normal physiological development in childhood: Secondary | ICD-10-CM | POA: Diagnosis present

## 2020-09-19 DIAGNOSIS — Y831 Surgical operation with implant of artificial internal device as the cause of abnormal reaction of the patient, or of later complication, without mention of misadventure at the time of the procedure: Secondary | ICD-10-CM | POA: Diagnosis present

## 2020-09-19 DIAGNOSIS — Z8249 Family history of ischemic heart disease and other diseases of the circulatory system: Secondary | ICD-10-CM

## 2020-09-19 DIAGNOSIS — Z951 Presence of aortocoronary bypass graft: Secondary | ICD-10-CM

## 2020-09-19 DIAGNOSIS — I959 Hypotension, unspecified: Secondary | ICD-10-CM | POA: Diagnosis present

## 2020-09-19 DIAGNOSIS — Z85048 Personal history of other malignant neoplasm of rectum, rectosigmoid junction, and anus: Secondary | ICD-10-CM

## 2020-09-19 DIAGNOSIS — Z953 Presence of xenogenic heart valve: Secondary | ICD-10-CM | POA: Diagnosis not present

## 2020-09-19 DIAGNOSIS — I5043 Acute on chronic combined systolic (congestive) and diastolic (congestive) heart failure: Secondary | ICD-10-CM | POA: Diagnosis present

## 2020-09-19 DIAGNOSIS — Z515 Encounter for palliative care: Secondary | ICD-10-CM

## 2020-09-19 DIAGNOSIS — I10 Essential (primary) hypertension: Secondary | ICD-10-CM | POA: Diagnosis not present

## 2020-09-19 DIAGNOSIS — I5041 Acute combined systolic (congestive) and diastolic (congestive) heart failure: Secondary | ICD-10-CM | POA: Diagnosis not present

## 2020-09-19 DIAGNOSIS — D649 Anemia, unspecified: Secondary | ICD-10-CM | POA: Diagnosis present

## 2020-09-19 DIAGNOSIS — H04129 Dry eye syndrome of unspecified lacrimal gland: Secondary | ICD-10-CM | POA: Diagnosis not present

## 2020-09-19 DIAGNOSIS — R0600 Dyspnea, unspecified: Secondary | ICD-10-CM | POA: Diagnosis not present

## 2020-09-19 DIAGNOSIS — K2100 Gastro-esophageal reflux disease with esophagitis, without bleeding: Secondary | ICD-10-CM | POA: Diagnosis not present

## 2020-09-19 DIAGNOSIS — F419 Anxiety disorder, unspecified: Secondary | ICD-10-CM | POA: Diagnosis not present

## 2020-09-19 DIAGNOSIS — I25799 Atherosclerosis of other coronary artery bypass graft(s) with unspecified angina pectoris: Secondary | ICD-10-CM | POA: Diagnosis not present

## 2020-09-19 DIAGNOSIS — Z79890 Hormone replacement therapy (postmenopausal): Secondary | ICD-10-CM

## 2020-09-19 DIAGNOSIS — R140 Abdominal distension (gaseous): Secondary | ICD-10-CM | POA: Diagnosis present

## 2020-09-19 DIAGNOSIS — I509 Heart failure, unspecified: Secondary | ICD-10-CM | POA: Diagnosis not present

## 2020-09-19 DIAGNOSIS — E875 Hyperkalemia: Secondary | ICD-10-CM | POA: Diagnosis present

## 2020-09-19 DIAGNOSIS — I4891 Unspecified atrial fibrillation: Secondary | ICD-10-CM | POA: Diagnosis not present

## 2020-09-19 DIAGNOSIS — Z888 Allergy status to other drugs, medicaments and biological substances status: Secondary | ICD-10-CM

## 2020-09-19 DIAGNOSIS — L97928 Non-pressure chronic ulcer of unspecified part of left lower leg with other specified severity: Secondary | ICD-10-CM | POA: Diagnosis not present

## 2020-09-19 DIAGNOSIS — M109 Gout, unspecified: Secondary | ICD-10-CM | POA: Diagnosis present

## 2020-09-19 DIAGNOSIS — E669 Obesity, unspecified: Secondary | ICD-10-CM | POA: Diagnosis present

## 2020-09-19 DIAGNOSIS — N183 Chronic kidney disease, stage 3 unspecified: Secondary | ICD-10-CM | POA: Diagnosis not present

## 2020-09-19 DIAGNOSIS — N189 Chronic kidney disease, unspecified: Secondary | ICD-10-CM | POA: Diagnosis not present

## 2020-09-19 DIAGNOSIS — N184 Chronic kidney disease, stage 4 (severe): Secondary | ICD-10-CM | POA: Diagnosis present

## 2020-09-19 DIAGNOSIS — Z66 Do not resuscitate: Secondary | ICD-10-CM | POA: Diagnosis present

## 2020-09-19 DIAGNOSIS — J90 Pleural effusion, not elsewhere classified: Secondary | ICD-10-CM | POA: Diagnosis not present

## 2020-09-19 DIAGNOSIS — Z823 Family history of stroke: Secondary | ICD-10-CM

## 2020-09-19 DIAGNOSIS — Z833 Family history of diabetes mellitus: Secondary | ICD-10-CM

## 2020-09-19 DIAGNOSIS — J9601 Acute respiratory failure with hypoxia: Secondary | ICD-10-CM | POA: Diagnosis not present

## 2020-09-19 DIAGNOSIS — I517 Cardiomegaly: Secondary | ICD-10-CM | POA: Diagnosis not present

## 2020-09-19 DIAGNOSIS — I5031 Acute diastolic (congestive) heart failure: Secondary | ICD-10-CM | POA: Diagnosis not present

## 2020-09-19 DIAGNOSIS — K219 Gastro-esophageal reflux disease without esophagitis: Secondary | ICD-10-CM | POA: Diagnosis present

## 2020-09-19 DIAGNOSIS — I5033 Acute on chronic diastolic (congestive) heart failure: Secondary | ICD-10-CM | POA: Diagnosis not present

## 2020-09-19 DIAGNOSIS — I890 Lymphedema, not elsewhere classified: Secondary | ICD-10-CM | POA: Diagnosis not present

## 2020-09-19 DIAGNOSIS — Z7984 Long term (current) use of oral hypoglycemic drugs: Secondary | ICD-10-CM

## 2020-09-19 LAB — GLUCOSE, CAPILLARY: Glucose-Capillary: 81 mg/dL (ref 70–99)

## 2020-09-19 NOTE — Telephone Encounter (Signed)
Possible entry error vs. Need for merging of documentation.  Carlyon Shadow, M.D.

## 2020-09-20 ENCOUNTER — Inpatient Hospital Stay (HOSPITAL_COMMUNITY): Payer: Medicare Other

## 2020-09-20 ENCOUNTER — Other Ambulatory Visit: Payer: Self-pay

## 2020-09-20 ENCOUNTER — Encounter (HOSPITAL_COMMUNITY): Payer: Self-pay | Admitting: Internal Medicine

## 2020-09-20 DIAGNOSIS — I350 Nonrheumatic aortic (valve) stenosis: Secondary | ICD-10-CM

## 2020-09-20 DIAGNOSIS — I509 Heart failure, unspecified: Secondary | ICD-10-CM

## 2020-09-20 DIAGNOSIS — I5031 Acute diastolic (congestive) heart failure: Secondary | ICD-10-CM

## 2020-09-20 DIAGNOSIS — Z952 Presence of prosthetic heart valve: Secondary | ICD-10-CM

## 2020-09-20 DIAGNOSIS — J9690 Respiratory failure, unspecified, unspecified whether with hypoxia or hypercapnia: Secondary | ICD-10-CM

## 2020-09-20 DIAGNOSIS — N179 Acute kidney failure, unspecified: Secondary | ICD-10-CM | POA: Diagnosis present

## 2020-09-20 DIAGNOSIS — I5033 Acute on chronic diastolic (congestive) heart failure: Secondary | ICD-10-CM

## 2020-09-20 DIAGNOSIS — R0602 Shortness of breath: Secondary | ICD-10-CM

## 2020-09-20 LAB — CBC
HCT: 36.8 % (ref 36.0–46.0)
Hemoglobin: 11 g/dL — ABNORMAL LOW (ref 12.0–15.0)
MCH: 27.2 pg (ref 26.0–34.0)
MCHC: 29.9 g/dL — ABNORMAL LOW (ref 30.0–36.0)
MCV: 91.1 fL (ref 80.0–100.0)
Platelets: 189 10*3/uL (ref 150–400)
RBC: 4.04 MIL/uL (ref 3.87–5.11)
RDW: 17.8 % — ABNORMAL HIGH (ref 11.5–15.5)
WBC: 12.6 10*3/uL — ABNORMAL HIGH (ref 4.0–10.5)
nRBC: 0.2 % (ref 0.0–0.2)

## 2020-09-20 LAB — CBC WITH DIFFERENTIAL/PLATELET
Abs Immature Granulocytes: 0.11 10*3/uL — ABNORMAL HIGH (ref 0.00–0.07)
Basophils Absolute: 0 10*3/uL (ref 0.0–0.1)
Basophils Relative: 0 %
Eosinophils Absolute: 0.3 10*3/uL (ref 0.0–0.5)
Eosinophils Relative: 2 %
HCT: 37.5 % (ref 36.0–46.0)
Hemoglobin: 11.3 g/dL — ABNORMAL LOW (ref 12.0–15.0)
Immature Granulocytes: 1 %
Lymphocytes Relative: 6 %
Lymphs Abs: 0.7 10*3/uL (ref 0.7–4.0)
MCH: 27.2 pg (ref 26.0–34.0)
MCHC: 30.1 g/dL (ref 30.0–36.0)
MCV: 90.4 fL (ref 80.0–100.0)
Monocytes Absolute: 1.1 10*3/uL — ABNORMAL HIGH (ref 0.1–1.0)
Monocytes Relative: 9 %
Neutro Abs: 10 10*3/uL — ABNORMAL HIGH (ref 1.7–7.7)
Neutrophils Relative %: 82 %
Platelets: 180 10*3/uL (ref 150–400)
RBC: 4.15 MIL/uL (ref 3.87–5.11)
RDW: 17.5 % — ABNORMAL HIGH (ref 11.5–15.5)
WBC: 12.2 10*3/uL — ABNORMAL HIGH (ref 4.0–10.5)
nRBC: 0 % (ref 0.0–0.2)

## 2020-09-20 LAB — HEPATIC FUNCTION PANEL
ALT: 13 U/L (ref 0–44)
AST: 36 U/L (ref 15–41)
Albumin: 2.4 g/dL — ABNORMAL LOW (ref 3.5–5.0)
Alkaline Phosphatase: 93 U/L (ref 38–126)
Bilirubin, Direct: 0.3 mg/dL — ABNORMAL HIGH (ref 0.0–0.2)
Indirect Bilirubin: 0.8 mg/dL (ref 0.3–0.9)
Total Bilirubin: 1.1 mg/dL (ref 0.3–1.2)
Total Protein: 5.6 g/dL — ABNORMAL LOW (ref 6.5–8.1)

## 2020-09-20 LAB — BRAIN NATRIURETIC PEPTIDE
B Natriuretic Peptide: 1052.3 pg/mL — ABNORMAL HIGH (ref 0.0–100.0)
B Natriuretic Peptide: 1038.3 pg/mL — ABNORMAL HIGH (ref 0.0–100.0)

## 2020-09-20 LAB — TSH: TSH: 9.577 u[IU]/mL — ABNORMAL HIGH (ref 0.350–4.500)

## 2020-09-20 LAB — BASIC METABOLIC PANEL
Anion gap: 8 (ref 5–15)
Anion gap: 12 (ref 5–15)
BUN: 52 mg/dL — ABNORMAL HIGH (ref 8–23)
BUN: 52 mg/dL — ABNORMAL HIGH (ref 8–23)
CO2: 20 mmol/L — ABNORMAL LOW (ref 22–32)
CO2: 18 mmol/L — ABNORMAL LOW (ref 22–32)
Calcium: 9.1 mg/dL (ref 8.9–10.3)
Calcium: 9 mg/dL (ref 8.9–10.3)
Chloride: 108 mmol/L (ref 98–111)
Chloride: 106 mmol/L (ref 98–111)
Creatinine, Ser: 2.91 mg/dL — ABNORMAL HIGH (ref 0.44–1.00)
Creatinine, Ser: 2.89 mg/dL — ABNORMAL HIGH (ref 0.44–1.00)
GFR, Estimated: 16 mL/min — ABNORMAL LOW (ref >60)
GFR, Estimated: 16 mL/min — ABNORMAL LOW (ref >60)
Glucose, Bld: 81 mg/dL (ref 70–99)
Glucose, Bld: 101 mg/dL — ABNORMAL HIGH (ref 70–99)
Potassium: 5.1 mmol/L (ref 3.5–5.1)
Potassium: 5.7 mmol/L — ABNORMAL HIGH (ref 3.5–5.1)
Sodium: 136 mmol/L (ref 135–145)
Sodium: 136 mmol/L (ref 135–145)

## 2020-09-20 LAB — ECHOCARDIOGRAM COMPLETE
AR max vel: 0.79 cm2
AV Area VTI: 0.62 cm2
AV Area mean vel: 0.78 cm2
AV Mean grad: 10 mmHg
AV Peak grad: 17.8 mmHg
Ao pk vel: 2.11 m/s
Height: 59 in
S' Lateral: 3.2 cm
Weight: 3054.69 oz

## 2020-09-20 LAB — HEPARIN LEVEL (UNFRACTIONATED)
Heparin Unfractionated: 0.39 IU/mL (ref 0.30–0.70)
Heparin Unfractionated: 0.24 IU/mL — ABNORMAL LOW (ref 0.30–0.70)

## 2020-09-20 LAB — GLUCOSE, CAPILLARY
Glucose-Capillary: 56 mg/dL — ABNORMAL LOW (ref 70–99)
Glucose-Capillary: 104 mg/dL — ABNORMAL HIGH (ref 70–99)
Glucose-Capillary: 92 mg/dL (ref 70–99)
Glucose-Capillary: 132 mg/dL — ABNORMAL HIGH (ref 70–99)
Glucose-Capillary: 117 mg/dL — ABNORMAL HIGH (ref 70–99)
Glucose-Capillary: 108 mg/dL — ABNORMAL HIGH (ref 70–99)

## 2020-09-20 LAB — TYPE AND SCREEN
ABO/RH(D): O POS
Antibody Screen: NEGATIVE

## 2020-09-20 LAB — TROPONIN I (HIGH SENSITIVITY)
Troponin I (High Sensitivity): 2897 ng/L (ref <18)
Troponin I (High Sensitivity): 2918 ng/L (ref <18)

## 2020-09-20 LAB — LACTIC ACID, PLASMA: Lactic Acid, Venous: 1.2 mmol/L (ref 0.5–1.9)

## 2020-09-20 LAB — SARS CORONAVIRUS 2 (TAT 6-24 HRS): SARS Coronavirus 2: NEGATIVE

## 2020-09-20 LAB — PROCALCITONIN: Procalcitonin: 0.17 ng/mL

## 2020-09-20 MED ORDER — FUROSEMIDE 10 MG/ML IJ SOLN
80.0000 mg | Freq: Once | INTRAMUSCULAR | Status: AC
  Administered 2020-09-20: 80 mg via INTRAVENOUS
  Filled 2020-09-20: qty 8

## 2020-09-20 MED ORDER — HEPARIN (PORCINE) 25000 UT/250ML-% IV SOLN
1050.0000 [IU]/h | INTRAVENOUS | Status: DC
  Administered 2020-09-20: 950 [IU]/h via INTRAVENOUS
  Administered 2020-09-21: 1050 [IU]/h via INTRAVENOUS
  Filled 2020-09-20 (×2): qty 250

## 2020-09-20 MED ORDER — METOPROLOL SUCCINATE ER 25 MG PO TB24
25.0000 mg | ORAL_TABLET | Freq: Every day | ORAL | Status: DC
  Filled 2020-09-20: qty 1

## 2020-09-20 MED ORDER — HEPARIN SODIUM (PORCINE) 5000 UNIT/ML IJ SOLN: 5000.0000 [IU] | Freq: Three times a day (TID) | INTRAMUSCULAR | Status: DC

## 2020-09-20 MED ORDER — HEPARIN BOLUS VIA INFUSION
3500.0000 [IU] | Freq: Once | INTRAVENOUS | Status: AC
  Administered 2020-09-20: 3500 [IU] via INTRAVENOUS
  Filled 2020-09-20: qty 3500

## 2020-09-20 MED ORDER — INSULIN ASPART 100 UNIT/ML IJ SOLN: 0.0000 [IU] | Freq: Three times a day (TID) | INTRAMUSCULAR | Status: DC

## 2020-09-20 MED ORDER — SODIUM CHLORIDE 0.9 % IV SOLN
1.0000 g | INTRAVENOUS | Status: DC
  Administered 2020-09-20: 1 g via INTRAVENOUS
  Filled 2020-09-20: qty 10

## 2020-09-20 MED ORDER — FUROSEMIDE 10 MG/ML IJ SOLN
20.0000 mg/h | INTRAVENOUS | Status: DC
  Administered 2020-09-20 – 2020-09-21 (×3): 20 mg/h via INTRAVENOUS
  Filled 2020-09-20 (×4): qty 20

## 2020-09-20 MED ORDER — SODIUM ZIRCONIUM CYCLOSILICATE 10 G PO PACK
10.0000 g | PACK | Freq: Once | ORAL | Status: AC
  Administered 2020-09-20: 10 g via ORAL
  Filled 2020-09-20: qty 1

## 2020-09-20 MED ORDER — LEVOTHYROXINE SODIUM 50 MCG PO TABS
50.0000 ug | ORAL_TABLET | Freq: Every day | ORAL | Status: DC
  Administered 2020-09-20 – 2020-09-21 (×2): 50 ug via ORAL
  Filled 2020-09-20 (×2): qty 1

## 2020-09-20 MED ORDER — PERFLUTREN LIPID MICROSPHERE
1.0000 mL | INTRAVENOUS | Status: AC | PRN
  Administered 2020-09-20: 8 mL via INTRAVENOUS
  Filled 2020-09-20: qty 10

## 2020-09-20 NOTE — Progress Notes (Signed)
Pt. BP 89/69. Diuretic ordered. Okay to give per on call for cardiology.

## 2020-09-20 NOTE — Progress Notes (Addendum)
Subjective: Patient admitted this morning, see detailed H&P by Dr. Hal Hope 75 year old female with a history of bioprosthetic aortic valve, aortic stenosis, history of CAD s/p CABG, atrial fibrillation, diabetes mellitus type 2, chronic kidney disease stage III who was recently admitted on June 6 to June 8 for shortness of breath and CHF exacerbation at [facility] and was transferred to [facility] because of worsening shortness of breath, peripheral edema and renal dysfunction.  Lab work at that time showed BNP 21,000, hemoglobin 11.1, 2D echo head showed EF more than 55% with severe aortic stenosis.  Vitals:   09/20/20 1100 09/20/20 1200  BP: (!) 88/59 99/71  Pulse:  (!) 104  Resp: (!) 22 20  Temp:  97.7 F (36.5 C)  SpO2:  98%      A/P  Acute CHF -From severe aortic stenosis of bioprosthetic aortic valve -Started on Lasix infusion -Cardiology has seen the patient and deemed that he is not a good candidate for aggressive interventions -Recommended palliative care consultation for goals of care -We will consult palliative care for further clarification of goals of care. -Patient is DNR  Lower extremity cellulitis -Patient was started on ceftriaxone -Continue ceftriaxone for now   Diabetes mellitus type 2 -Continue sliding scale insulin with NovoLog  Hyperkalemia -Potassium 5.7 -We will give Lokelma 10 g p.o. x1   Atrial fibrillation -Continue Toprol XL -Continue Eliquis  Hypothyroidism -Continue Synthroid   Melinda Vang [facility] [phone]

## 2020-09-20 NOTE — Progress Notes (Addendum)
Update-CSW received callback from [place] with [facility] made referral for residential hospice for patient. Melinda Vang confirmed with CSW no bed availability today. Melinda Vang will follow up with CSW tomorrow.CSW updated patients sister Melinda Vang.  CSW received consult for residential hospice referral for patient. CSW called and spoke with Melinda Vang patients sister who confimed she would like for CSW to make referral to [facility] for patient. CSW left voicemail for Melinda Vang with [facility]. CSW awaiting callback. CSW will continue to follow.

## 2020-09-20 NOTE — Progress Notes (Signed)
ANTICOAGULATION CONSULT NOTE - Follow-Up Consult  Pharmacy Consult for Heparin Indication: atrial fibrillation  Allergies  Allergen Reactions   Amiodarone Nausea And Vomiting    Severe nausea   Eliquis [Apixaban] Nausea And Vomiting    Pt's sister remembers she had a N/V reaction when started- reported was really sick    Patient Measurements: Height: 4\' 11"  (149.9 cm) Weight: 86.6 kg (190 lb 14.7 oz) IBW/kg (Calculated) : 43.2 Heparin Dosing Weight: 63 kg  Vital Signs: Temp: 97.8 F (36.6 C) (06/09 1900) Temp Source: Oral (06/09 1900) BP: 92/64 (06/09 2000) Pulse Rate: 114 (06/09 2000)  Labs: Recent Labs    09/20/20 0030 09/20/20 0216 09/20/20 0705 09/20/20 1246 09/20/20 2138  HGB 11.3*  --  11.0*  --   --   HCT 37.5  --  36.8  --   --   PLT 180  --  189  --   --   HEPARINUNFRC  --   --   --  0.39 0.24*  CREATININE 2.91*  --  2.89*  --   --   TROPONINIHS 2,897* 2,918*  --   --   --      Estimated Creatinine Clearance: 15.8 mL/min (A) (by C-G formula based on SCr of 2.89 mg/dL (H)).   Medical History: Past Medical History:  Diagnosis Date   Anxiety    Developmental delay    Essential hypertension    GERD (gastroesophageal reflux disease)    Gout    History of anemia    Peripheral vascular disease (HCC)    Postoperative atrial fibrillation ([place])    Initially on amiodarone but discontinued due to severe nausea.   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 F presents from [facility] with SOB/volume overload. To begin heparin for afib. Prior records show pt on Eliquis sometime in the past but not recently. CBC ok on admission.  Heparin level this evening is  SUBtherapeutic (HL 0.24 << 0.39, goal of 0.3-0.7). No bleeding or issues per discussion with RN.  Goal of Therapy:  Heparin level 0.3-0.7 units/ml Monitor platelets by anticoagulation protocol: Yes   Plan: - Increase Heparin to 1050 units/hr (10.5 ml/hr) - Will continue to monitor for any signs/symptoms of bleeding and will follow up with heparin level in 8 hours   Thank you for allowing pharmacy to be a part of this patient's care.  Alycia Rossetti, PharmD, BCPS Clinical Pharmacist Clinical phone for 09/20/2020: [phone] 09/20/2020 10:43 PM   **Pharmacist phone directory can now be found on [email or web address] (PW [email or web address]).  Listed under [place].

## 2020-09-20 NOTE — H&P (Signed)
History and Physical    Melinda Vang SWN:[id number] DOB: December 20, 1945 DOA: 09/19/2020  PCP: Glenda Chroman, MD  Patient coming from: Patient was transferred from [facility].  Chief Complaint: Shortness of breath.  HPI: Melinda Vang is a 75 y.o. female with history of bioprosthetic aortic valve with severe stenosis with history of CAD status post CABG, A. fib, diabetes mellitus, chronic kidney disease stage III was recently admitted on June 6 through June 8 for shortness of breath and CHF at [facility] and was transferred to [facility] because of worsening shortness of breath peripheral edema and renal function.  Patient at the facility was placed on antibiotics for possible lower extremity cellulitis and also was placed on Lasix.  Patient's last labs available to US showed creatinine of 2.9 potassium 5.7 proBNP of 21,000 hemoglobin of 11.1 platelets of 163 WBC of 10.7.  2D echo done at the facility showed EF of more than 55% with severe aortic stenosis.  On exam at bedside patient does complain of shortness of breath and appears short of breath with elevated JVD and severe peripheral edema and abdominal distention.  Patient has development delay and provides limited history.  ED Course: Patient is a direct admit.  Review of Systems: As per HPI, rest all negative.   Past Medical History:  Diagnosis Date   Anxiety    Developmental delay    Essential hypertension    GERD (gastroesophageal reflux disease)    Gout    History of anemia    Peripheral vascular disease (HCC)    Postoperative atrial fibrillation ([place])    Initially on amiodarone but discontinued due to severe nausea.   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   SVG to LAD, SVG to OM and LPLB, SVG to LPDA,   Status post myomectomy 07/05/2011   Type 2 diabetes mellitus [facility])     Past Surgical  History:  Procedure Laterality Date   ABDOMINAL HYSTERECTOMY     AORTIC VALVE REPLACEMENT  07/04/2011   Procedure: AORTIC VALVE REPLACEMENT (AVR);  Surgeon: Rexene Alberts, MD;  Location: [place];  Service: Open Heart Surgery;  Laterality: N/A;   COLONOSCOPY N/A 09/14/2013   Procedure: COLONOSCOPY;  Surgeon: Rogene Houston, MD;  [facility];  Service: Endoscopy;  Laterality: N/A;  830   COLONOSCOPY W/ BIOPSIES AND POLYPECTOMY     CORONARY ARTERY BYPASS GRAFT  07/04/2011   Procedure: CORONARY ARTERY BYPASS GRAFTING (CABG);  Surgeon: Rexene Alberts, MD;  Location: [place];  Service: Open Heart Surgery;  Laterality: N/A;  four grafts using bilateral leg greater saphenous vein harvested endoscopically.   LEFT HEART CATHETERIZATION WITH CORONARY ANGIOGRAM N/A 07/04/2011   Procedure: LEFT HEART CATHETERIZATION WITH CORONARY ANGIOGRAM;  Surgeon: [name], MD;  Location: [facility] CATH LAB;  Service: Cardiovascular;  Laterality: N/A;   LOW ANTERIOR BOWEL RESECTION  2011   rectal cancer   MYOMECTOMY  07/04/2011   Procedure: MYOMECTOMY;  Surgeon: Rexene Alberts, MD;  Location: [place];  Service: Open Heart Surgery;  Laterality: N/A;  septal     reports that she quit smoking about 31 years ago. Her smoking use included cigarettes. She has a 20.00 pack-year smoking history. She has never used smokeless tobacco. She reports that she does not drink alcohol and does not use drugs.  Allergies  Allergen Reactions   Amiodarone Nausea And Vomiting    Severe nausea    Family History  Problem Relation Age of Onset   Heart failure Mother    Stroke Mother    Heart attack Father    Diabetes Sister    Hypertension Sister     Prior to Admission medications   Medication Sig Start Date End Date Taking? Authorizing Provider  aspirin EC 81 MG tablet Take 81 mg by mouth daily.    [provider]  atorvastatin (LIPITOR) 20 MG tablet Take 1 tablet (20 mg total) by mouth daily at 6 PM. 07/15/11    Barrett, Lodema Hong, PA-C  dronedarone (MULTAQ) 400 MG tablet Take 1 tablet (400 mg total) by mouth 2 (two) times daily with a meal. 08/25/19   Satira Sark, MD  furosemide (LASIX) 20 MG tablet Take 20 mg by mouth. 5 days weekly 04/11/13   [provider]  glipiZIDE (GLUCOTROL XL) 10 MG 24 hr tablet Take 10 mg by mouth 2 (two) times daily.    [provider]  levothyroxine (SYNTHROID, LEVOTHROID) 25 MCG tablet Take 1 tablet by mouth Daily. 11/11/11   [provider]  metoprolol tartrate (LOPRESSOR) 25 MG tablet Take 1 tablet (25 mg total) by mouth 2 (two) times daily. 07/15/11   Barrett, Erin R, PA-C  potassium chloride (K-DUR,KLOR-CON) 10 MEQ tablet Take 10 mEq by mouth daily.    [provider]  vitamin C (ASCORBIC ACID) 500 MG tablet Take 500 mg by mouth daily.    [provider]  Vitamin D, Ergocalciferol, (DRISDOL) 50000 UNITS CAPS Take 50,000 Units by mouth every 7 (seven) days. mondays    [provider]    Physical Exam: Constitutional: Moderately built and nourished. Vitals:   09/19/20 2342 09/20/20 0000  BP: 92/67   Pulse: (!) 113   Resp: 20   Temp: 98.3 F (36.8 C)   TempSrc: Oral   SpO2: 96%   Weight:  86.6 kg  Height:  4\' 11"  (1.499 m)   Eyes: Anicteric no pallor. ENMT: No discharge from the ears eyes nose and mouth. Neck: JVD elevated no mass felt. Respiratory: No rhonchi or crepitations. Cardiovascular: S1-S2 heard. Abdomen: Soft nontender bowel sounds present.  Distended. Musculoskeletal: Bilateral lower extremity edema extending up to thighs. Skin: No rash. Neurologic: Alert awake oriented to name.  Moving all extremities. Psychiatric: Oriented to name only.   Labs on Admission: I have personally reviewed following labs and imaging studies  CBC: No results for input(s): WBC, NEUTROABS, HGB, HCT, MCV, PLT in the last 168 hours. Basic Metabolic Panel: No results for input(s): NA, K, CL, CO2, GLUCOSE, BUN,  CREATININE, CALCIUM, MG, PHOS in the last 168 hours. GFR: CrCl cannot be calculated (Patient's most recent lab result is older than the maximum 21 days allowed.). Liver Function Tests: No results for input(s): AST, ALT, ALKPHOS, BILITOT, PROT, ALBUMIN in the last 168 hours. No results for input(s): LIPASE, AMYLASE in the last 168 hours. No results for input(s): AMMONIA in the last 168 hours. Coagulation Profile: No results for input(s): INR, PROTIME in the last 168 hours. Cardiac Enzymes: No results for input(s): CKTOTAL, CKMB, CKMBINDEX, TROPONINI in the last 168 hours. BNP (last 3 results) No results for input(s): PROBNP in the last 8760 hours. HbA1C: No results for input(s): HGBA1C in the last 72 hours. CBG: Recent Labs  Lab 09/19/20 2339  GLUCAP 81   Lipid Profile: No results for input(s): CHOL,  HDL, LDLCALC, TRIG, CHOLHDL, LDLDIRECT in the last 72 hours. Thyroid Function Tests: No results for input(s): TSH, T4TOTAL, FREET4, T3FREE, THYROIDAB in the last 72 hours. Anemia Panel: No results for input(s): VITAMINB12, FOLATE, FERRITIN, TIBC, IRON, RETICCTPCT in the last 72 hours. Urine analysis: No results found for: COLORURINE, APPEARANCEUR, LABSPEC, PHURINE, GLUCOSEU, HGBUR, BILIRUBINUR, KETONESUR, PROTEINUR, UROBILINOGEN, NITRITE, LEUKOCYTESUR Sepsis Labs: @LABRCNTIP (procalcitonin:4,lacticidven:4) )No results found for this or any previous visit (from the past 240 hour(s)).   Radiological Exams on Admission: No results found.  EKG: Independently reviewed.  A. fib rate rate around 105 bpm.  Assessment/Plan Principal Problem:   Acute CHF (congestive heart failure) (HCC) Active Problems:   S/P CABG x 4   S/P AVR (aortic valve replacement)   Postoperative atrial fibrillation (HCC)   ARF (acute renal failure) (HCC)    Acute CHF with severe aortic stenosis of the bioprosthetic aortic valve -discussed with on-call cardiologist Dr. Ailene Ravel who advised patient to be placed  on Lasix 80 mg IV 1 dose followed by Lasix drip.  Closely monitor intake output metabolic panel daily weights and recheck 2D echo. Elevated troponin likely from decompensated CHF presently on heparin and beta-blockers.  Denies any chest pain. A. fib rate around 105 bpm on Toprol.  Check TSH.  On beta-blocker.  Was recently started on Eliquis previously on heparin. Acute on chronic kidney disease stage III last creatinine in 2019 was around 1.5.  Now it is around 2.9.  We will hold lisinopril but continue Lasix for now and closely monitor.  Check UA. Possible lower extremity cellulitis we will keep patient on ceftriaxone.  More likely could be from edema and weeping wounds.  We will also wound team consult. Diabetes mellitus type 2 we will keep patient on sliding scale coverage. Hypothyroidism on Synthroid. History of CAD status post CABG with elevated troponin see #2. History of development delay.   COVID test is pending.  Since patient has acute CHF with worsening renal function and has anasarca will need close monitoring for any further worsening in inpatient status.   DVT prophylaxis: Heparin infusion. Code Status: DNR. Family Communication: We will need to discuss with family. Disposition Plan: To be determined. Consults called: Cardiology. Admission status: Inpatient.   Rise Patience MD [facility] Pager [phone].  If 7PM-7AM, please contact night-coverage [email or web address] Password [email or web address]  09/20/2020, 12:50 AM

## 2020-09-20 NOTE — Plan of Care (Signed)
  Problem: Clinical Measurements: Goal: Respiratory complications will improve Outcome: Not Progressing Pt. C/o SOB, refusing O2 at this time. Crackles/wheezing heard upon auscultation.    Problem: Activity: Goal: Capacity to carry out activities will improve Outcome: Not Progressing

## 2020-09-20 NOTE — Consult Note (Addendum)
Cardiology Consultation:   Patient ID: TAMULA MORRICAL MRN: [id number]; DOB: 1945-11-02  Admit date: 09/19/2020 Date of Consult: 09/20/2020  Primary Care Provider: Glenda Chroman, MD [facility] Cardiologist: Rozann Lesches, MD [place] Electrophysiologist:  None  Patient Profile:   JOSHUA ZERINGUE is a 75 y.o. female with severe AS s/p BP AVR (2013), CABG (2013, SVG-LAD, SVG-OM/LPMB, SVG-LPDA), HTN, pAF, DM2, and developmental delay who presents with worsening SOB and volume overload in the context of BP AVR stenosis.   History of Present Illness:   Ms. Fromer history is limited by her profound shortness of breath and developmental delay however she reports that she is had acute on chronic worsening of shortness of breath, weight gain, lower extremity swelling, and orthopnea.  She is known to have HFpEF however current presentation is much worse than prior episodes.  She lives with her sister who seems to get care of most of her needs.  She does have significant coronary history of her denies any chest pain/pressure, or anginal equivalents.  She is in AF on admission however her rates are relatively well controlled.  I do think that her current presentation is most likely secondary to bioprosthetic AVR stenosis resulting in worsening HFpEF and profound volume overload.  Past Medical History:  Diagnosis Date   Anxiety    Developmental delay    Essential hypertension    GERD (gastroesophageal reflux disease)    Gout    History of anemia    Peripheral vascular disease (HCC)    Postoperative atrial fibrillation ([place])    Initially on amiodarone but discontinued due to severe nausea.   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)    Past  Surgical History:  Procedure Laterality Date   ABDOMINAL HYSTERECTOMY     AORTIC VALVE REPLACEMENT  07/04/2011   Procedure: AORTIC VALVE REPLACEMENT (AVR);  Surgeon: Rexene Alberts, MD;  Location: [place];  Service: Open Heart Surgery;  Laterality: N/A;   COLONOSCOPY N/A 09/14/2013   Procedure: COLONOSCOPY;  Surgeon: Rogene Houston, MD;  [facility];  Service: Endoscopy;  Laterality: N/A;  830   COLONOSCOPY W/ BIOPSIES AND POLYPECTOMY     CORONARY ARTERY BYPASS GRAFT  07/04/2011   Procedure: CORONARY ARTERY BYPASS GRAFTING (CABG);  Surgeon: Rexene Alberts, MD;  Location: [place];  Service: Open Heart Surgery;  Laterality: N/A;  four grafts using bilateral leg greater saphenous vein harvested endoscopically.   LEFT HEART CATHETERIZATION WITH CORONARY ANGIOGRAM N/A 07/04/2011   Procedure: LEFT HEART CATHETERIZATION WITH CORONARY ANGIOGRAM;  Surgeon: [name], MD;  Location: [facility] CATH LAB;  Service: Cardiovascular;  Laterality: N/A;   LOW ANTERIOR BOWEL RESECTION  2011   rectal cancer   MYOMECTOMY  07/04/2011   Procedure: MYOMECTOMY;  Surgeon: Rexene Alberts, MD;  Location: [place];  Service: Open Heart Surgery;  Laterality: N/A;  septal    Home Medications:  Prior to Admission medications   Medication Sig Start Date End Date Taking? Authorizing Provider  aspirin EC 81 MG tablet Take 81 mg by mouth daily.    [provider]  atorvastatin (LIPITOR) 20 MG tablet Take 1 tablet (20 mg total) by mouth daily  at 6 PM. 07/15/11   Barrett, Lodema Hong, PA-C  dronedarone (MULTAQ) 400 MG tablet Take 1 tablet (400 mg total) by mouth 2 (two) times daily with a meal. 08/25/19   Satira Sark, MD  furosemide (LASIX) 20 MG tablet Take 20 mg by mouth. 5 days weekly 04/11/13   [provider]  glipiZIDE (GLUCOTROL XL) 10 MG 24 hr tablet Take 10 mg by mouth 2 (two) times daily.    [provider]  levothyroxine (SYNTHROID, LEVOTHROID) 25 MCG tablet Take 1 tablet by mouth Daily.  11/11/11   [provider]  metoprolol tartrate (LOPRESSOR) 25 MG tablet Take 1 tablet (25 mg total) by mouth 2 (two) times daily. 07/15/11   Barrett, Erin R, PA-C  potassium chloride (K-DUR,KLOR-CON) 10 MEQ tablet Take 10 mEq by mouth daily.    [provider]  vitamin C (ASCORBIC ACID) 500 MG tablet Take 500 mg by mouth daily.    [provider]  Vitamin D, Ergocalciferol, (DRISDOL) 50000 UNITS CAPS Take 50,000 Units by mouth every 7 (seven) days. mondays    [provider]   Inpatient Medications: Scheduled Meds:  heparin  5,000 Units Subcutaneous Q8H   Continuous Infusions:  PRN Meds:  Allergies:    Allergies  Allergen Reactions   Amiodarone Nausea And Vomiting    Severe nausea    Social History:   Social History   Socioeconomic History   Marital status: Divorced    Spouse name: Not on file   Number of children: Not on file   Years of education: Not on file   Highest education level: Not on file  Occupational History   Not on file  Tobacco Use   Smoking status: Former    Packs/day: 1.00    Years: 20.00    Pack years: 20.00    Types: Cigarettes    Quit date: 04/14/1989    Years since quitting: 31.4   Smokeless tobacco: Never   Tobacco comments:    Quit > 20 years ago  Substance and Sexual Activity   Alcohol use: No    Alcohol/week: 0.0 standard drinks   Drug use: No   Sexual activity: Never    Birth control/protection: Post-menopausal  Other Topics Concern   Not on file  Social History Narrative   Pt lives alone and family checks on her daily.    Social Determinants of Health   Financial Resource Strain: Not on file  Food Insecurity: Not on file  Transportation Needs: Not on file  Physical Activity: Not on file  Stress: Not on file  Social Connections: Not on file  Intimate Partner Violence: Not on file    Family History:    Family History  Problem Relation Age of Onset   Heart failure Mother    Stroke Mother     Heart attack Father    Diabetes Sister    Hypertension Sister     ROS Review of Systems: [y] = yes, [ ]  = no      General: Weight gain [y]; Weight loss [ ] ; Anorexia [ ] ; Fatigue [y]; Fever [ ] ; Chills [ ] ; Weakness [ ]    Cardiac: Chest pain/pressure [ ] ; Resting SOB [y]; Exertional SOB [y]; Orthopnea [y]; Pedal Edema [y]; Palpitations [ ] ; Syncope [ ] ; Presyncope [ ] ; Paroxysmal nocturnal dyspnea [ ]    Pulmonary: Cough [ ] ; Wheezing [ ] ; Hemoptysis [ ] ; Sputum [ ] ; Snoring [ ]    GI: Vomiting [ ] ; Dysphagia [ ] ;  Melena [ ] ; Hematochezia [ ] ; Heartburn [ ] ; Abdominal pain [ ] ; Constipation [ ] ; Diarrhea [ ] ; BRBPR [ ]    GU: Hematuria [ ] ; Dysuria [ ] ; Nocturia [ ]  Vascular: Pain in legs with walking [ ] ; Pain in feet with lying flat [ ] ; Non-healing sores [ ] ; Stroke [ ] ; TIA [ ] ; Slurred speech [ ] ;   Neuro: Headaches [ ] ; Vertigo [ ] ; Seizures [ ] ; Paresthesias [ ] ;Blurred vision [ ] ; Diplopia [ ] ; Vision changes [ ]    Ortho/Skin: Arthritis [ ] ; Joint pain [ ] ; Muscle pain [ ] ; Joint swelling [ ] ; Back Pain [ ] ; Rash [ ]    Psych: Depression [ ] ; Anxiety [ ]    Heme: Bleeding problems [ ] ; Clotting disorders [ ] ; Anemia [ ]    Endocrine: Diabetes [ ] ; Thyroid dysfunction [ ]    Physical Exam/Data:   Vitals:   09/19/20 2342 09/20/20 0000 09/20/20 0035  BP: 92/67  92/67  Pulse: (!) 113  (!) 113  Resp: 20  (!) 24  Temp: 98.3 F (36.8 C)  98.3 F (36.8 C)  TempSrc: Oral    SpO2: 96%    Weight:  86.6 kg   Height:  4\' 11"  (1.499 m)    No intake or output data in the 24 hours ending 09/20/20 0256 Last 3 Weights 09/20/2020 08/24/2019 02/27/2017  Weight (lbs) 190 lb 14.7 oz 170 lb 182 lb 12.8 oz  Weight (kg) 86.6 kg 77.111 kg 82.918 kg     Body mass index is 38.56 kg/m.  General: ill appearing, breathless, talking in short sentences HEENT: normal Lymph: no adenopathy Neck: JVP to mandible at 30 degrees Endocrine:  No thryomegaly Vascular: No carotid bruits; FA pulses 2+ bilaterally  without bruits  Cardiac:  normal S1, S2; RRR; 3/6 systolic murmur loudest at RUSB Lungs: b/l crackles in lower to mid lung fields  Abd: diffuse anasarca, mild pain with palpation but non focal  Ext: 3+ pitting edema to abdomen  Musculoskeletal:  No deformities, BUE and BLE strength normal and equal Skin: warm and dry  Neuro:  CNs 2-12 intact, no focal abnormalities noted Psych: normal affect  EKG:  The EKG was personally reviewed and demonstrates: AF without new signs of ischemia  Telemetry:  Telemetry was personally reviewed and demonstrates: AF  Relevant CV Studies:  TTE  Result date: 09/10/17 - Left ventricle: The cavity size was normal. Wall thickness was    normal. Systolic function was vigorous. The estimated ejection    fraction was in the range of 65% to 70%. Features are consistent    with a pseudonormal left ventricular filling pattern, with    concomitant abnormal relaxation and increased filling pressure    (grade 2 diastolic dysfunction). Doppler parameters are    consistent with high ventricular filling pressure.  - Aortic valve: Mean gradient (S): 23 mm Hg. Mean gradient within    normal reported range for this prosthetic valve.  - Atrial septum: No defect or patent foramen ovale was identified.  - Technically difficult study.   Laboratory Data:  High Sensitivity Troponin:   Recent Labs  Lab 09/20/20 0030  TROPONINIHS 2,897*     Chemistry Recent Labs  Lab 09/20/20 0030  NA 136  K 5.1  CL 108  CO2 20*  GLUCOSE 81  BUN 52*  CREATININE 2.91*  CALCIUM 9.1  GFRNONAA 16*  ANIONGAP 8    Recent Labs  Lab 09/20/20 0030  PROT 5.6*  ALBUMIN 2.4*  AST 36  ALT 13  ALKPHOS 93  BILITOT 1.1   Hematology Recent Labs  Lab 09/20/20 0030  WBC 12.2*  RBC 4.15  HGB 11.3*  HCT 37.5  MCV 90.4  MCH 27.2  MCHC 30.1  RDW 17.5*  PLT 180   BNP Recent Labs  Lab 09/20/20 0030  BNP 1,052.3*    DDimer No results for input(s): DDIMER in the last 168  hours.  Radiology/Studies:  DG CHEST PORT 1 VIEW  Result Date: 09/20/2020 CLINICAL DATA:  Dyspnea EXAM: PORTABLE CHEST 1 VIEW COMPARISON:  09/17/2020 FINDINGS: Pulmonary insufflation is stable and symmetric. Small left pleural effusion is unchanged. No confluent pulmonary infiltrate. No pneumothorax. Coronary artery bypass grafting and aortic valve replacement have been performed. Mild cardiomegaly is stable. There is enlargement of the central pulmonary arteries in keeping with changes of pulmonary arterial hypertension, unchanged. No superimposed overt pulmonary edema. No acute bone abnormality. IMPRESSION: Stable examination with small left pleural effusion, mild cardiomegaly, and central pulmonary arterial enlargement in keeping with pulmonary artery hypertension. Electronically Signed   By: Fidela Salisbury MD   On: 09/20/2020 00:47   { Assessment and Plan:   HFpEF BP AVR stenosis  Ms. Brayman presents with acute on chronic worsening shortness of breath, fatigue, and general deconditioning.  She was transferred from outside hospital for evaluation of her aortic valve.  She is profoundly deconditioned with diffuse anasarca.  She is not sure what her dry weight is and I think her developmental delay hinders her ability to manage her health so she relies fairly heavily on her sister who was not present during my discussion with her.  She feels like she is gradually worsened over the past few months although she did say that only a few days ago she was still able to walk on her own.  Her exam is notable for a prominent systolic murmur over the RUSB consistent with aortic stenosis.  She is profoundly volume overloaded and will need aggressive diuresis.  Her creatinine is significantly higher than her baseline (sCr 2.9 from bl 1.2-1.3) however hopefully we can decongest her and get her back to her baseline renal fxn. Lactate fortunately normal as I was initially c/f low output HF in the setting of severe AS  given worsening sCr and volume overload.  Per chart review there was discussion of whether she would be a candidate for a valve in valve TAVR given her debility.  Lab work was notable for a significant troponin elevation and elevated BNP (1052).  Her troponins were elevated but otherwise flat (hsT 2897->2918).  Plan for echo today and aggressive diuresis prior to discussion of possible TAVR. Will need RHC/cors if she is TAVR candidate and preop imaging. Will leave on heparin for now pending further invasive studies.  - start heparin gtt (was on eliquis recently as an OP, CHA2DS2-VASc (6) - start diuresis with lasix 80 mg IV x1 followed by gtt (20 mg/h), monitor BMP/Mg bid for now  - continue metop tartrate 25 mg PO bid for AF - TTE ordered as above  - careful monitoring of IO, if UOP doesn't pick up with lasix then may need dobutamine to augment diuresis, would get RHC first if not diuresing and if sCr worsening, will see how she does this morning   For questions or updates, please contact [facility] Please consult [email or web address] for contact info under   Signed, Dion Body, MD  09/20/2020 2:56 AM

## 2020-09-20 NOTE — Progress Notes (Signed)
ANTICOAGULATION CONSULT NOTE - Initial Consult  Pharmacy Consult for Heparin Indication: atrial fibrillation  Allergies  Allergen Reactions   Amiodarone Nausea And Vomiting    Severe nausea    Patient Measurements: Height: 4\' 11"  (149.9 cm) Weight: 86.6 kg (190 lb 14.7 oz) IBW/kg (Calculated) : 43.2 Heparin Dosing Weight: 63 kg  Vital Signs: Temp: 98.3 F (36.8 C) (06/09 0035) Temp Source: Oral (06/08 2342) BP: 92/67 (06/09 0035) Pulse Rate: 113 (06/09 0035)  Labs: Recent Labs    09/20/20 0030 09/20/20 0216  HGB 11.3*  --   HCT 37.5  --   PLT 180  --   CREATININE 2.91*  --   TROPONINIHS 2,897* 2,918*    Estimated Creatinine Clearance: 15.7 mL/min (A) (by C-G formula based on SCr of 2.91 mg/dL (H)).   Medical History: Past Medical History:  Diagnosis Date   Anxiety    Developmental delay    Essential hypertension    GERD (gastroesophageal reflux disease)    Gout    History of anemia    Peripheral vascular disease (HCC)    Postoperative atrial fibrillation ([place])    Initially on amiodarone but discontinued due to severe nausea.   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 F presents from [facility] with SOB/volume overload. To begin heparin for afib. Prior records show pt on Eliquis sometime in the past but not recently. CBC ok on admission.  Goal of Therapy:  Heparin level 0.3-0.7 units/ml Monitor platelets by anticoagulation protocol: Yes   Plan:  Heparin IV bolus 3500 units Heparin gtt at 950 units/hr Will f/u heparin level in 8 hours Daily heparin level and CBC  Sherlon Handing, PharmD, BCPS Please see amion for complete clinical pharmacist phone  list 09/20/2020,4:28 AM

## 2020-09-20 NOTE — Progress Notes (Signed)
   09/20/20 1138  Mobility  Activity Contraindicated/medical hold (hold per RN)

## 2020-09-20 NOTE — Progress Notes (Signed)
ANTICOAGULATION CONSULT NOTE - Initial Consult  Pharmacy Consult for Heparin Indication: atrial fibrillation  Allergies  Allergen Reactions   Amiodarone Nausea And Vomiting    Severe nausea    Patient Measurements: Height: 4\' 11"  (149.9 cm) Weight: 86.6 kg (190 lb 14.7 oz) IBW/kg (Calculated) : 43.2 Heparin Dosing Weight: 63 kg  Vital Signs: Temp: 97.7 F (36.5 C) (06/09 1200) Temp Source: Oral (06/09 1200) BP: 99/71 (06/09 1200) Pulse Rate: 104 (06/09 1200)  Labs: Recent Labs    09/20/20 0030 09/20/20 0216 09/20/20 0705 09/20/20 1246  HGB 11.3*  --  11.0*  --   HCT 37.5  --  36.8  --   PLT 180  --  189  --   HEPARINUNFRC  --   --   --  0.39  CREATININE 2.91*  --  2.89*  --   TROPONINIHS 2,897* 2,918*  --   --      Estimated Creatinine Clearance: 15.8 mL/min (A) (by C-G formula based on SCr of 2.89 mg/dL (H)).   Medical History: Past Medical History:  Diagnosis Date   Anxiety    Developmental delay    Essential hypertension    GERD (gastroesophageal reflux disease)    Gout    History of anemia    Peripheral vascular disease (HCC)    Postoperative atrial fibrillation ([place])    Initially on amiodarone but discontinued due to severe nausea.   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 F presents from [facility] with SOB/volume overload. To begin heparin for afib. Prior records show pt on Eliquis sometime in the past but not recently. CBC ok on admission.  Heparin level therapeutic  Goal of Therapy:  Heparin level 0.3-0.7 units/ml Monitor platelets by anticoagulation protocol: Yes   Plan:  Continue Heparin gtt at 950 units/hr Confirmatory 8 hour  level Daily heparin level and CBC   Alanda Slim, PharmD, [facility] Clinical Pharmacist Please see AMION for all Pharmacists' Contact Phone Numbers 09/20/2020, 2:07 PM

## 2020-09-20 NOTE — Consult Note (Signed)
Consult for goals of care clarification completed  Met with patient son Shanon Brow and her sister Marcie Bal (who has been providing most of the care for Ms Selvage in her home)  Discussion regarding current physical status and mental status.  Unfortunately Ms. Tagg has suffered a severe decline in status over the past 2 months.  Her cardiac and renal systems are failing.  She is not a candidate for dialysis and she is currently a DNR code status.  After discussion goals and quality of life options the family agreed that hospice was appropriate.  Patient is declining quickly, has refused anything by mouth, very low BP.  Expect she will succumb to multisystem failure in approx. A few days to maybe a week without any PO intake.  Referral placed to [facility] : [facility] team for referral to inpatient hospice facility in [place].  Will follow up with family tomorrow for any further questions.  Kizzie Fantasia, MSN, RN-BC, [facility], [facility] Palliative Clinical Specialist [facility]

## 2020-09-20 NOTE — Progress Notes (Addendum)
Progress Note  Patient Name: Melinda Vang Date of Encounter: 09/20/2020  [facility] Cardiologist: Rozann Lesches, MD   Subjective   Pt is very short of breath, but sats normal on room air, wheezing throughout.   Inpatient Medications    Scheduled Meds:  insulin aspart  0-9 Units Subcutaneous TID WC   levothyroxine  50 mcg Oral Q0600   metoprolol succinate  25 mg Oral Daily   Continuous Infusions:  furosemide (LASIX) 200 mg in dextrose 5% 100 mL (2mg /mL) infusion 20 mg/hr (09/20/20 0545)   heparin 950 Units/hr (09/20/20 0550)   PRN Meds:    Vital Signs    Vitals:   09/20/20 0000 09/20/20 0035 09/20/20 0501 09/20/20 0802  BP:  92/67 (!) 89/69 (!) 86/63  Pulse:  (!) 113 (!) 106 99  Resp:  (!) 24 (!) 22 19  Temp:  98.3 F (36.8 C) 98.3 F (36.8 C) 98.1 F (36.7 C)  TempSrc:   Oral Oral  SpO2:   95% 96%  Weight: 86.6 kg     Height: 4\' 11"  (1.499 m)       Intake/Output Summary (Last 24 hours) at 09/20/2020 1025 Last data filed at 09/20/2020 0903 Gross per 24 hour  Intake 193.66 ml  Output --  Net 193.66 ml   Last 3 Weights 09/20/2020 08/24/2019 02/27/2017  Weight (lbs) 190 lb 14.7 oz 170 lb 182 lb 12.8 oz  Weight (kg) 86.6 kg 77.111 kg 82.918 kg      Telemetry    Afib with ventricular rates in the 100s - Personally Reviewed  ECG    No new tracings - Personally Reviewed  Physical Exam   GEN: obese female in mild distress Neck: No JVD - exam difficult Cardiac: irregular rhythm, tachycardic rate, + systolic murmur Respiratory: wheezing throughout GI: Soft, nontender, non-distended  MS: 3-4+ edema up to chest Neuro:  Nonfocal  Psych: Normal affect   Labs    High Sensitivity Troponin:   Recent Labs  Lab 09/20/20 0030 09/20/20 0216  TROPONINIHS 2,897* 2,918*      Chemistry Recent Labs  Lab 09/20/20 0030 09/20/20 0705  NA 136 136  K 5.1 5.7*  CL 108 106  CO2 20* 18*  GLUCOSE 81 101*  BUN 52* 52*  CREATININE 2.91* 2.89*  CALCIUM  9.1 9.0  PROT 5.6*  --   ALBUMIN 2.4*  --   AST 36  --   ALT 13  --   ALKPHOS 93  --   BILITOT 1.1  --   GFRNONAA 16* 16*  ANIONGAP 8 12     Hematology Recent Labs  Lab 09/20/20 0030 09/20/20 0705  WBC 12.2* 12.6*  RBC 4.15 4.04  HGB 11.3* 11.0*  HCT 37.5 36.8  MCV 90.4 91.1  MCH 27.2 27.2  MCHC 30.1 29.9*  RDW 17.5* 17.8*  PLT 180 189    BNP Recent Labs  Lab 09/20/20 0030 09/20/20 0219  BNP 1,052.3* 1,038.3*     DDimer No results for input(s): DDIMER in the last 168 hours.   Radiology    DG CHEST PORT 1 VIEW  Result Date: 09/20/2020 CLINICAL DATA:  Dyspnea EXAM: PORTABLE CHEST 1 VIEW COMPARISON:  09/17/2020 FINDINGS: Pulmonary insufflation is stable and symmetric. Small left pleural effusion is unchanged. No confluent pulmonary infiltrate. No pneumothorax. Coronary artery bypass grafting and aortic valve replacement have been performed. Mild cardiomegaly is stable. There is enlargement of the central pulmonary arteries in keeping with changes of pulmonary arterial hypertension,  unchanged. No superimposed overt pulmonary edema. No acute bone abnormality. IMPRESSION: Stable examination with small left pleural effusion, mild cardiomegaly, and central pulmonary arterial enlargement in keeping with pulmonary artery hypertension. Electronically Signed   By: Fidela Salisbury MD   On: 09/20/2020 00:47    Cardiac Studies   Echo pending   Echo 2019: Study Conclusions  - Left ventricle: The cavity size was normal. Wall thickness was    normal. Systolic function was vigorous. The estimated ejection    fraction was in the range of 65% to 70%. Features are consistent    with a pseudonormal left ventricular filling pattern, with    concomitant abnormal relaxation and increased filling pressure    (grade 2 diastolic dysfunction). Doppler parameters are    consistent with high ventricular filling pressure.  - Aortic valve: Mean gradient (S): 23 mm Hg. Mean gradient within     normal reported range for this prosthetic valve.  - Atrial septum: No defect or patent foramen ovale was identified.  - Technically difficult study.   Patient Profile     75 y.o. female with severe AS s/p BP AVR (2013), CABG (2013, SVG-LAD, SVG-OM/LPMB, SVG-LPDA), HTN, pAF, DM2, and developmental delay who presents with worsening SOB and volume overload in the context of BP AVR stenosis.  Assessment & Plan    Acute on chronic diastolic heart failure - echo in 2019 and 1194 with diastolic dysfunction - echo pending - she has significant edema and wheezing throughout, edematous up to chest - BNP on arrival > 1000 with acute on chronic renal insufficiency - overnight fellow started lasix drip running at 10 mg/hr - I&Os unfortunately incomplete, was incontinent overnight (?problems with purewick) - interestingly not hypoxic - hypotensive in the 80-90s with adequate MAPs - asymptomatic - continue lasix drip for now, collect output   Severe AS s/p AVR 2013 - echo pending - echo in 2019 with gradients reportedly within normal range for her valve type   Hypotension - pt asymptomatic so far - sBP 80-90s, MAP > 60   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 Echo results pending. Given her kidney failure, hypervolemia, hypotension, and Afib, prognosis guarded. Would recommend palliative care vs hospice consultation. Family at bedside.      For questions or updates, please contact [place] Please consult [email or web address] for contact info under        Signed, Ledora Bottcher, PA  09/20/2020, 10:25 AM    Attending Note:   The patient was seen and examined.   Agree with assessment and plan as noted above.  Changes made to the above note as needed.  Patient seen and independently examined with Doreene Adas, PA .   We discussed all aspects of the encounter. I agree with the assessment and plan as stated above.     Respiratory distress :  prelim views of echo show mid ant. Septal akinesis.   Very difficult images due to body habitus and lack of cooperation  She is not a candidate for cath ( ckd,   inability to cooperate,  comorbid conditions) She coughs while eating - this may also be chronic aspiration  I think a palliative care / hospice approach is appropriate She is a DNR.   I agree that she is a poor candidate for aggressive measures I have discussed with Dr. Darrick Meigs.   2.  CKD:   progressive     I have spent a total of 40 minutes with patient reviewing hospital  notes , telemetry, EKGs, labs and examining patient as well as establishing an assessment and plan that was discussed with the patient.  > 50% of time was spent in direct patient care.    Thayer Headings, Brooke Bonito., MD, [facility] 09/20/2020, 11:32 AM [address]  [place] Pager [phone]

## 2020-09-20 NOTE — Progress Notes (Signed)
Heart Failure Navigator Progress Note  Assessed for Heart & Vascular TOC clinic readiness.  Unfortunately at this time the patient does not meet criteria due to AKI on CKD III. SCr up to 2.9 (baseline 1.5).  Navigator available for reassessment of patient.   Kerby Nora, PharmD, BCPS Heart Failure Stewardship Pharmacist Phone [phone]

## 2020-09-20 NOTE — Progress Notes (Signed)
Pt. With critical Troponin of 2897. On call for [facility] paged to make aware. New orders received. MD on the unit to see pt.

## 2020-09-21 DIAGNOSIS — I5041 Acute combined systolic (congestive) and diastolic (congestive) heart failure: Secondary | ICD-10-CM

## 2020-09-21 DIAGNOSIS — Z951 Presence of aortocoronary bypass graft: Secondary | ICD-10-CM

## 2020-09-21 LAB — CBC
HCT: 37.1 % (ref 36.0–46.0)
Hemoglobin: 11.3 g/dL — ABNORMAL LOW (ref 12.0–15.0)
MCH: 27.3 pg (ref 26.0–34.0)
MCHC: 30.5 g/dL (ref 30.0–36.0)
MCV: 89.6 fL (ref 80.0–100.0)
Platelets: 177 10*3/uL (ref 150–400)
RBC: 4.14 MIL/uL (ref 3.87–5.11)
RDW: 17.9 % — ABNORMAL HIGH (ref 11.5–15.5)
WBC: 12.4 10*3/uL — ABNORMAL HIGH (ref 4.0–10.5)
nRBC: 0 % (ref 0.0–0.2)

## 2020-09-21 LAB — GLUCOSE, CAPILLARY
Glucose-Capillary: 98 mg/dL (ref 70–99)
Glucose-Capillary: 121 mg/dL — ABNORMAL HIGH (ref 70–99)
Glucose-Capillary: 112 mg/dL — ABNORMAL HIGH (ref 70–99)

## 2020-09-21 LAB — HEPARIN LEVEL (UNFRACTIONATED): Heparin Unfractionated: 0.41 IU/mL (ref 0.30–0.70)

## 2020-09-21 LAB — BASIC METABOLIC PANEL
Anion gap: 13 (ref 5–15)
BUN: 53 mg/dL — ABNORMAL HIGH (ref 8–23)
CO2: 18 mmol/L — ABNORMAL LOW (ref 22–32)
Calcium: 9.3 mg/dL (ref 8.9–10.3)
Chloride: 105 mmol/L (ref 98–111)
Creatinine, Ser: 2.87 mg/dL — ABNORMAL HIGH (ref 0.44–1.00)
GFR, Estimated: 16 mL/min — ABNORMAL LOW (ref >60)
Glucose, Bld: 98 mg/dL (ref 70–99)
Potassium: 5.4 mmol/L — ABNORMAL HIGH (ref 3.5–5.1)
Sodium: 136 mmol/L (ref 135–145)

## 2020-09-21 LAB — HEMOGLOBIN A1C
Hgb A1c MFr Bld: 7.6 % — ABNORMAL HIGH (ref 4.8–5.6)
Mean Plasma Glucose: 171 mg/dL

## 2020-09-21 MED ORDER — MORPHINE SULFATE (CONCENTRATE) 10 MG/0.5ML PO SOLN: 5.0000 mg | ORAL | Status: DC | PRN

## 2020-09-21 MED ORDER — GLYCOPYRROLATE 0.2 MG/ML IJ SOLN: 0.1000 mg | Freq: Four times a day (QID) | INTRAMUSCULAR | Status: DC | PRN

## 2020-09-21 MED ORDER — LORAZEPAM 2 MG/ML IJ SOLN: 1.0000 mg | INTRAMUSCULAR | Status: DC | PRN

## 2020-09-21 MED ORDER — MORPHINE SULFATE (CONCENTRATE) 10 MG/0.5ML PO SOLN
5.0000 mg | ORAL | Status: DC | PRN
  Administered 2020-09-21: 5 mg via ORAL
  Filled 2020-09-21: qty 0.5

## 2020-09-21 MED ORDER — LORAZEPAM 1 MG PO TABS: 1.0000 mg | ORAL_TABLET | ORAL | Status: DC | PRN

## 2020-09-21 MED ORDER — LORAZEPAM 2 MG/ML PO CONC: 1.0000 mg | ORAL | Status: DC | PRN

## 2020-09-21 NOTE — Plan of Care (Signed)

## 2020-09-21 NOTE — Progress Notes (Signed)
D/C instructions printed and placed in packet at nurse's station along with DNR. 

## 2020-09-21 NOTE — Progress Notes (Signed)
This Rn attempted to call report to receiving facility numerous times and left message with call back number. No response received, oncoming RN aware.

## 2020-09-21 NOTE — Plan of Care (Signed)
  Problem: Education: Goal: Knowledge of General Education information will improve Description Including pain rating scale, medication(s)/side effects and non-pharmacologic comfort measures Outcome: Progressing   Problem: Health Behavior/Discharge Planning: Goal: Ability to manage health-related needs will improve Outcome: Progressing   

## 2020-09-21 NOTE — Progress Notes (Signed)
ANTICOAGULATION CONSULT NOTE - Follow-Up Consult  Pharmacy Consult for Heparin Indication: atrial fibrillation  Allergies  Allergen Reactions   Amiodarone Nausea And Vomiting    Severe nausea   Eliquis [Apixaban] Nausea And Vomiting    Pt's sister remembers she had a N/V reaction when started- reported was really sick    Patient Measurements: Height: 4\' 11"  (149.9 cm) Weight: 86.8 kg (191 lb 5.8 oz) IBW/kg (Calculated) : 43.2 Heparin Dosing Weight: 63 kg  Vital Signs: Temp: 97.9 F (36.6 C) (06/10 0747) Temp Source: Oral (06/10 0747) BP: 90/63 (06/10 0747) Pulse Rate: 116 (06/10 0747)  Labs: Recent Labs    09/20/20 0030 09/20/20 0216 09/20/20 0705 09/20/20 1246 09/20/20 2138 09/21/20 0728  HGB 11.3*  --  11.0*  --   --  11.3*  HCT 37.5  --  36.8  --   --  37.1  PLT 180  --  189  --   --  177  HEPARINUNFRC  --   --   --  0.39 0.24* 0.41  CREATININE 2.91*  --  2.89*  --   --  2.87*  TROPONINIHS 2,897* 2,918*  --   --   --   --      Estimated Creatinine Clearance: 16 mL/min (A) (by C-G formula based on SCr of 2.87 mg/dL (H)).  Assessment: 75 y.o. F presents from [facility] with SOB/volume overload. To begin heparin for afib. Prior records show pt on Eliquis sometime in the past but not recently. CBC ok on admission.  Heparin level this morning is therapeutic at 0.41 on 1050 units/hr. No bleeding noted, CBC is stable.  Goal of Therapy:  Heparin level 0.3-0.7 units/ml Monitor platelets by anticoagulation protocol: Yes   Plan: - Continue heparin drip at 1050 units/hr (10.5 ml/hr) - Daily heparin level and CBC - Monitor for s/sx of bleeding  Thank you for involving pharmacy in this patient's care.  Renold Genta, PharmD, BCPS Clinical Pharmacist Clinical phone for 09/21/2020 until 3p is [phone] 09/21/2020 8:56 AM  **Pharmacist phone directory can be found on [email or web address] listed under [place]**

## 2020-09-21 NOTE — Discharge Summary (Addendum)
Physician Discharge Summary  Melinda Vang YQM:[id number] DOB: [birth year] DOA: 09/19/2020  PCP: Glenda Chroman, MD  Admit date: 09/19/2020 Discharge date: 09/21/2020  Time spent: 60 minutes minutes  Recommendations for Outpatient Follow-up:  Patient will discharge to residential hospice   Discharge Diagnoses:  Principal Problem:   Acute CHF (congestive heart failure) ([place]) Comfort care status Active Problems:   S/P CABG x 4   S/P AVR (aortic valve replacement)   Postoperative atrial fibrillation (HCC)   ARF (acute renal failure) ([place])   Respiratory failure ([place])   Discharge Condition: Stable  Diet recommendation: Comfort food  Filed Weights   09/20/20 0000 09/21/20 0500  Weight: 86.6 kg 86.8 kg    History of present illness:  75 year old female with a history of bioprosthetic aortic valve, aortic stenosis, history of CAD s/p CABG, atrial fibrillation, diabetes mellitus type 2, chronic kidney disease stage III who was recently admitted on June 6 to June 8 for shortness of breath and CHF exacerbation at [facility] and was transferred to [facility] because of worsening shortness of breath, peripheral edema and renal dysfunction.   Lab work at that time showed BNP 21,000, hemoglobin 11.1, 2D echo head showed EF more than 55% with severe aortic stenosis  Hospital Course:   Acute CHF -From severe aortic stenosis of bioprosthetic aortic valve -Started on Lasix infusion -Cardiology has seen the patient and deemed that he is not a good candidate for aggressive interventions -Recommended palliative care consultation for goals of care -Palliative care was consulted; decision made to transfer to residential hospice   Lower extremity cellulitis -Patient was started on ceftriaxone -Ceftriaxone discontinued; patient is comfort measures only         Hyperkalemia -Potassium 5.7 -Was given Lokelma 5 g p.o. x1 yesterday -Potassium this morning is 5.4 -Patient is comfort care  status, no further labs.     Atrial fibrillation -Discontinue Toprol-XL, Eliquis as patient is comfort measures only   Patient will be discharged to residential hospice for end-of-life care.  Procedures: Echocardiogram  Consultations: Cardiology  Discharge Exam: Vitals:   09/21/20 0928 09/21/20 1047  BP: 91/67 93/73  Pulse:    Resp:  20  Temp:    SpO2: 98% 98%    General: Appears in no acute distress Cardiovascular: S1-S2, regular Respiratory-bilateral crackles auscultated  Discharge Instructions   Discharge Instructions     Diet - low sodium heart healthy   Complete by: As directed    Increase activity slowly   Complete by: As directed       Allergies as of 09/21/2020       Reactions   Amiodarone Nausea And Vomiting   Severe nausea   Eliquis [apixaban] Nausea And Vomiting   Pt's sister remembers she had a N/V reaction when started- reported was really sick        Medication List     STOP taking these medications    aspirin EC 81 MG tablet   atorvastatin 20 MG tablet Commonly known as: LIPITOR   furosemide 20 MG tablet Commonly known as: LASIX   glipiZIDE 10 MG 24 hr tablet Commonly known as: GLUCOTROL XL   HYDROcodone bit-homatropine 5-1.5 MG/5ML syrup Commonly known as: HYCODAN   levothyroxine 25 MCG tablet Commonly known as: SYNTHROID   lisinopril 10 MG tablet Commonly known as: ZESTRIL   metoprolol tartrate 25 MG tablet Commonly known as: LOPRESSOR   multivitamin with minerals Tabs tablet   omeprazole 20 MG tablet Commonly known as: PRILOSEC  OTC   potassium chloride 10 MEQ tablet Commonly known as: KLOR-CON   Vitamin D (Ergocalciferol) 1.25 MG (50000 UNIT) Caps capsule Commonly known as: DRISDOL       TAKE these medications    acetaminophen 325 MG tablet Commonly known as: TYLENOL Take 325 mg by mouth every 6 (six) hours as needed.       Allergies  Allergen Reactions   Amiodarone Nausea And Vomiting    Severe  nausea   Eliquis [Apixaban] Nausea And Vomiting    Pt's sister remembers she had a N/V reaction when started- reported was really sick      The results of significant diagnostics from this hospitalization (including imaging, microbiology, ancillary and laboratory) are listed below for reference.    Significant Diagnostic Studies: DG CHEST PORT 1 VIEW  Result Date: 09/20/2020 CLINICAL DATA:  Dyspnea EXAM: PORTABLE CHEST 1 VIEW COMPARISON:  09/17/2020 FINDINGS: Pulmonary insufflation is stable and symmetric. Small left pleural effusion is unchanged. No confluent pulmonary infiltrate. No pneumothorax. Coronary artery bypass grafting and aortic valve replacement have been performed. Mild cardiomegaly is stable. There is enlargement of the central pulmonary arteries in keeping with changes of pulmonary arterial hypertension, unchanged. No superimposed overt pulmonary edema. No acute bone abnormality. IMPRESSION: Stable examination with small left pleural effusion, mild cardiomegaly, and central pulmonary arterial enlargement in keeping with pulmonary artery hypertension. Electronically Signed   By: Fidela Salisbury MD   On: 09/20/2020 00:47   ECHOCARDIOGRAM COMPLETE  Result Date: 09/20/2020    ECHOCARDIOGRAM REPORT   Patient Name:   Melinda Vang Date of Exam: 09/20/2020 Medical Rec #:  [id number]       Height:       59.0 in Accession #:    [id number]      Weight:       190.9 lb Date of Birth:  01-09-1946        BSA:          1.808 m Patient Age:    51 years        BP:           97/67 mmHg Patient Gender: F               HR:           106 bpm. Exam Location:  Inpatient Procedure: 2D Echo, Cardiac Doppler and Color Doppler Indications:    CHF  History:        Patient has prior history of Echocardiogram examinations, most                 recent 09/10/2017. CAD, Aortic Valve Disease; Risk Factors:Former                 Smoker, Hypertension and Dyslipidemia.  Sonographer:    NA Referring Phys: [id number] Patience  Sonographer Comments: Image acquisition challenging due to uncooperative patient. IMPRESSIONS  1. Left ventricular ejection fraction, by estimation, is 40 to 45%. The left ventricle has mildly decreased function. The left ventricle demonstrates regional wall motion abnormalities with basal to mid inferoseptal and inferior severe hypokinesis. There is mild left ventricular hypertrophy. Left ventricular diastolic parameters are indeterminate.  2. Right ventricular systolic function was not well visualized. The right ventricular size is not well visualized. The interventricular septum is D-shaped, suggesting RV pressure/volume overload. No complete TR doppler jet so unable to estimate PA systolic pressure.  3. Left atrial size was mildly dilated.  4. Right atrial size was mildly  dilated.  5. The mitral valve is abnormal. Mild mitral valve regurgitation. No evidence of mitral stenosis. Moderate mitral annular calcification.  6. Bioprosthetic aortic valve. The valve is poorly visualized. Mean gradient is not significantly elevated at 10 mmHg. There is no obvious regurgitation.  7. The IVC is poorly visualized.  8. Technically very difficult study. FINDINGS  Left Ventricle: Left ventricular ejection fraction, by estimation, is 40 to 45%. The left ventricle has mildly decreased function. The left ventricle demonstrates regional wall motion abnormalities. The left ventricular internal cavity size was normal in size. There is mild left ventricular hypertrophy. Left ventricular diastolic parameters are indeterminate. Right Ventricle: The right ventricular size is not well visualized. Right vetricular wall thickness was not well visualized. Right ventricular systolic function was not well visualized. Left Atrium: Left atrial size was mildly dilated. Right Atrium: Right atrial size was mildly dilated. Pericardium: There is no evidence of pericardial effusion. Mitral Valve: The mitral valve is abnormal. There is  moderate calcification of the mitral valve leaflet(s). Moderate mitral annular calcification. Mild mitral valve regurgitation. No evidence of mitral valve stenosis. Tricuspid Valve: The tricuspid valve is not well visualized. Tricuspid valve regurgitation is not demonstrated. Aortic Valve: Bioprosthetic aortic valve. The valve is poorly visualized. Mean gradient is not significantly elevated at 10 mmHg. There is no obvious regurgitation. The aortic valve has been repaired/replaced. Aortic valve regurgitation is not visualized. Aortic valve mean gradient measures 10.0 mmHg. Aortic valve peak gradient measures 17.8 mmHg. Aortic valve area, by VTI measures 0.62 cm. Pulmonic Valve: The pulmonic valve was grossly normal. Pulmonic valve regurgitation is not visualized. Aorta: The aortic root is normal in size and structure. Venous: The inferior vena cava was not well visualized. IAS/Shunts: No atrial level shunt detected by color flow Doppler.  LEFT VENTRICLE PLAX 2D LVIDd:         3.80 cm LVIDs:         3.20 cm LV PW:         1.10 cm LV IVS:        1.30 cm LVOT diam:     1.90 cm LV SV:         25 LV SV Index:   14 LVOT Area:     2.84 cm  LEFT ATRIUM         Index LA diam:    3.70 cm 2.05 cm/m  AORTIC VALVE                    PULMONIC VALVE AV Area (Vmax):    0.79 cm     PV Vmax:       0.85 m/s AV Area (Vmean):   0.78 cm     PV Peak grad:  2.9 mmHg AV Area (VTI):     0.62 cm AV Vmax:           211.00 cm/s AV Vmean:          144.000 cm/s AV VTI:            0.407 m AV Peak Grad:      17.8 mmHg AV Mean Grad:      10.0 mmHg LVOT Vmax:         59.10 cm/s LVOT Vmean:        39.400 cm/s LVOT VTI:          0.088 m LVOT/AV VTI ratio: 0.22  AORTA Ao Root diam: 2.30 cm Ao Asc diam:  3.00 cm  SHUNTS Systemic  VTI:  0.09 m Systemic Diam: 1.90 cm Loralie Champagne MD Electronically signed by Loralie Champagne MD Signature Date/Time: 09/20/2020/3:41:56 PM    Final     Microbiology: Recent Results (from the past 240 hour(s))  SARS  CORONAVIRUS 2 (TAT 6-24 HRS) Nasopharyngeal Nasopharyngeal Swab     Status: None   Collection Time: 09/20/20 12:51 AM   Specimen: Nasopharyngeal Swab  Result Value Ref Range Status   SARS Coronavirus 2 NEGATIVE NEGATIVE Final    Comment: (NOTE) SARS-CoV-2 target nucleic acids are NOT DETECTED.  The SARS-CoV-2 RNA is generally detectable in upper and lower respiratory specimens during the acute phase of infection. Negative results do not preclude SARS-CoV-2 infection, do not rule out co-infections with other pathogens, and should not be used as the sole basis for treatment or other patient management decisions. Negative results must be combined with clinical observations, patient history, and epidemiological information. The expected result is Negative.  Fact Sheet for Patients: SugarRoll.be  Fact Sheet for Healthcare Providers: https://www.woods-mathews.com/  This test is not yet approved or cleared by the [place] FDA and  has been authorized for detection and/or diagnosis of SARS-CoV-2 by FDA under an Emergency Use Authorization (EUA). This EUA will remain  in effect (meaning this test can be used) for the duration of the COVID-19 declaration under Se ction 564(b)(1) of the Act, 21 U.S.C. section 360bbb-3(b)(1), unless the authorization is terminated or revoked sooner.  Performed at [facility], [place] [address], [place], [place] [address]      Labs: Basic Metabolic Panel: Recent Labs  Lab 09/20/20 0030 09/20/20 0705 09/21/20 0728  NA 136 136 136  K 5.1 5.7* 5.4*  CL 108 106 105  CO2 20* 18* 18*  GLUCOSE 81 101* 98  BUN 52* 52* 53*  CREATININE 2.91* 2.89* 2.87*  CALCIUM 9.1 9.0 9.3   Liver Function Tests: Recent Labs  Lab 09/20/20 0030  AST 36  ALT 13  ALKPHOS 93  BILITOT 1.1  PROT 5.6*  ALBUMIN 2.4*   No results for input(s): LIPASE, AMYLASE in the last 168 hours. No results for input(s): AMMONIA in  the last 168 hours. CBC: Recent Labs  Lab 09/20/20 0030 09/20/20 0705 09/21/20 0728  WBC 12.2* 12.6* 12.4*  NEUTROABS 10.0*  --   --   HGB 11.3* 11.0* 11.3*  HCT 37.5 36.8 37.1  MCV 90.4 91.1 89.6  PLT 180 189 177   Cardiac Enzymes: No results for input(s): CKTOTAL, CKMB, CKMBINDEX, TROPONINI in the last 168 hours. BNP: BNP (last 3 results) Recent Labs    09/20/20 0030 09/20/20 0219  BNP 1,052.3* 1,038.3*    ProBNP (last 3 results) No results for input(s): PROBNP in the last 8760 hours.  CBG: Recent Labs  Lab 09/20/20 1429 09/20/20 1637 09/20/20 2118 09/21/20 0557 09/21/20 1046  GLUCAP 132* 117* 108* 98 121*       Signed:  Oswald Hillock MD.  [facility] 09/21/2020, 11:36 AM

## 2020-09-21 NOTE — Progress Notes (Addendum)
Progress Note  Patient Name: Melinda Vang Date of Encounter: 09/21/2020  [facility] Cardiologist: Rozann Lesches, MD   Subjective   Pt seen sitting up in bed with increased work of breathing.  Inpatient Medications    Scheduled Meds:  levothyroxine  50 mcg Oral Q0600   metoprolol succinate  25 mg Oral Daily   Continuous Infusions:  PRN Meds: glycopyrrolate, LORazepam **OR** LORazepam **OR** LORazepam, morphine CONCENTRATE **OR** morphine CONCENTRATE   Vital Signs    Vitals:   09/21/20 0500 09/21/20 0747 09/21/20 0928 09/21/20 1047  BP:  90/63 91/67 93/73   Pulse:  (!) 116    Resp:  20  20  Temp:  97.9 F (36.6 C)    TempSrc:  Oral    SpO2:  94% 98% 98%  Weight: 86.8 kg     Height:        Intake/Output Summary (Last 24 hours) at 09/21/2020 1137 Last data filed at 09/21/2020 8119 Gross per 24 hour  Intake 1243.61 ml  Output 1250 ml  Net -6.39 ml   Last 3 Weights 09/21/2020 09/20/2020 08/24/2019  Weight (lbs) 191 lb 5.8 oz 190 lb 14.7 oz 170 lb  Weight (kg) 86.8 kg 86.6 kg 77.111 kg      Telemetry    Afib with ventricular rate 120s - Personally Reviewed  ECG    No new tracings - Personally Reviewed  Physical Exam   GEN: No mild respiratory distress.   Respiratory: wheezing throughout MS: edema to chest. Neuro:  Nonfocal , although speech is soft Psych: Normal affect   Labs    High Sensitivity Troponin:   Recent Labs  Lab 09/20/20 0030 09/20/20 0216  TROPONINIHS 2,897* 2,918*      Chemistry Recent Labs  Lab 09/20/20 0030 09/20/20 0705 09/21/20 0728  NA 136 136 136  K 5.1 5.7* 5.4*  CL 108 106 105  CO2 20* 18* 18*  GLUCOSE 81 101* 98  BUN 52* 52* 53*  CREATININE 2.91* 2.89* 2.87*  CALCIUM 9.1 9.0 9.3  PROT 5.6*  --   --   ALBUMIN 2.4*  --   --   AST 36  --   --   ALT 13  --   --   ALKPHOS 93  --   --   BILITOT 1.1  --   --   GFRNONAA 16* 16* 16*  ANIONGAP 8 12 13      Hematology Recent Labs  Lab 09/20/20 0030  09/20/20 0705 09/21/20 0728  WBC 12.2* 12.6* 12.4*  RBC 4.15 4.04 4.14  HGB 11.3* 11.0* 11.3*  HCT 37.5 36.8 37.1  MCV 90.4 91.1 89.6  MCH 27.2 27.2 27.3  MCHC 30.1 29.9* 30.5  RDW 17.5* 17.8* 17.9*  PLT 180 189 177    BNP Recent Labs  Lab 09/20/20 0030 09/20/20 0219  BNP 1,052.3* 1,038.3*     DDimer No results for input(s): DDIMER in the last 168 hours.   Radiology    DG CHEST PORT 1 VIEW  Result Date: 09/20/2020 CLINICAL DATA:  Dyspnea EXAM: PORTABLE CHEST 1 VIEW COMPARISON:  09/17/2020 FINDINGS: Pulmonary insufflation is stable and symmetric. Small left pleural effusion is unchanged. No confluent pulmonary infiltrate. No pneumothorax. Coronary artery bypass grafting and aortic valve replacement have been performed. Mild cardiomegaly is stable. There is enlargement of the central pulmonary arteries in keeping with changes of pulmonary arterial hypertension, unchanged. No superimposed overt pulmonary edema. No acute bone abnormality. IMPRESSION: Stable examination with small left pleural effusion,  mild cardiomegaly, and central pulmonary arterial enlargement in keeping with pulmonary artery hypertension. Electronically Signed   By: Fidela Salisbury MD   On: 09/20/2020 00:47   ECHOCARDIOGRAM COMPLETE  Result Date: 09/20/2020    ECHOCARDIOGRAM REPORT   Patient Name:   Melinda Vang Date of Exam: 09/20/2020 Medical Rec #:  [id number]       Height:       59.0 in Accession #:    [id number]      Weight:       190.9 lb Date of Birth:  Oct 07, 1945        BSA:          1.808 m Patient Age:    75 years        BP:           97/67 mmHg Patient Gender: F               HR:           106 bpm. Exam Location:  Inpatient Procedure: 2D Echo, Cardiac Doppler and Color Doppler Indications:    CHF  History:        Patient has prior history of Echocardiogram examinations, most                 recent 09/10/2017. CAD, Aortic Valve Disease; Risk Factors:Former                 Smoker, Hypertension and Dyslipidemia.   Sonographer:    NA Referring Phys: [id number] Patience  Sonographer Comments: Image acquisition challenging due to uncooperative patient. IMPRESSIONS  1. Left ventricular ejection fraction, by estimation, is 40 to 45%. The left ventricle has mildly decreased function. The left ventricle demonstrates regional wall motion abnormalities with basal to mid inferoseptal and inferior severe hypokinesis. There is mild left ventricular hypertrophy. Left ventricular diastolic parameters are indeterminate.  2. Right ventricular systolic function was not well visualized. The right ventricular size is not well visualized. The interventricular septum is D-shaped, suggesting RV pressure/volume overload. No complete TR doppler jet so unable to estimate PA systolic pressure.  3. Left atrial size was mildly dilated.  4. Right atrial size was mildly dilated.  5. The mitral valve is abnormal. Mild mitral valve regurgitation. No evidence of mitral stenosis. Moderate mitral annular calcification.  6. Bioprosthetic aortic valve. The valve is poorly visualized. Mean gradient is not significantly elevated at 10 mmHg. There is no obvious regurgitation.  7. The IVC is poorly visualized.  8. Technically very difficult study. FINDINGS  Left Ventricle: Left ventricular ejection fraction, by estimation, is 40 to 45%. The left ventricle has mildly decreased function. The left ventricle demonstrates regional wall motion abnormalities. The left ventricular internal cavity size was normal in size. There is mild left ventricular hypertrophy. Left ventricular diastolic parameters are indeterminate. Right Ventricle: The right ventricular size is not well visualized. Right vetricular wall thickness was not well visualized. Right ventricular systolic function was not well visualized. Left Atrium: Left atrial size was mildly dilated. Right Atrium: Right atrial size was mildly dilated. Pericardium: There is no evidence of pericardial effusion. Mitral  Valve: The mitral valve is abnormal. There is moderate calcification of the mitral valve leaflet(s). Moderate mitral annular calcification. Mild mitral valve regurgitation. No evidence of mitral valve stenosis. Tricuspid Valve: The tricuspid valve is not well visualized. Tricuspid valve regurgitation is not demonstrated. Aortic Valve: Bioprosthetic aortic valve. The valve is poorly visualized. Mean gradient is not significantly elevated at 10  mmHg. There is no obvious regurgitation. The aortic valve has been repaired/replaced. Aortic valve regurgitation is not visualized. Aortic valve mean gradient measures 10.0 mmHg. Aortic valve peak gradient measures 17.8 mmHg. Aortic valve area, by VTI measures 0.62 cm. Pulmonic Valve: The pulmonic valve was grossly normal. Pulmonic valve regurgitation is not visualized. Aorta: The aortic root is normal in size and structure. Venous: The inferior vena cava was not well visualized. IAS/Shunts: No atrial level shunt detected by color flow Doppler.  LEFT VENTRICLE PLAX 2D LVIDd:         3.80 cm LVIDs:         3.20 cm LV PW:         1.10 cm LV IVS:        1.30 cm LVOT diam:     1.90 cm LV SV:         25 LV SV Index:   14 LVOT Area:     2.84 cm  LEFT ATRIUM         Index LA diam:    3.70 cm 2.05 cm/m  AORTIC VALVE                    PULMONIC VALVE AV Area (Vmax):    0.79 cm     PV Vmax:       0.85 m/s AV Area (Vmean):   0.78 cm     PV Peak grad:  2.9 mmHg AV Area (VTI):     0.62 cm AV Vmax:           211.00 cm/s AV Vmean:          144.000 cm/s AV VTI:            0.407 m AV Peak Grad:      17.8 mmHg AV Mean Grad:      10.0 mmHg LVOT Vmax:         59.10 cm/s LVOT Vmean:        39.400 cm/s LVOT VTI:          0.088 m LVOT/AV VTI ratio: 0.22  AORTA Ao Root diam: 2.30 cm Ao Asc diam:  3.00 cm  SHUNTS Systemic VTI:  0.09 m Systemic Diam: 1.90 cm Loralie Champagne MD Electronically signed by Loralie Champagne MD Signature Date/Time: 09/20/2020/3:41:56 PM    Final     Cardiac Studies   Echo  09/20/20:  1. Left ventricular ejection fraction, by estimation, is 40 to 45%. The  left ventricle has mildly decreased function. The left ventricle  demonstrates regional wall motion abnormalities with basal to mid  inferoseptal and inferior severe hypokinesis.  There is mild left ventricular hypertrophy. Left ventricular diastolic  parameters are indeterminate.   2. Right ventricular systolic function was not well visualized. The right  ventricular size is not well visualized. The interventricular septum is  D-shaped, suggesting RV pressure/volume overload. No complete TR doppler  jet so unable to estimate PA  systolic pressure.   3. Left atrial size was mildly dilated.   4. Right atrial size was mildly dilated.   5. The mitral valve is abnormal. Mild mitral valve regurgitation. No  evidence of mitral stenosis. Moderate mitral annular calcification.   6. Bioprosthetic aortic valve. The valve is poorly visualized. Mean  gradient is not significantly elevated at 10 mmHg. There is no obvious  regurgitation.   7. The IVC is poorly visualized.   8. Technically very difficult study.    Echo 09/10/17: Study Conclusions   -  Left ventricle: The cavity size was normal. Wall thickness was    normal. Systolic function was vigorous. The estimated ejection    fraction was in the range of 65% to 70%. Features are consistent    with a pseudonormal left ventricular filling pattern, with    concomitant abnormal relaxation and increased filling pressure    (grade 2 diastolic dysfunction). Doppler parameters are    consistent with high ventricular filling pressure.  - Aortic valve: Mean gradient (S): 23 mm Hg. Mean gradient within    normal reported range for this prosthetic valve.  - Atrial septum: No defect or patent foramen ovale was identified.  - Technically difficult study.   Patient Profile     75 y.o. female with severe AS s/p BP AVR (2013), CABG (2013, SVG-LAD, SVG-OM/LPMB, SVG-LPDA),  HTN, pAF, DM2, and developmental delay who presents with worsening SOB and volume overload in the context of BP AVR stenosis.  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 Family at bedside. Pt wishes to go home instead of inpatient hospice - family expresses concern about taking care of her 24/7.     For questions or updates, please contact [place] Please consult [email or web address] for contact info under        Signed, Ledora Bottcher, PA  09/21/2020, 11:37 AM     Attending Note:   The patient was seen and examined.  Agree with assessment and plan as noted above.  Changes made to the above note as needed.  Patient seen and independently examined with Doreene Adas, PA .   We discussed all aspects of the encounter. I agree with the assessment and plan as stated above.    Acute on chronic systolic and diastolic congestive heart failure: She has significant wheezing.  Unfortunately, we really do not have any good options for treatment.  She is also been atrial fibrillation with a rapid ventricular response.  I agree with inpatient hospice for comfort care.   I have spent a total of 40 minutes with patient reviewing  hospital  notes , telemetry, EKGs, labs and examining patient as well as establishing an assessment and plan that was discussed with the patient.  > 50% of time was spent in direct patient care.    Thayer Headings, Brooke Bonito., MD, [facility] 09/21/2020, 1:18 PM [address],  [place] Pager [phone]

## 2020-09-21 NOTE — TOC Transition Note (Signed)
Transition of Care [facility]) - CM/SW Discharge Note   Patient Details  Name: Melinda Vang MRN: [id number] Date of Birth: Apr 23, 1945  Transition of Care [facility]) CM/SW Contact:  Trula Ore, [place] Phone Number: 09/21/2020, 11:54 AM   Clinical Narrative:     Patient will DC to: [facility]  Anticipated DC date: 09/21/2020  Family notified: Marcie Bal  Transport by: Corey Harold  ?  Per MD patient ready for DC to [facility]. RN, patient, patient's family, and Cassandra with [facility] notified of DC. RN given number for report tele# [phone]. DC packet on chart. DNR signed by MD attached to patients dc packet.Ambulance transport requested for patient.  CSW signing off.   Final next level of care: [facility] [place]) Barriers to Discharge: No Barriers Identified   Patient Goals and CMS Choice   CMS Medicare.gov Compare Post Acute Care list provided to:: Patient Represenative (must comment) Marcie Bal patients sister) Choice offered to / list presented to : Sibling Marcie Bal)  Discharge Placement              Patient chooses bed at:  [facility] [place]) Patient to be transferred to facility by: [place] Name of family member notified: Marcie Bal Patient and family notified of of transfer: 09/21/20  Discharge Plan and Services                                     Social Determinants of Health (SDOH) Interventions     Readmission Risk Interventions No flowsheet data found.

## 2020-09-21 NOTE — TOC Progression Note (Signed)
Transition of Care [facility]) - Progression Note    Patient Details  Name: Melinda Vang MRN: [id number] Date of Birth: November 28, 1945  Transition of Care [facility]) CM/SW [place], [place] Phone Number: 09/21/2020, 11:35 AM  Clinical Narrative:     CSW received call from Iola with [facility] who confirmed that they had a bed for patient today. CSW informed MD and patients sister Marcie Bal. CSW will continue to follow and assist with discharge planning needs.       Expected Discharge Plan and Services                                                 Social Determinants of Health (SDOH) Interventions    Readmission Risk Interventions No flowsheet data found.

## 2020-09-22 NOTE — Progress Notes (Signed)
The patient is picked up to [facility] by [facility] transporters. Denies any acute pain. Noted SOB but refused to wear oxygen.

## 2020-11-12 DEATH — deceased
